# Patient Record
Sex: Male | Born: 1940 | Race: White | Hispanic: No | Smoking: Former smoker
Health system: Southern US, Community
[De-identification: ages and names within clinical notes are randomized; demographics above are authoritative.]

## PROBLEM LIST (undated history)

## (undated) DIAGNOSIS — K635 Polyp of colon: Secondary | ICD-10-CM

## (undated) DIAGNOSIS — I77810 Thoracic aortic ectasia: Secondary | ICD-10-CM

## (undated) DIAGNOSIS — K219 Gastro-esophageal reflux disease without esophagitis: Secondary | ICD-10-CM

## (undated) DIAGNOSIS — H409 Unspecified glaucoma: Secondary | ICD-10-CM

## (undated) DIAGNOSIS — E785 Hyperlipidemia, unspecified: Secondary | ICD-10-CM

## (undated) DIAGNOSIS — R55 Syncope and collapse: Secondary | ICD-10-CM

## (undated) DIAGNOSIS — T4145XA Adverse effect of unspecified anesthetic, initial encounter: Secondary | ICD-10-CM

## (undated) DIAGNOSIS — K5792 Diverticulitis of intestine, part unspecified, without perforation or abscess without bleeding: Secondary | ICD-10-CM

## (undated) DIAGNOSIS — R7301 Impaired fasting glucose: Secondary | ICD-10-CM

## (undated) DIAGNOSIS — N400 Enlarged prostate without lower urinary tract symptoms: Secondary | ICD-10-CM

## (undated) DIAGNOSIS — T8859XA Other complications of anesthesia, initial encounter: Secondary | ICD-10-CM

## (undated) DIAGNOSIS — T7840XA Allergy, unspecified, initial encounter: Secondary | ICD-10-CM

## (undated) DIAGNOSIS — I10 Essential (primary) hypertension: Secondary | ICD-10-CM

## (undated) DIAGNOSIS — I2510 Atherosclerotic heart disease of native coronary artery without angina pectoris: Secondary | ICD-10-CM

## (undated) DIAGNOSIS — R001 Bradycardia, unspecified: Secondary | ICD-10-CM

## (undated) DIAGNOSIS — G4730 Sleep apnea, unspecified: Secondary | ICD-10-CM

## (undated) HISTORY — DX: Syncope and collapse: R55

## (undated) HISTORY — DX: Gastro-esophageal reflux disease without esophagitis: K21.9

## (undated) HISTORY — DX: Essential (primary) hypertension: I10

## (undated) HISTORY — DX: Sleep apnea, unspecified: G47.30

## (undated) HISTORY — DX: Atherosclerotic heart disease of native coronary artery without angina pectoris: I25.10

## (undated) HISTORY — PX: PILONIDAL CYST / SINUS EXCISION: SUR543

## (undated) HISTORY — DX: Bradycardia, unspecified: R00.1

## (undated) HISTORY — DX: Benign prostatic hyperplasia without lower urinary tract symptoms: N40.0

## (undated) HISTORY — DX: Impaired fasting glucose: R73.01

## (undated) HISTORY — DX: Polyp of colon: K63.5

## (undated) HISTORY — DX: Diverticulitis of intestine, part unspecified, without perforation or abscess without bleeding: K57.92

## (undated) HISTORY — DX: Allergy, unspecified, initial encounter: T78.40XA

## (undated) HISTORY — DX: Hyperlipidemia, unspecified: E78.5

## (undated) HISTORY — PX: INTRAOCULAR LENS INSERTION: SHX110

## (undated) HISTORY — PX: ANGIOPLASTY: SHX39

## (undated) HISTORY — DX: Unspecified glaucoma: H40.9

## (undated) HISTORY — PX: COLONOSCOPY: SHX174

---

## 2001-06-08 ENCOUNTER — Ambulatory Visit (HOSPITAL_COMMUNITY): Admission: RE | Admit: 2001-06-08 | Discharge: 2001-06-08 | Payer: Self-pay | Admitting: Gastroenterology

## 2001-06-08 ENCOUNTER — Encounter (INDEPENDENT_AMBULATORY_CARE_PROVIDER_SITE_OTHER): Payer: Self-pay | Admitting: *Deleted

## 2003-08-15 ENCOUNTER — Encounter: Admission: RE | Admit: 2003-08-15 | Discharge: 2003-08-15 | Payer: Self-pay | Admitting: Gastroenterology

## 2004-06-11 ENCOUNTER — Ambulatory Visit: Payer: Self-pay | Admitting: Internal Medicine

## 2004-06-18 ENCOUNTER — Ambulatory Visit: Payer: Self-pay | Admitting: Internal Medicine

## 2004-06-25 ENCOUNTER — Ambulatory Visit: Payer: Self-pay | Admitting: Internal Medicine

## 2004-07-02 ENCOUNTER — Ambulatory Visit: Payer: Self-pay | Admitting: *Deleted

## 2004-07-02 ENCOUNTER — Ambulatory Visit: Payer: Self-pay | Admitting: Internal Medicine

## 2004-07-02 ENCOUNTER — Ambulatory Visit: Payer: Self-pay

## 2004-07-02 ENCOUNTER — Inpatient Hospital Stay (HOSPITAL_COMMUNITY): Admission: RE | Admit: 2004-07-02 | Discharge: 2004-07-04 | Payer: Self-pay | Admitting: *Deleted

## 2004-07-09 ENCOUNTER — Ambulatory Visit: Payer: Self-pay | Admitting: Internal Medicine

## 2004-08-09 ENCOUNTER — Ambulatory Visit: Payer: Self-pay | Admitting: Internal Medicine

## 2004-11-19 ENCOUNTER — Ambulatory Visit: Payer: Self-pay | Admitting: Internal Medicine

## 2004-11-26 ENCOUNTER — Ambulatory Visit: Payer: Self-pay | Admitting: Internal Medicine

## 2005-01-28 ENCOUNTER — Ambulatory Visit: Payer: Self-pay | Admitting: Internal Medicine

## 2005-02-25 ENCOUNTER — Ambulatory Visit: Payer: Self-pay | Admitting: Internal Medicine

## 2005-03-22 ENCOUNTER — Ambulatory Visit: Payer: Self-pay | Admitting: Cardiology

## 2005-03-22 ENCOUNTER — Ambulatory Visit: Payer: Self-pay

## 2005-08-23 ENCOUNTER — Ambulatory Visit: Payer: Self-pay | Admitting: Internal Medicine

## 2005-08-29 ENCOUNTER — Ambulatory Visit: Payer: Self-pay | Admitting: Internal Medicine

## 2006-02-26 ENCOUNTER — Ambulatory Visit: Payer: Self-pay | Admitting: Internal Medicine

## 2006-03-05 ENCOUNTER — Ambulatory Visit: Payer: Self-pay

## 2006-07-03 ENCOUNTER — Ambulatory Visit: Payer: Self-pay | Admitting: Internal Medicine

## 2006-07-03 LAB — CONVERTED CEMR LAB
Albumin: 4.3 g/dL (ref 3.5–5.2)
Alkaline Phosphatase: 81 units/L (ref 39–117)
BUN: 23 mg/dL (ref 6–23)
CO2: 29 meq/L (ref 19–32)
Calcium: 9.5 mg/dL (ref 8.4–10.5)
Chloride: 104 meq/L (ref 96–112)
Creatinine, Ser: 1 mg/dL (ref 0.4–1.5)
Glucose, Bld: 113 mg/dL — ABNORMAL HIGH (ref 70–99)
PSA: 0.64 ng/mL (ref 0.10–4.00)
Total Bilirubin: 0.8 mg/dL (ref 0.3–1.2)
Total CHOL/HDL Ratio: 3
Triglycerides: 90 mg/dL (ref 0–149)
VLDL: 18 mg/dL (ref 0–40)

## 2006-08-29 ENCOUNTER — Ambulatory Visit: Payer: Self-pay | Admitting: Internal Medicine

## 2006-09-05 ENCOUNTER — Ambulatory Visit: Payer: Self-pay | Admitting: Internal Medicine

## 2006-09-05 LAB — CONVERTED CEMR LAB
BUN: 26 mg/dL — ABNORMAL HIGH (ref 6–23)
CO2: 30 meq/L (ref 19–32)
Calcium: 9.3 mg/dL (ref 8.4–10.5)
Chloride: 108 meq/L (ref 96–112)
Creatinine, Ser: 1.1 mg/dL (ref 0.4–1.5)
Hgb A1c MFr Bld: 5.8 % (ref 4.6–6.0)
Potassium: 4.4 meq/L (ref 3.5–5.1)

## 2007-01-09 ENCOUNTER — Ambulatory Visit: Payer: Self-pay | Admitting: Internal Medicine

## 2007-01-09 DIAGNOSIS — I2510 Atherosclerotic heart disease of native coronary artery without angina pectoris: Secondary | ICD-10-CM | POA: Insufficient documentation

## 2007-01-09 DIAGNOSIS — Z860100 Personal history of colon polyps, unspecified: Secondary | ICD-10-CM | POA: Insufficient documentation

## 2007-01-09 DIAGNOSIS — N4889 Other specified disorders of penis: Secondary | ICD-10-CM | POA: Insufficient documentation

## 2007-01-09 DIAGNOSIS — J309 Allergic rhinitis, unspecified: Secondary | ICD-10-CM | POA: Insufficient documentation

## 2007-01-09 DIAGNOSIS — N400 Enlarged prostate without lower urinary tract symptoms: Secondary | ICD-10-CM | POA: Insufficient documentation

## 2007-01-09 DIAGNOSIS — K5730 Diverticulosis of large intestine without perforation or abscess without bleeding: Secondary | ICD-10-CM | POA: Insufficient documentation

## 2007-01-09 DIAGNOSIS — E785 Hyperlipidemia, unspecified: Secondary | ICD-10-CM | POA: Insufficient documentation

## 2007-01-09 DIAGNOSIS — I10 Essential (primary) hypertension: Secondary | ICD-10-CM | POA: Insufficient documentation

## 2007-01-09 DIAGNOSIS — K219 Gastro-esophageal reflux disease without esophagitis: Secondary | ICD-10-CM | POA: Insufficient documentation

## 2007-01-09 LAB — CONVERTED CEMR LAB
ALT: 21 U/L
Albumin: 4.1 g/dL
BUN: 20 mg/dL
CO2: 29 meq/L
Calcium: 9.7 mg/dL
Chloride: 106 meq/L
Cholesterol: 128 mg/dL
Creatinine, Ser: 1 mg/dL
GFR calc Af Amer: 96 mL/min
GFR calc non Af Amer: 79 mL/min
Glucose, Bld: 103 mg/dL — ABNORMAL HIGH
HDL: 39.5 mg/dL
LDL Cholesterol: 74 mg/dL
Phosphorus: 3.3 mg/dL
Potassium: 4.6 meq/L
Sodium: 141 meq/L
Total CHOL/HDL Ratio: 3.2
Triglycerides: 71 mg/dL
VLDL: 14 mg/dL

## 2007-02-12 ENCOUNTER — Ambulatory Visit: Payer: Self-pay | Admitting: Internal Medicine

## 2007-02-19 ENCOUNTER — Ambulatory Visit: Payer: Self-pay

## 2007-02-26 ENCOUNTER — Encounter: Payer: Self-pay | Admitting: Internal Medicine

## 2007-07-31 ENCOUNTER — Ambulatory Visit: Payer: Self-pay | Admitting: Internal Medicine

## 2007-07-31 DIAGNOSIS — R7301 Impaired fasting glucose: Secondary | ICD-10-CM | POA: Insufficient documentation

## 2007-07-31 LAB — CONVERTED CEMR LAB
Blood in Urine, dipstick: NEGATIVE
Ketones, urine, test strip: NEGATIVE
Urobilinogen, UA: 0.2
WBC Urine, dipstick: NEGATIVE

## 2007-10-19 ENCOUNTER — Ambulatory Visit: Payer: Self-pay | Admitting: Internal Medicine

## 2007-10-21 LAB — CONVERTED CEMR LAB
BUN: 28 mg/dL — ABNORMAL HIGH (ref 6–23)
Basophils Absolute: 0 10*3/uL (ref 0.0–0.1)
Bilirubin, Direct: 0.1 mg/dL (ref 0.0–0.3)
CO2: 28 meq/L (ref 19–32)
Chloride: 100 meq/L (ref 96–112)
Eosinophils Absolute: 0.2 10*3/uL (ref 0.0–0.7)
GFR calc Af Amer: 86 mL/min
Glucose, Bld: 107 mg/dL — ABNORMAL HIGH (ref 70–99)
HCT: 40.7 % (ref 39.0–52.0)
HDL: 36.2 mg/dL — ABNORMAL LOW (ref >39.0)
MCV: 92.3 fL (ref 78.0–100.0)
Monocytes Absolute: 0.5 10*3/uL (ref 0.1–1.0)
PSA: 0.57 ng/mL (ref 0.10–4.00)
Platelets: 280 10*3/uL (ref 150–400)
Potassium: 4 meq/L (ref 3.5–5.1)
RDW: 12.9 % (ref 11.5–14.6)
TSH: 0.81 microintl units/mL (ref 0.35–5.50)
Total Bilirubin: 0.8 mg/dL (ref 0.3–1.2)
Total CHOL/HDL Ratio: 5.5
Triglycerides: 70 mg/dL (ref 0–149)
VLDL: 14 mg/dL (ref 0–40)

## 2007-11-20 ENCOUNTER — Ambulatory Visit: Payer: Self-pay | Admitting: Internal Medicine

## 2007-11-22 LAB — CONVERTED CEMR LAB
ALT: 22 units/L (ref 0–53)
AST: 21 units/L (ref 0–37)
Cholesterol: 185 mg/dL (ref 0–200)
HDL: 33.1 mg/dL — ABNORMAL LOW (ref >39.0)
Total Bilirubin: 0.7 mg/dL (ref 0.3–1.2)
Total CK: 86 units/L (ref 7–195)
Triglycerides: 112 mg/dL (ref 0–149)

## 2008-03-07 ENCOUNTER — Ambulatory Visit: Payer: Self-pay | Admitting: Internal Medicine

## 2008-04-25 ENCOUNTER — Ambulatory Visit: Payer: Self-pay | Admitting: Internal Medicine

## 2008-04-25 DIAGNOSIS — L219 Seborrheic dermatitis, unspecified: Secondary | ICD-10-CM | POA: Insufficient documentation

## 2008-04-27 LAB — CONVERTED CEMR LAB
ALT: 26 units/L (ref 0–53)
BUN: 28 mg/dL — ABNORMAL HIGH (ref 6–23)
Bilirubin, Direct: 0.1 mg/dL (ref 0.0–0.3)
Chloride: 102 meq/L (ref 96–112)
Glucose, Bld: 88 mg/dL (ref 70–99)
Phosphorus: 2.9 mg/dL (ref 2.3–4.6)
Potassium: 4 meq/L (ref 3.5–5.1)
Total Bilirubin: 0.9 mg/dL (ref 0.3–1.2)
VLDL: 18 mg/dL (ref 0–40)

## 2008-10-28 ENCOUNTER — Ambulatory Visit: Payer: Self-pay | Admitting: Internal Medicine

## 2008-10-31 LAB — CONVERTED CEMR LAB
AST: 24 units/L (ref 0–37)
Alkaline Phosphatase: 73 units/L (ref 39–117)
Basophils Relative: 0.7 % (ref 0.0–3.0)
CO2: 30 meq/L (ref 19–32)
Calcium: 9.8 mg/dL (ref 8.4–10.5)
Chloride: 103 meq/L (ref 96–112)
Eosinophils Absolute: 0.2 10*3/uL (ref 0.0–0.7)
HCT: 42.9 % (ref 39.0–52.0)
HDL: 42 mg/dL (ref >39.00)
Hemoglobin: 15 g/dL (ref 13.0–17.0)
LDL Cholesterol: 119 mg/dL — ABNORMAL HIGH (ref 0–99)
MCHC: 35.1 g/dL (ref 30.0–36.0)
MCV: 93.5 fL (ref 78.0–100.0)
Monocytes Absolute: 0.6 10*3/uL (ref 0.1–1.0)
Neutro Abs: 2.9 10*3/uL (ref 1.4–7.7)
Potassium: 4.4 meq/L (ref 3.5–5.1)
RBC: 4.59 M/uL (ref 4.22–5.81)
Sodium: 141 meq/L (ref 135–145)
Total Bilirubin: 1 mg/dL (ref 0.3–1.2)
Total CHOL/HDL Ratio: 4

## 2008-11-24 ENCOUNTER — Encounter: Payer: Self-pay | Admitting: Internal Medicine

## 2008-11-25 ENCOUNTER — Ambulatory Visit: Payer: Self-pay | Admitting: Internal Medicine

## 2009-01-09 ENCOUNTER — Encounter: Payer: Self-pay | Admitting: Internal Medicine

## 2009-02-24 ENCOUNTER — Ambulatory Visit: Payer: Self-pay | Admitting: Internal Medicine

## 2009-06-02 ENCOUNTER — Ambulatory Visit: Payer: Self-pay | Admitting: Internal Medicine

## 2009-06-05 LAB — CONVERTED CEMR LAB
Albumin: 4.5 g/dL (ref 3.5–5.2)
Alkaline Phosphatase: 86 units/L (ref 39–117)
Basophils Relative: 0.3 % (ref 0.0–3.0)
CO2: 28 meq/L (ref 19–32)
Chloride: 104 meq/L (ref 96–112)
Creatinine, Ser: 1.1 mg/dL (ref 0.4–1.5)
Eosinophils Relative: 2.3 % (ref 0.0–5.0)
GFR calc non Af Amer: 70.64 mL/min (ref >60)
HCT: 45.6 % (ref 39.0–52.0)
Hemoglobin: 14.9 g/dL (ref 13.0–17.0)
LDL Cholesterol: 116 mg/dL — ABNORMAL HIGH (ref 0–99)
Lymphs Abs: 1.6 10*3/uL (ref 0.7–4.0)
MCV: 96.5 fL (ref 78.0–100.0)
Monocytes Absolute: 0.6 10*3/uL (ref 0.1–1.0)
Monocytes Relative: 8.1 % (ref 3.0–12.0)
Neutro Abs: 4.5 10*3/uL (ref 1.4–7.7)
Platelets: 254 10*3/uL (ref 150.0–400.0)
Potassium: 4 meq/L (ref 3.5–5.1)
RBC: 4.72 M/uL (ref 4.22–5.81)
Total CHOL/HDL Ratio: 4
Triglycerides: 90 mg/dL (ref 0.0–149.0)
VLDL: 18 mg/dL (ref 0.0–40.0)
WBC: 6.9 10*3/uL (ref 4.5–10.5)

## 2009-12-07 ENCOUNTER — Encounter: Payer: Self-pay | Admitting: Internal Medicine

## 2009-12-08 ENCOUNTER — Ambulatory Visit: Payer: Self-pay | Admitting: Internal Medicine

## 2009-12-11 LAB — CONVERTED CEMR LAB
Albumin: 4.3 g/dL (ref 3.5–5.2)
Alkaline Phosphatase: 83 units/L (ref 39–117)
BUN: 22 mg/dL (ref 6–23)
Basophils Relative: 0.6 % (ref 0.0–3.0)
Bilirubin, Direct: 0.1 mg/dL (ref 0.0–0.3)
CO2: 29 meq/L (ref 19–32)
Calcium: 9 mg/dL (ref 8.4–10.5)
Chloride: 99 meq/L (ref 96–112)
Creatinine, Ser: 1 mg/dL (ref 0.4–1.5)
Eosinophils Absolute: 0.2 10*3/uL (ref 0.0–0.7)
GFR calc non Af Amer: 75.25 mL/min (ref >60)
HDL: 45.8 mg/dL (ref >39.00)
Hemoglobin: 14.7 g/dL (ref 13.0–17.0)
Hgb A1c MFr Bld: 5.9 % (ref 4.6–6.5)
LDL Cholesterol: 130 mg/dL — ABNORMAL HIGH (ref 0–99)
Lymphocytes Relative: 24.6 % (ref 12.0–46.0)
MCHC: 34.2 g/dL (ref 30.0–36.0)
Neutrophils Relative %: 59.7 % (ref 43.0–77.0)
Total CHOL/HDL Ratio: 4
Total Protein: 7.2 g/dL (ref 6.0–8.3)
VLDL: 13.2 mg/dL (ref 0.0–40.0)

## 2009-12-12 ENCOUNTER — Telehealth (INDEPENDENT_AMBULATORY_CARE_PROVIDER_SITE_OTHER): Payer: Self-pay | Admitting: Radiology

## 2009-12-13 ENCOUNTER — Ambulatory Visit: Payer: Self-pay

## 2009-12-13 ENCOUNTER — Ambulatory Visit: Payer: Self-pay | Admitting: Cardiovascular Disease

## 2009-12-13 ENCOUNTER — Encounter: Payer: Self-pay | Admitting: Cardiovascular Disease

## 2009-12-13 ENCOUNTER — Encounter (HOSPITAL_COMMUNITY): Admission: RE | Admit: 2009-12-13 | Discharge: 2010-01-30 | Payer: Self-pay | Admitting: Internal Medicine

## 2009-12-25 ENCOUNTER — Ambulatory Visit: Payer: Self-pay | Admitting: Internal Medicine

## 2010-01-01 ENCOUNTER — Telehealth: Payer: Self-pay | Admitting: Internal Medicine

## 2010-06-12 NOTE — Assessment & Plan Note (Signed)
Summary: f69m  Medications Added AMLODIPINE BESYLATE 5 MG TABS (AMLODIPINE BESYLATE) Take one tablet by mouth daily ZETIA 10 MG TABS (EZETIMIBE) Take one tablet by mouth daily.      Allergies Added:   Visit Type:  Follow-up Primary Provider:  Cindee Salt MD  CC:  SOB.  History of Present Illness: Budd is a delightful 70 year old male with a history of coronary artery disease status post stenting of the right coronary artery and PDA with Taxus drug-eluting stents in 2006.  He also has a history of hypertension, hyperlipidemia, and glucose intolerance. He is unable tolerate beta-blockers due to symptomatic bradycardia.    He had ETT/Myoview last week for SOB and chest tightness. Walked 12:37. EF 57% No ischemia or scar.  Returns today for routine f/u. Saw Dr. Alphonsus Sias recently c/o CP. Had Myoview as above - normal.   Walking 3-4 miles regularly without problem. 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 Feels chest stuffiness may be related to dust inhalation. No problems with medicine.   CoQ10 helping him tolerate statin. Recent lipids LDL 130 (up from 119) HDL 46. Pravastatin doubled last year. Unable to tolerate higher potency statins. Not watching diet closely.  Current Medications (verified): 1)  Lisinopril-Hydrochlorothiazide 20-12.5 Mg  Tabs (Lisinopril-Hydrochlorothiazide) .... Take 2 By Mouth Daily 2)  Pravachol 40 Mg Tabs (Pravastatin Sodium) .... Take 1 Tablet By Mouth Once Daily 3)  Amlodipine Besylate 2.5 Mg Tabs (Amlodipine Besylate) .... Take 1 Tablet By Mouth Once Daily 4)  Aspirin 81 Mg Tbec (Aspirin) .Marland Kitchen.. 1 Daily 5)  Viagra 100 Mg Tabs (Sildenafil Citrate) .... 1/2-1 Tab About 30 Minutes Before Sex 6)  Triamcinolone Acetonide 0.1 % Crea (Triamcinolone Acetonide) .... Use Two Times A Day As Needed For Scaly Rash 7)  Vitamin C 1000 Mg Tabs (Ascorbic Acid) .... Take 1 By Mouth Once Daily 8)  Fish Oil 1000 Mg Caps (Omega-3 Fatty Acids) .... Take 1 By Mouth  Once Daily 9)  Co Q10 100 Mg Tabs (Coenzyme Q10) .... Take 1 By Mouth Once Daily  Allergies (verified): 1)  ! Lipitor (Atorvastatin Calcium) 2)  Vytorin (Ezetimibe-Simvastatin)  Past History:  Past Medical History: Coronary artery disease s/p stent    --s/p Taxus DES to RCA and PDA in 2006    --Myoview 8/11: Walked 12:37. EF 57%. no ischemia or scar Hypertension Hyperlipidemia Bradycardia Allergic rhinitis GERD Colonic polyps--tubular adenoma Diverticulosis Benign prostatic hypertrophy Sleep apnea Impaired fasting glucose  Review of Systems       As per HPI and past medical history; otherwise all systems negative.   Vital Signs:  Patient profile:   69 year old male Height:      67 inches Weight:      185 pounds BMI:     29.08 Pulse rate:   65 / minute BP sitting:   138 / 74  (left arm) Cuff size:   regular  Vitals Entered By: Hardin Negus, RMA (December 25, 2009 10:52 AM)  Physical Exam  General:  Well appearing. no resp difficulty HEENT: normal Neck: supple. no JVD. Carotids 2+ bilat; no bruits. No lymphadenopathy or thryomegaly appreciated. Cor: PMI nondisplaced. Regular rate & rhythm. No rubs, gallops, murmur. Lungs: clear Abdomen: soft, nontender, nondistended. No hepatosplenomegaly. No bruits or masses. Good bowel sounds. Extremities: no cyanosis, clubbing, rash, edema Neuro: alert & orientedx3, cranial nerves grossly intact. moves all 4 extremities w/o difficulty. affect pleasant    Impression & Recommendations:  Problem # 1:  CORONARY ARTERY DISEASE (  ICD-414.00) Stable. Recent nuclear test very reassuring. Will get CXR to further evalaute SOB.  Problem # 2:  HYPERLIPIDEMIA (ICD-272.4) LDL goal < 70. Unable to tolerate hiher potency statins. Will add zetia 10.   Problem # 3:  HYPERTENSION (ICD-401.9) BP high end of rormal. Had HTN response to exercise. Will increase amlodipine to 5mg  once daily.   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 Take 2 by mouth daily    Amlodipine Besylate 5 Mg Tabs (Amlodipine besylate) .Marland Kitchen... Take one tablet by mouth daily    Aspirin 81 Mg Tbec (Aspirin) .Marland Kitchen... 1 daily  His updated medication list for this problem includes:    Lisinopril-hydrochlorothiazide 20-12.5 Mg Tabs (Lisinopril-hydrochlorothiazide) .Marland Kitchen... Take 2 by mouth daily    Amlodipine Besylate 5 Mg Tabs (Amlodipine besylate) .Marland Kitchen... Take one tablet by mouth daily    Aspirin 81 Mg Tbec (Aspirin) .Marland Kitchen... 1 daily  Orders: T-2 View CXR (71020TC)  Patient Instructions: 1)  Increase Amlodipine to 5mg  daily 2)  Start Zetia 10mg  daily 3)  A chest x-ray takes a picture of the organs and structures inside the chest, including the heart, lungs, and blood vessels. This test can show several things, including, whether the heart is enlarged; whether fluid is building up in the lungs; and whether pacemaker / defibrillator leads are still in place. 4)  Your physician wants you to follow-up in:  1 yaer.  You will receive a reminder letter in the mail two months in advance. If you don't receive a letter, please call our office to schedule the follow-up appointment. Prescriptions: ZETIA 10 MG TABS (EZETIMIBE) Take one tablet by mouth daily.  #30 x 6   Entered by:   Meredith Staggers, RN   Authorized by:   Dolores [name], MD, [facility]   Signed by:   Meredith Staggers, RN on 12/25/2009   Method used:   Electronically to        [facility]* (retail)       [address]       [place], [place]  [address]       Ph: [phone]       Fax: [phone]   RxID:   [id number] AMLODIPINE BESYLATE 5 MG TABS (AMLODIPINE BESYLATE) Take one tablet by mouth daily  #30 x 6   Entered by:   Meredith Staggers, RN   Authorized by:   Dolores [name], MD, [facility]   Signed by:   Meredith Staggers, RN on 12/25/2009   Method used:   Electronically to         [facility]* (retail)       [address]       [place], [place]  [address]       Ph: [phone]       Fax: [phone]   RxID:   [id number]

## 2010-06-12 NOTE — Assessment & Plan Note (Signed)
Summary: 6 MONTH FOLLOW UP/[facility]H   Vital Signs:  Patient profile:   70 year old male Weight:      184.25 pounds BMI:     29.85 Temp:     98.0 degrees F oral Pulse rate:   60 / minute Pulse rhythm:   regular BP sitting:   124 / 78  (left arm) Cuff size:   large  Vitals Entered By: Janee Morn CMA (December 08, 2009 7:59 AM)  Nutrition Counseling: Patient's BMI is greater than 25 and therefore counseled on weight management options. CC: 6 month follow up   History of Present Illness: Doing okay Down to 3-4 yards per week Only drives occ  Did 50 mile hike with scouts in Spring got dehydrated first day and had to come home hasn't built back up since then Gets easier DOE than before he had his stents No pain with physcial work----- but occ heaviness on chest with stress No palpitations Gets brief feeling of ready to pass out---no syncope though continues to work out in the heat  Voiding okay Nocturia x 1 generally  No headaches No edema  Eats at [employer] mostly not particularly careful discussed proper choices still walks regularly  Allergies: 1)  ! Lipitor (Atorvastatin Calcium) 2)  Vytorin (Ezetimibe-Simvastatin)  Review of Systems       Has cataracts now--eyes get tired by the end of the day weight is stable sleeps okay  Physical Exam  General:  alert and normal appearance.   Mouth:  mild irritation and slight swelling of lips Neck:  supple, no masses, no thyromegaly, no carotid bruits, and no cervical lymphadenopathy.   Lungs:  normal respiratory effort, no intercostal retractions, no accessory muscle use, and normal breath sounds.   Heart:  normal rate, regular rhythm, no murmur, and no gallop.   Abdomen:  soft and non-tender.   Extremities:  no edema Skin:  no rashes and no suspicious lesions.   Psych:  normally interactive, good eye contact, not anxious appearing, and not depressed appearing.     Impression &  Recommendations:  Problem # 1:  CHEST PAIN (ICD-786.50) Assessment Comment Only  stress related chest heaviness  needs stress test now EKG okay  Orders: TLB-Renal Function Panel (80069-RENAL) TLB-CBC Platelet - w/Differential (85025-CBCD) TLB-Hepatic/Liver Function Pnl (80076-HEPATIC) TLB-TSH (Thyroid Stimulating Hormone) (84443-TSH) Venipuncture (25427) Cardiolite (Cardiolite)  Problem # 2:  HYPERTENSION (ICD-401.9) Assessment: Unchanged good control no changes needed  His updated medication list for this problem includes:    Lisinopril-hydrochlorothiazide 20-12.5 Mg Tabs (Lisinopril-hydrochlorothiazide) .Marland Kitchen... Take 2 by mouth daily    Amlodipine Besylate 2.5 Mg Tabs (Amlodipine besylate) .Marland Kitchen... Take 1 tablet by mouth once daily  BP today: 124/78 Prior BP: 138/80 (06/02/2009)  Labs Reviewed: K+: 4.0 (06/02/2009) Creat: : 1.1 (06/02/2009)   Chol: 178 (06/02/2009)   HDL: 44.40 (06/02/2009)   LDL: 116 (06/02/2009)   TG: 90.0 (06/02/2009)  Problem # 3:  HYPERLIPIDEMIA (ICD-272.4) Assessment: Unchanged  due for labs would increase dose if stress test not reassuring (in addition to further evaluation)  His updated medication list for this problem includes:    Pravachol 40 Mg Tabs (Pravastatin sodium) .Marland Kitchen... Take 1 tablet by mouth once daily  Labs Reviewed: SGOT: 22 (06/02/2009)   SGPT: 23 (06/02/2009)   HDL:44.40 (06/02/2009), 42.00 (10/28/2008)  LDL:116 (06/02/2009), 119 (10/28/2008)  Chol:178 (06/02/2009), 173 (10/28/2008)  Trig:90.0 (06/02/2009), 59.0 (10/28/2008)  Orders: TLB-Lipid Panel (80061-LIPID) TLB-A1C / Hgb A1C (Glycohemoglobin) (83036-A1C)  Problem # 4:  BENIGN PROSTATIC HYPERTROPHY (ICD-600.00) Assessment: Unchanged voiding okay  Complete Medication List: 1)  Lisinopril-hydrochlorothiazide 20-12.5 Mg Tabs (Lisinopril-hydrochlorothiazide) .... Take 2 by mouth daily 2)  Pravachol 40 Mg Tabs (Pravastatin sodium) .... Take 1 tablet by mouth once daily 3)   Amlodipine Besylate 2.5 Mg Tabs (Amlodipine besylate) .... Take 1 tablet by mouth once daily 4)  Aspirin 81 Mg Tbec (Aspirin) .Marland Kitchen.. 1 daily 5)  Viagra 100 Mg Tabs (Sildenafil citrate) .... 1/2-1 tab about 30 minutes before sex 6)  Triamcinolone Acetonide 0.1 % Crea (Triamcinolone acetonide) .... Use two times a day as needed for scaly rash 7)  Vitamin C 1000 Mg Tabs (Ascorbic acid) .... Take 1 by mouth once daily 8)  Fish Oil 1000 Mg Caps (Omega-3 fatty acids) .... Take 1 by mouth once daily 9)  Co Q10 100 Mg Tabs (Coenzyme q10) .... Take 1 by mouth once daily  Other Orders: TLB-PSA (Prostate Specific Antigen) (84153-PSA)  Patient Instructions: 1)  Please schedule a follow-up appointment in 6 months .  2)  Please set up stress test  Current Allergies (reviewed today): ! LIPITOR (ATORVASTATIN CALCIUM) VYTORIN (EZETIMIBE-SIMVASTATIN)    EKG  Procedure date:  12/08/2009  Findings:      sinus bradycardia at 50 Normal otherwise

## 2010-06-12 NOTE — Assessment & Plan Note (Signed)
Summary: Cardiology Nuclear Testing  Nuclear Med Background Indications for Stress Test: Evaluation for Ischemia, Stent Patency   History: Echo, Heart Catheterization, Myocardial Perfusion Study, Stents  History Comments: '06 Echo:normal; '06 Stent-RCA, PDA; 2/06  10/08 MPS:EF=59%, no ischemia or scar  Symptoms: Chest Pressure, Dizziness, DOE, Fatigue, Near Syncope  Symptoms Comments: Last episode of WJ:XBJY week.   Nuclear Pre-Procedure Cardiac Risk Factors: Family History - CAD, History of Smoking, Hypertension, Lipids, NIDDM Caffeine/Decaff Intake: none NPO After: 6:30 PM Lungs: Clear IV 0.9% NS with Angio Cath: 22g     IV Site: (R) hand IV Started by: Irean Hong RN Chest Size (in) 42-44     Height (in): 67 Weight (lb): 180 BMI: 28.29  Nuclear Med Study 1 or 2 day study:  1 day     Stress Test Type:  Stress Reading MD:  Charlton Haws, MD     Referring MD:  Tillman Abide, MD Resting Radionuclide:  Technetium 79m Tetrofosmin     Resting Radionuclide Dose:  11.0 mCi  Stress Radionuclide:  Technetium 46m Tetrofosmin     Stress Radionuclide Dose:  33.0 mCi   Stress Protocol Exercise Time (min):  12:37 min     Max HR:  144 bpm     Predicted Max HR:  151 bpm  Max Systolic BP: 200 mm Hg     Percent Max HR:  95.36 %     METS: 14.5 Rate Pressure Product:  78295    Stress Test Technologist:  Rea [employer] CMA-N     Nuclear Technologist:  Domenic Polite CNMT  Rest Procedure  Myocardial perfusion imaging was performed at rest 45 minutes following the intravenous administration of Myoview Technetium 60m Tetrofosmin.  Stress Procedure  The patient exercised for 12:37.  The patient stopped due to fatigue and denied any chest pain.  There were no significant ST-T wave changes.  There was a mild hypertensive response, 200/82.  Myoview was injected at peak exercise and myocardial perfusion imaging was performed after a brief delay.  QPS Raw Data Images:  Normal; no motion artifact;  normal heart/lung ratio. Stress Images:  NI: Uniform and normal uptake of tracer in all myocardial segments. Rest Images:  Normal homogeneous uptake in all areas of the myocardium. Subtraction (SDS):  SDS 2 Transient Ischemic Dilatation:  .92  (Normal <1.22)  Lung/Heart Ratio:  .29  (Normal <0.45)  Quantitative Gated Spect Images QGS EDV:  100 ml QGS ESV:  41 ml QGS EF:  59 % QGS cine images:  normal  Findings Normal nuclear study      Overall Impression  Exercise Capacity: Good exercise capacity. BP Response: Hypertensive blood pressure response. Clinical Symptoms: Fatigue ECG Impression: No significant ST segment change suggestive of ischemia. Overall Impression: Normal stress nuclear study.  Appended Document: Cardiology Nuclear Testing Please call The stress test was normal Very reassuring---he probably just overdid it with the hike with the scouts  Appended Document: Cardiology Nuclear Testing Left message on machine notifying him of results

## 2010-06-12 NOTE — Progress Notes (Signed)
Summary: Nuc pre-procedure  Phone Note Outgoing Call   Call placed by: Edwyna Perfect, RN Summary of Call: Left message with information on Myoview Information Sheet (see scanned document for details).      Nuclear Med Background Indications for Stress Test: Evaluation for Ischemia   History: Echo, Heart Catheterization, Myocardial Perfusion Study, Stents  History Comments: Sleep apnea. 06 Echo normal 06 Cath mod high grade RCA residual and N/O LAD or CFX 02/06 Stent of RCA and PDA 10/08 MPS EF= 59% no ischemia or scar  Symptoms: Chest Pressure, DOE, Near Syncope  Symptoms Comments: Chest pressure with stress   Nuclear Pre-Procedure Cardiac Risk Factors: Family History - CAD, Hypertension, Lipids, NIDDM Height (in): 66

## 2010-06-12 NOTE — Assessment & Plan Note (Signed)
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 Lipitor (Atorvastatin Calcium) 2)  Vytorin (Ezetimibe-Simvastatin)  Past History:  Past medical, surgical, family and social histories (including risk factors) reviewed for relevance to current acute and chronic problems.  Past Medical History: Reviewed history from 02/24/2009 and no changes required. Allergic rhinitis Colonic polyps--tubular adenoma Coronary artery disease s/p stent Bradycarida Diverticulosis GERD Hyperlipidemia Hypertension Benign prostatic hypertrophy Sleep apnea Impaired fasting glucose  Past Surgical History: Reviewed history from 06/05/2007 and no changes required. Pneumonia (1998) Pilonidal cyst (1960's) Syncipe (04/1998) Sleep study (01/1997) Stents- RCA, PDA--normal LV function (06/2004) Myoview stress- normal  EF 63% (02/2006)  Family History: Reviewed history from 06/05/2007 and no changes required. Dad with CAD Mom 3 brothers--1 brother with MI, 1 wiht jaw cancer (smoker) No HTN or DM  Social History: Reviewed history from 10/28/2008 and no changes required. 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   Neck:  supple, no masses, no thyromegaly, no carotid bruits, and no cervical lymphadenopathy.   Lungs:  normal respiratory effort and normal breath sounds.   Heart:  normal rate, regular rhythm, no murmur, and no gallop.   Abdomen:  soft and non-tender.   Pulses:  1+ in feet Extremities:  no edema Skin:  no rashes and no suspicious lesions.   Psych:  normally interactive, good eye contact, not anxious appearing, and not depressed appearing.     Impression & Recommendations:  Problem # 1:  CORONARY ARTERY DISEASE (ICD-414.00) Assessment Improved exercise tolerance actually better won't give nitro Rx--hasn't needed and giving viagra to try  His updated medication list for this problem includes:    Lisinopril-hydrochlorothiazide 20-12.5 Mg Tabs (Lisinopril-hydrochlorothiazide) .Marland Kitchen... Take 2 by mouth daily    Amlodipine Besylate 2.5 Mg Tabs (Amlodipine besylate) .Marland Kitchen... Take 1 tablet by mouth once daily    Aspirin 81 Mg Tbec (Aspirin) .Marland Kitchen... 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 Take 2 by mouth daily    Amlodipine Besylate 2.5 Mg Tabs (Amlodipine besylate) .Marland Kitchen... Take 1 tablet by mouth  once daily  BP today: 138/80 Prior BP: 130/78 (02/24/2009)  Labs Reviewed: K+: 4.4 (10/28/2008) Creat: : 1.1 (10/28/2008)   Chol: 173 (10/28/2008)   HDL: 42.00 (10/28/2008)   LDL: 119 (10/28/2008)    TG: 59.0 (10/28/2008)  Orders: TLB-Renal Function Panel (80069-RENAL) TLB-CBC Platelet - w/Differential (85025-CBCD) TLB-TSH (Thyroid Stimulating Hormone) (84443-TSH)  Problem # 4:  HYPERLIPIDEMIA (ICD-272.4) Assessment: Unchanged  can't tolerate higher dose  His updated medication list for this problem includes:    Pravachol 40 Mg Tabs (Pravastatin sodium) .Marland Kitchen... Take 1 tablet by mouth once daily  Labs Reviewed: SGOT: 24 (10/28/2008)   SGPT: 22 (10/28/2008)   HDL:42.00 (10/28/2008), 44.5 (04/25/2008)  LDL:119 (10/28/2008), 130 (04/25/2008)  Chol:173 (10/28/2008), 193 (04/25/2008)  Trig:59.0 (10/28/2008), 91 (04/25/2008)  Orders: TLB-Lipid Panel (80061-LIPID) TLB-Hepatic/Liver Function Pnl (80076-HEPATIC) Venipuncture (63875)  Problem # 5:  BENIGN PROSTATIC HYPERTROPHY (ICD-600.00)  Complete Medication List: 1)  Lisinopril-hydrochlorothiazide 20-12.5 Mg Tabs (Lisinopril-hydrochlorothiazide) .... Take 2 by mouth daily 2)  Pravachol 40 Mg Tabs (Pravastatin sodium) .... Take 1 tablet by mouth once daily 3)  Amlodipine Besylate 2.5 Mg Tabs (Amlodipine besylate) .... Take 1 tablet by mouth once daily 4)  Aspirin 81 Mg Tbec (Aspirin) .Marland Kitchen.. 1 daily 5)  Triamcinolone Acetonide 0.1 % Crea (Triamcinolone acetonide) .... Use two times a day as needed for scaly rash 6)  Vitamin C 1000 Mg Tabs (Ascorbic acid) .... Take 1 by mouth once daily 7)  Fish Oil 1000 Mg Caps (Omega-3 fatty acids) .... Take 1 by mouth once daily 8)  Co Q10 100 Mg Tabs (Coenzyme q10) .... Take 1 by mouth once daily 9)  Viagra 100 Mg Tabs (Sildenafil citrate) .... 1/2-1 tab about 30 minutes before sex  Patient Instructions: 1)  Please schedule a follow-up appointment in 6 months .  Prescriptions: VIAGRA 100 MG TABS (SILDENAFIL CITRATE) 1/2-1 tab about 30 minutes before sex  #6 x 3   Entered and Authorized by:   Cindee Salt MD   Signed by:   Cindee Salt MD on 06/02/2009   Method used:   Electronically to         [facility]* (retail)       [address]       [place], [place]  [address]       Ph: [phone]       Fax: [phone]   RxID:   [id number]   Current Allergies (reviewed today): ! LIPITOR (ATORVASTATIN CALCIUM) VYTORIN (EZETIMIBE-SIMVASTATIN)

## 2010-06-12 NOTE — Progress Notes (Signed)
Summary: test result   Phone Note Call from Patient Call back at Home Phone [phone] Call back at [phone] cell phone    Caller: Patient Reason for Call: Talk to Doctor, Lab or Test Results Initial call taken by: Lorne Skeens,  January 01, 2010 12:34 PM  Follow-up for Phone Call        pt given xray results Meredith Staggers, RN  January 01, 2010 1:25 PM

## 2010-06-22 ENCOUNTER — Encounter: Payer: Self-pay | Admitting: Internal Medicine

## 2010-06-22 ENCOUNTER — Ambulatory Visit (INDEPENDENT_AMBULATORY_CARE_PROVIDER_SITE_OTHER): Payer: MEDICARE | Admitting: Internal Medicine

## 2010-06-22 DIAGNOSIS — I10 Essential (primary) hypertension: Secondary | ICD-10-CM

## 2010-06-22 DIAGNOSIS — E785 Hyperlipidemia, unspecified: Secondary | ICD-10-CM

## 2010-06-22 DIAGNOSIS — I2510 Atherosclerotic heart disease of native coronary artery without angina pectoris: Secondary | ICD-10-CM

## 2010-06-22 DIAGNOSIS — N400 Enlarged prostate without lower urinary tract symptoms: Secondary | ICD-10-CM

## 2010-06-22 DIAGNOSIS — K219 Gastro-esophageal reflux disease without esophagitis: Secondary | ICD-10-CM

## 2010-06-25 LAB — CONVERTED CEMR LAB
Alkaline Phosphatase: 99 units/L (ref 39–117)
Bilirubin, Direct: 0.1 mg/dL (ref 0.0–0.3)
Indirect Bilirubin: 0.4 mg/dL (ref 0.0–0.9)
LDL Cholesterol: 96 mg/dL (ref 0–99)
Total Bilirubin: 0.5 mg/dL (ref 0.3–1.2)
Triglycerides: 180 mg/dL — ABNORMAL HIGH (ref <150)

## 2010-06-28 NOTE — Assessment & Plan Note (Signed)
Summary: 10/26 mailed r/s ltr to [place]   Vital Signs:  Patient profile:   70 year old male Weight:      187 pounds Temp:     97.9 degrees F oral Pulse rate:   66 / minute Pulse rhythm:   regular BP sitting:   124 / 56  (left arm) Cuff size:   large  Vitals Entered By: Mervin Hack CMA Duncan Dull) (June 22, 2010 2:17 PM) CC: follow-up   History of Present Illness: Doing okay In [place] for a couple of weeks on building trip has had cold--coughing up some mucus (swallowing) No fever No SOB  heart has been fine Good exercise tolerance No chest pain Not as regular with exercise but still works hard No change in stamina---okay with 4-5 mile walks  stomach has been quiet No burning Not needing meds  seems to tolerate the zetia okay does note a dry cough---on lisinopril for a long time  Voids okay NO daytime problems Nocturia x 1-2   Allergies: 1)  ! Lipitor (Atorvastatin Calcium) 2)  Vytorin (Ezetimibe-Simvastatin)  Past History:  Past medical, surgical, family and social histories (including risk factors) reviewed for relevance to current acute and chronic problems.  Past Medical History: Reviewed history from 12/25/2009 and no changes required. Coronary artery disease s/p stent    --s/p Taxus DES to RCA and PDA in 2006    --Myoview 8/11: Walked 12:37. EF 57%. no ischemia or scar Hypertension Hyperlipidemia Bradycardia Allergic rhinitis GERD Colonic polyps--tubular adenoma Diverticulosis Benign prostatic hypertrophy Sleep apnea Impaired fasting glucose  Past Surgical History: Reviewed history from 12/22/2009 and no changes required.  percutaneous transluminal coronary  angioplasty and stenting of the right coronary artery and posterior PDA  with Taxus drug-eluting stents February 2006. Pneumonia (1998) Pilonidal cyst (1960's) Syncipe (04/1998) Sleep study (01/1997) Myoview stress- normal  EF 63%  (02/2006)  Family History: Reviewed history from 12/22/2009 and no changes required. Dad with CAD Mom 3 brothers--1 brother with MI, 1 wiht jaw cancer (smoker) No HTN or DM  Social History: Reviewed history from 12/22/2009 and no changes required. 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   Neck:  supple, no masses, and no cervical lymphadenopathy.   Lungs:  normal respiratory effort, no intercostal retractions, no accessory muscle use, and normal breath sounds.   Heart:  normal rate, regular rhythm, no murmur, and no gallop.   Abdomen:  soft and non-tender.   Extremities:  no edema Psych:  normally interactive, good eye contact, not anxious appearing, and not depressed appearing.     Impression & Recommendations:  Problem # 1:  HYPERTENSION (ICD-401.9) Assessment Unchanged good control no changes needed  His updated medication list for this problem includes:    Lisinopril-hydrochlorothiazide 20-12.5 Mg Tabs (Lisinopril-hydrochlorothiazide) .Marland Kitchen... Take 2 by mouth daily    Amlodipine Besylate 5 Mg Tabs (Amlodipine besylate) .Marland Kitchen... Take one tablet by mouth daily  BP today: 124/56 Prior BP: 138/74 (12/25/2009)  Labs Reviewed: K+: 4.4 (12/08/2009) Creat: : 1.0 (12/08/2009)   Chol: 189 (12/08/2009)   HDL: 45.80 (12/08/2009)   LDL: 130 (12/08/2009)   TG: 66.0 (12/08/2009)  Problem # 2:  BENIGN PROSTATIC HYPERTROPHY (ICD-600.00) Assessment: Unchanged doing okay without meds now  Problem # 3:  GERD (ICD-530.81) Assessment: Unchanged stable without meds  Problem # 4:  CORONARY ARTERY DISEASE (ICD-414.00) Assessment: Unchanged no symptoms good exercise tolerance  His  updated medication list for this problem includes:    Lisinopril-hydrochlorothiazide 20-12.5 Mg Tabs (Lisinopril-hydrochlorothiazide) .Marland Kitchen... Take 2 by mouth daily    Amlodipine Besylate 5 Mg Tabs (Amlodipine besylate) .Marland Kitchen... Take one tablet by mouth daily    Aspirin 81 Mg Tbec (Aspirin) .Marland Kitchen... 1 daily  Labs Reviewed: Chol: 189 (12/08/2009)   HDL: 45.80 (12/08/2009)   LDL: 130 (12/08/2009)   TG: 66.0 (12/08/2009)  Problem # 5:  HYPERLIPIDEMIA (ICD-272.4) Assessment: Unchanged  will check labs on the zetia also  His updated medication list for this problem includes:    Zetia 10 Mg Tabs (Ezetimibe) .Marland Kitchen... Take one tablet by mouth daily.    Pravachol 40 Mg Tabs (Pravastatin sodium) .Marland Kitchen... Take 1 tablet by mouth once daily  Orders: Venipuncture (21308) Specimen Handling (65784) T-Lipid Profile (69629-52841) T-Hepatic Function [phone])  Labs Reviewed: SGOT: 21 (12/08/2009)   SGPT: 18 (12/08/2009)   HDL:45.80 (12/08/2009), 44.40 (06/02/2009)  LDL:130 (12/08/2009), 116 (06/02/2009)  Chol:189 (12/08/2009), 178 (06/02/2009)  Trig:66.0 (12/08/2009), 90.0 (06/02/2009)  Complete Medication List: 1)  Zetia 10 Mg Tabs (Ezetimibe) .... Take one tablet by mouth daily. 2)  Lisinopril-hydrochlorothiazide 20-12.5 Mg Tabs (Lisinopril-hydrochlorothiazide) .... Take 2 by mouth daily 3)  Pravachol 40 Mg Tabs (Pravastatin sodium) .... Take 1 tablet by mouth once daily 4)  Amlodipine Besylate 5 Mg Tabs (Amlodipine besylate) .... Take one tablet by mouth daily 5)  Aspirin 81 Mg Tbec (Aspirin) .Marland Kitchen.. 1 daily 6)  Fish Oil 1000 Mg Caps (Omega-3 fatty acids) .... Take 1 by mouth once daily 7)  Co Q10 100 Mg Tabs (Coenzyme q10) .... Take 1 by mouth once daily 8)  Vitamin C 1000 Mg Tabs (Ascorbic acid) .... Take 1 by mouth once daily  Patient Instructions: 1)  Please schedule a follow-up appointment in 6 months for physical   Orders Added: 1)  Est. Patient Level IV [53664] 2)  Venipuncture [40347] 3)   Specimen Handling [99000] 4)  T-Lipid Profile [80061-22930] 5)  T-Hepatic Function [42595-63875]    Current Allergies (reviewed today): ! LIPITOR (ATORVASTATIN CALCIUM) VYTORIN (EZETIMIBE-SIMVASTATIN)

## 2010-09-25 NOTE — Assessment & Plan Note (Signed)
[facility]                            CARDIOLOGY OFFICE NOTE   Patrick [name], Patrick [name]                       MRN:          [id number]  DATE:02/12/2007                            DOB:          10-10-1940    PRIMARY CARE PHYSICIAN:  Karie Schwalbe, M.D.   INTERVAL HISTORY:  The patient is a delightful 70 year old male who has  a history of coronary artery disease, status post previous PTCA and  stenting of the right coronary artery and PDA with Taxus drug-eluting  stents in 2006.  He also has a history of hypertension, hyperlipidemia,  and glucose intolerance.  He was previously on a beta blocker, but this  had to be stopped due to symptomatic bradycardia.   He returns today.  He is doing fairly well.  He continues to walk 2  miles three days a week.  However, over the past few months, he has  noticed that he gets a little bit more short of breath with walking.  He  has not had any chest pain, no heart failure symptoms.   CURRENT MEDICATIONS:  1. Triamterene/hydrochlorothiazide 37.5/25 half tablet a day.  2. Claritin.  3. Prilosec.  4. Plavix 75 mg a day.  5. Aspirin 81 mg a day.  6. Diovan 320 mg a day.  7. Fish Oil 1200 mg a day.  8. Vytorin 10/40 mg daily.   PHYSICAL EXAMINATION:  GENERAL:  He is well-appearing and in no acute  distress.  VITAL SIGNS:  Blood pressure 128/74, he says his blood pressure has been  running 130 to 135 at home.  Heart rate 60, weight 181.  HEENT:  Normal.  NECK:  Supple.  There is no JVD.  Carotids are 2+ bilaterally without  any bruits.  There is no lymphadenopathy or thyromegaly.  HEART:  PMI is not displaced.  He has a regular rate and rhythm.  No  murmurs, rubs, or gallops.  LUNGS:  Clear.  ABDOMEN:  Soft, nontender, nondistended.  There is mild central obesity.  No hepatosplenomegaly, no bruits, no masses, good bowel sounds.  EXTREMITIES:  Warm with no cyanosis, clubbing, or edema.  No rash.  NEUROLOGY:   He is alert and oriented x3.  Cranial nerves II-XII grossly  intact.  He moves all four extremities without difficulty.  Affect is  appropriate.   EKG shows normal sinus rhythm with a rate of 60.  There is a nonspecific  intraventricular conduction delay at 118 milliseconds.  No significant  ST-T wave abnormalities.   ASSESSMENT:  1. Coronary artery disease.  Overall, he is doing fairly well.      However, he has had some exertional dyspnea.  We will proceed with      a treadmill Cardiolite to further evaluate.  2. Hypertension.  Blood pressure is mildly elevated.  Given the fact      that he would like to be on all generics if possible, we will try      to clean up his regimen a bit.  We will switch him over to  Lisinopril/hydrochlorothiazide 20/12.5 b.i.d.  We will stop his      Maxzide and Diovan.  We will also add Norvasc 2.5 mg a day and      follow his blood pressure closely.  3. Hyperlipidemia.  Most recent LDL was 74.  This is very close to      goal.  We will continue current therapy.   DISPOSITION:  Pending the results of his stress test.  If this is  normal, we will see him back in six months.  If it is abnormal, I  suspect he will need a heart catheterization to further evaluate.     Bevelyn Buckles. Bensimhon, MD  Electronically Signed    DRB/MedQ  DD: 02/12/2007  DT: 02/13/2007  Job #: [id number]   cc:   Karie Schwalbe, MD

## 2010-09-25 NOTE — Assessment & Plan Note (Signed)
[facility]                            CARDIOLOGY OFFICE NOTE   CHAYTON, MURATA                       MRN:          [id number]  DATE:03/07/2008                            DOB:          20-May-1940    PRIMARY CARE PHYSICIAN:  Karie Schwalbe, MD.   INTERVAL HISTORY:  Mr. Hubbert is a delightful 70 year old male with a  history of coronary artery disease status post stenting of the right  coronary artery and PDA with Taxus drug-eluting stents in 2006.  He also  has a history of hypertension, hyperlipidemia, and glucose intolerance.  He is unable tolerate beta-blockers due to symptomatic bradycardia.   He had a Myoview for atypical chest pain in October 2008, which showed  an EF of 59%.  No ischemia or scar.   He continues to do very well.  He is walking 2-3 miles several times a  week without any chest pain or dyspnea.  He continues to work unloading  trucks.  He is without any difficulty whatsoever.  He is working pretty  hard.   He was having trouble tolerating Vytorin secondary to muscle aches, it  was stopped.  However, his LDL came back 148, so he was started on  pravastatin at 20 mg a day by Dr. Alphonsus Sias.  He also started Coenzyme Q10  on his own, which he says has helped tremendously.   REVIEW OF SYSTEMS:  Remaining review of systems is negative except for  as above.   CURRENT MEDICATIONS:  1. Claritin.  2. Prilosec p.r.n.  3. Plavix 75 a day.  4. Lisinopril and HCTZ 20/12.5 b.i.d.  5. Norvasc 2.5 a day.  6. Pravastatin 20 a day.  7. Vitamin C.  8. Coenzyme Q10   PHYSICAL EXAMINATION:  GENERAL:  He is well-appearing, in no acute  distress, ambulatory in the clinic without any respiratory difficulty.  VITAL SIGNS:  Blood pressure is 150/78, heart rate 65, weight is 185.  HEENT:  Normal.  NECK:  Supple.  No JVD.  Carotids are 2+ bilaterally without any bruits.  There is no lymphadenopathy or thyromegaly.  CARDIAC:  PMI is  nondisplaced.  Regular rate and rhythm.  No murmurs,  rubs, or gallops.  LUNGS:  Clear.  ABDOMEN:  Soft, nontender, nondistended.  No hepatosplenomegaly, no  bruits, no masses.  Good bowel sounds.  EXTREMITIES:  Warm with no  cyanosis, clubbing or edema.  No rash.  NEURO:  Alert and oriented x3.  Cranial nerves II-XII intact.  Moves all  4 extremities without difficulty.  Affect is pleasant.   EKG shows sinus bradycardia at rate of 57, no ST-T wave abnormalities.   ASSESSMENT AND PLAN:  1. Coronary artery disease, this is stable.  No evidence of ischemia.      He is doing quite well.  He has asked about the possibility of      stopping Plavix due to financial reasons.  I told him that he is      now 3-1/2 years out from the stents.  He would be at low risk for  stent thrombosis.  I said ideally the best we could stay on his      Plavix, but the benefit is rather small.  He is made the choice to      stop the Plavix.  He can go back on aspirin 81 mg a day.  When it      becomes generically may consider restarting Plavix.  2. Hyperlipidemia.  His LDL was markedly elevated off Vytorin, he is      now been on low-dose pravastatin.  We will increase this to 40 mg a      day.  He will follow up with Dr. Alphonsus Sias, goal LDL is less than 70.  3. Hypertension.  Blood pressure has been well-controlled at home.  He      appears to have white coat syndrome.  Continue current therapy.   DISPOSITION:  We will see him back in 9 months for routine followup.     Bevelyn Buckles. Bensimhon, MD  Electronically Signed    DRB/MedQ  DD: 03/07/2008  DT: 03/07/2008  Job #: [id number]

## 2010-09-28 NOTE — Cardiovascular Report (Signed)
Patrick [name], [name] NO.:  [id number]   MEDICAL RECORD NO.:  [id number]          PATIENT TYPE:  INP   LOCATION:  4730                         FACILITY:  [facility]   PHYSICIAN:  Arturo Morton. Riley Kill, M.D. [facility] OF BIRTH:  1941/03/25   DATE OF PROCEDURE:  07/02/2004  DATE OF DISCHARGE:                              CARDIAC CATHETERIZATION   This is Dr. Arturo Morton. Stuckey dictating cardiac catheterization report on  [name]. Medical record number [id number]. Date of study 07/02/2004, copy  to Dimas Aguas, to [employer], to the CV laboratory, to the  patient's medical record.   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 He  has had recent chest discomfort. He has a brother who has had a prior  stenting procedure. The patient has multiple risk factors for coronary  artery disease. He underwent stress testing today with positive chest pain  and ST depression. He was brought to the catheterization laboratory for  further evaluation and treatment.   PROCEDURE:  1.  Left heart catheterization  2.  Selective coronary territory.  3.  Selective left ventriculography.   DESCRIPTION OF PROCEDURE:  The patient was brought to the catheter lab and  prepped and draped in usual fashion. Through an anterior puncture, the right  femoral artery was easily entered. A 6-French sheath was placed. Central  aortic and left ventricular pressures were measured with a pigtail catheter.  Ventriculography was performed in the RAO projection. Following this,  coronary arteriography was performed using standard Judkins catheters.  Images were obtained both before and after intracoronary nitroglycerin  administration in both vessels. He tolerated the procedure well and there  were no complications. I reviewed the films with Dr. Charlies Constable. The  patient was taken to the holding area in satisfactory clinical condition for  further evaluation.   HEMODYNAMIC DATA:  1.  Central aortic pressure 122/73, mean 95.  2.  Left ventricular pressure 107/90.  3.  No gradient pullback across aortic valve.   ANGIOGRAPHIC DATA:  1.  Ventriculography was performed in the RAO projection. Overall systolic      function appeared to be preserved. There was ventricular ectopy. Despite      this, overall systolic function appeared well preserved.  2.  The right coronary artery is a dominant vessel providing a posterior      descending and posterolateral branch both of which are moderate in size.      The proximal portion of the right coronary artery just passed the ostium      demonstrates a 70-75% fairly smooth-appearing lesion. Distal to this,      approximately 15 mm, is a 50% area of focal stenosis. There is a fair      amount of Luminal irregularity throughout going into the distal vessel      without high grade focal stenosis. There is segmental tandem areas of      plaquing of about 70% in the mid posterior descending and about 40-50%      narrowing leading into the posterolateral branch.  3.  The left main tapers at its distal most aspect and still appears to be      well in excess of 2 mm. Nonetheless, there is about 40-50% distal      tapering that involves both the takeoff of the circumflex as well as the      proximal LAD. The LAD in the midportion at the takeoff of the diagonal      probably has about 40% at most 50% eccentric plaquing with the diagonal      having about 50-60% area of narrowing in its midportion. The distal LAD      tapers out after the origin of the second diagonal.  4.  The ostium of the circumflex as previously noted probably has about 50      percent narrowing. This is eccentric followed by perhaps 30-40% plaquing      in the midvessel. The vessel then provides a bifurcating marginal      branch. The inferior branch of which has moderate disease of 50%. The      second marginal then also has about 40-50% proximal  narrowing. The AV      circumflex is without critical disease. This wraps the posterolateral      segment.   CONCLUSION:  Moderate high-grade disease of the right coronary artery with  moderate severe 80 degrees involving the left coronary arteries described in  the above text. The exact best option tear is not clear. I have reviewed the  films with Dr. Juanda Chance. I will review the films also with Dr. Arvilla Meres and we will make a final decision as to treatment plan. I have  explained this to the patient who understands. is Arturo Morton. Stuckey and that  is the end of the dictation.      TDS/MEDQ  D:  07/02/2004  T:  07/03/2004  Job:  [id number]   cc:   Arvilla Meres, M.D. [facility]   Dimas Aguas, M.D.   CV Laboratory   Patient's medical record

## 2010-09-28 NOTE — Procedures (Signed)
[place]  Patient:    Patrick [name], Patrick [name] Visit Number: [id number] MRN: [id number]          Service Type: END Location: ENDO Attending Physician:  Nelda Marseille Dictated by:   Petra Kuba, M.D. Proc. Date: 06/08/01 Admit Date:  06/08/2001   CC:         Desma Maxim, M.D.   Procedure Report  PROCEDURE:  Colonoscopy with polypectomy.  INDICATION:  Patient with a history of colon polyps, due for repeat screening. Consent was signed after risks, benefits, methods, and options thoroughly discussed in the office and in the past.  MEDICATIONS:  Demerol 50 mg, Versed 10 mg.  DESCRIPTION OF PROCEDURE:  Rectal inspection was pertinent for small external hemorrhoids.  Digital exam was negative.  Video colonoscope was inserted and despite being easily advanced around the colon to the cecum, he had lots of abdominal pain and was uncomfortable for most of the advancement.  Abdominal pressure was not needed significantly, but we tried to decrease his pain and more pain medicines were given.  Upon insertion, no obvious abnormalities were seen.  The cecum was identified by the appendiceal orifice and the ileocecal valve.  Prep was fairly adequate, seemed to be better on the left than the right.  Moderate amount of washing and suctioning were done.  On slow withdrawal through the colon, the cecum and ascending were normal.  In the midtransverse possibly when we fell back around a loop, a tiny polyp was seen but in readvancing to this possible tiny polyp, it did cause increased pain and we elected to continue to withdraw.  On the left side of the colon mostly in the sigmoid, three tiny polyps were seen and were all hot biopsied x1 and put in the same container.  Once back in the rectum the scope was retroflexed, pertinent for some internal hemorrhoids.  The scope was straightened and readvanced a short way up the sigmoid, air was suctioned, the  scope removed. The patient tolerated the procedure only fair.  There was no obvious immediate complication.  ENDOSCOPIC DIAGNOSES: 1. Internal-external hemorrhoids. 2. Three tiny left-sided probably all sigmoid polyps, not biopsied. 3. A questionable distal transverse tiny polyp seen but unable to readvance    and biopsy it secondary to the pain. 4. Otherwise within normal limits to the cecum.  PLAN:  Await pathology to determine future colonic screening.  Might check a year earlier based on prep and the possible one polyp that was possibly not removed.  Would probably recommend diprivan on recheck or the pediatric adjustable scope if not both, and be happy to see back p.r.n.  Otherwise return care to Dr. Arvilla Market for the customary health care maintenance to include yearly rectals and guaiacs. Dictated by:   Petra Kuba, M.D. Attending Physician:  Nelda Marseille DD:  06/08/01 TD:  06/08/01 Job: [id number]

## 2010-09-28 NOTE — Discharge Summary (Signed)
NAMEKYHEEM, BATHGATE NO.:  [id number]   MEDICAL RECORD NO.:  [id number]          PATIENT TYPE:  INP   LOCATION:  6525                         FACILITY:  [facility]   PHYSICIAN:  Carole Binning, M.D. LHCDATE OF BIRTH:  [birth year]   DATE OF ADMISSION:  07/02/2004  DATE OF DISCHARGE:  07/04/2004                                 DISCHARGE SUMMARY   SUMMARY OF HISTORY:  Patrick [name] is a 70 year old male who was referred to Dr.  Gala Romney on June 18, 2004, by Dr. Orlie Dakin for shortness of breath and  abnormal EKG. A stress Myoview on the morning of the 20th, he developed  chest discomfort inferolateral ST changes persisting through recovery. His  discomfort resolved spontaneously. Imaging suggested inferior reversibility.  The patient remained asymptomatic after the test and it was felt that he  should undergo cardiac catheterization. His history is notable for  hypertension, hyperlipidemia, remote tobacco use, and GERD.   LABORATORY DATA:  Admission H&H was 14.7 and 41.8, normal indices, platelet  count 315,000, WBC 7.3. Subsequent hematologies are unremarkable. At the  time of discharge H&H was 13.3, 38.2, and normal indices. Platelet count  266,000, WBC 6.4. PTT 27, PT 12.6.  Admission sodium was 139, potassium 3.9,  BUN 11, creatinine 1.1, glucose 114. At the time of discharge, sodium was  138, potassium 4.6, BUN 8, creatinine 0.9.  Serial four CK-MBs and relative  indices were negative for myocardial infarction. Troponins were 0.01, 0.01,  0.02, and 0.05.  Fasting lipids on the 21st showed a total cholesterol of  118, triglycerides 87, HDL low at 36, LDL 65. TSH was 0.904.   EKG on admission showed normal sinus rhythm, left axis deviation, early R-  wave, left anterior hemiblock.   HOSPITAL COURSE:  Patrick [name] was admitted to the hospital. He underwent  cardiac catheterization on July 02, 2004, by Dr. Riley Kill. Please refer  to dictated note. Briefly, his ejection  fraction was normal. Dr. Riley Kill  felt that he had possible moderate to severe RCA disease and it was felt  that this should be reviewed with Dr. Juanda Chance and Dr. Gala Romney. Dr. Riley Kill  felt that he may need angioplasty or medical treatment versus bypass  surgery.   After reviewing the films in, Dr. Gala Romney and Dr. Juanda Chance felt that PCI of  RCA should be performed. Case management had also participated and discharge  needs. On July 03, 2004, Dr. Gerri Spore performed Taxus express stenting  to the proximal RCA and the PDA producing both lesions to 0%. Dr. Gerri Spore  noted that the patient should receive Plavix for one year and continue  medical treatment for residual left-sided coronary artery disease. It is  noted at the time of review that EKG is still pending and prior to discharge  this will be reviewed.   DISCHARGE DIAGNOSES:  1.  Unstable angina with positive stress Myoview.  2.  Status post stenting times two to the right coronary artery as      previously described.  3.  Hyperlipidemia. History as previously mentioned.   DISPOSITION:  After his EKG  is reviewed, his discharge is anticipated. The  patient will be evaluated by cardiac rehab prior to discharge.   NEW MEDICATIONS:  1.  Plavix 75 mg daily.  2.  Nitroglycerin 0.4 mg as needed.  3.  Aspirin 325 mg daily.  4.  Triamterene/HCTZ 37.5/25 mg daily.  5.  Lovastatin 40 mg q.h.s.  6.  Altace 5 mg daily.  7.  He is given permission to continue his multivitamin, vitamin B, vitamin      C, and Claritin as previously.   He is instructed in no lifting, driving, sexual activity, or heavy exertion  for two days. Maintain a low salt, low fat, and low cholesterol diet.  If he  has any problems with this catheterization site such as bruising, drainage,  swelling, or discomfort, he was asked to call. He will follow up with Dr.  Gala Romney in the office on February 27th at 11a.m. At the time of follow up  with Dr. Gala Romney,  consider the patient should be given to cardiac rehab  particularly with his low HDL.      EW/MEDQ  D:  07/04/2004  T:  07/04/2004  Job:  [id number]

## 2010-09-28 NOTE — Assessment & Plan Note (Signed)
[facility]                              CARDIOLOGY OFFICE NOTE   Patrick [name], Patrick [name]                       MRN:          [id number]  DATE:02/26/2006                            DOB:          19-Apr-1941    PRIMARY CARE PHYSICIAN:  Dr. Alphonsus Sias   PATIENT IDENTIFICATION:  Patrick [name] is a delightful 70 year old male with  coronary artery disease who returns for routine followup.   PROBLEM:  1. Coronary artery disease status post percutaneous transluminal coronary      angioplasty and stenting of the right coronary artery and posterior PDA      with Taxus drug-eluting stents February 2006.      a.     Exercise treadmill test for [employer]       physical November 2006.  Walked 12-1/2 minutes of standard Bruce       protocol.  No EKG changes.  2. Symptomatic bradycardia on nadolol beta blocker discontinued.  3. Shortness of breath.      a.     Echocardiogram February 2006, normal left ventricular function,       no significant valvular disease.      b.     PFTs January 2006, mild restriction.  4. Hypertension.  5. Hyperlipidemia, possible metabolic syndrome.  6. History of tobacco, quit in 1980s.  7. Gastroesophageal reflux disease.   CURRENT MEDICATIONS:  1. Triamterene/hydrochlorothiazide 37.5/25 half tablet a day.  2. Vitamin C.  3. Vitamin B.  4. Plavix 75.  5. Vytorin 10/40.  6. Aspirin 81.  7. Diovan 160.  8. Fish oil.   INTERVAL HISTORY:  Patrick [name] returns today for routine followup.  He is doing  quite well.  He is exercising about 30 minutes a day on the bike and  treadmill with no chest pain or shortness of breath.  He does note that his  blood pressure has been up in the 130s recently and he attributes this to  relationship stress at home.  Previously his blood pressure was well  controlled in the 120s.  He has not had any heart failure or claudication  symptoms.   PHYSICAL EXAMINATION:  Well-appearing, no acute  distress.  Ambulates around  the clinic without any difficulty.  Respirations are unlabored.  Blood pressure today is 148/78.  Heart rate is 55.  Weight is 185.  HEENT:  Sclerae anicteric.  EOMI.  No xanthelasma.  Mucous membranes are  moist.  NECK:  Is supple.  No JVD.  Carotids 2+ bilaterally, no bruits.  No  lymphadenopathy or thyromegaly.  CARDIAC:  Regular rate and rhythm.  No murmurs, rubs or gallops.  LUNGS:  Are clear with mildly prolonged expiratory phase.  ABDOMEN:  Soft, nontender, mildly obese, good bowel sounds.  No  hepatosplenomegaly or bruits.  EXTREMITIES:  Are warm with no cyanosis, clubbing or edema.  NEUROLOGIC:  Alert and oriented x3.  Cranial nerves II through XII are  intact.  He moves all 4 extremities without difficulty.   EKG:  Shows sinus bradycardia at the rate of 55 with no ST-T  wave changes.   ASSESSMENT AND PLAN:  1. Coronary artery disease.  Doing quite well without any evidence of      angina.  Continue current therapy.  Would suggest Plavix indefinitely.  2. Asymptomatic bradycardia.  Now we are holding beta blocker.  3. Hyperlipidemia, well controlled at last check.  Repeat lipid panel      today.  4. Hypertension.  I have asked him to keep a blood pressure log and I      suspect we will need to titrate his antihypertensive regimen slightly.  5. 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  Given his      residual coronary artery disease, I do think it is reasonable also to      perform a Myoview with it.   DISPOSITION:  Return to clinic in 6 months for followup.  I did tell him to  call me if his blood pressures were significantly elevated.       Bevelyn Buckles. Bensimhon, MD     DRB/MedQ  DD:  02/26/2006  DT:  02/27/2006  Job #:  [id number]

## 2010-09-28 NOTE — Assessment & Plan Note (Signed)
[facility]                            CARDIOLOGY OFFICE NOTE   Patrick [name], Patrick [name]                       MRN:          [id number]  DATE:08/29/2006                            DOB:          10-Nov-1940    PRIMARY CARE PHYSICIAN:  Dr. Alphonsus Sias   INTERVAL HISTORY:  Patrick [name] is a delightful 70 year old male with  coronary artery disease status post previous angioplasty and stenting of  the right coronary artery and PDA with Taxus drug eluting stents in  2006. He also has a history of hypertension, hyperlipidemia, and glucose  intolerance. He was previously on a beta blocker, but this had to be  stopped due to symptomatic bradycardia.   He returns today for a routine follow up. He is doing quite well. 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 There is no shortness of breath. No lower  extremity edema. Unfortunately, he has not been as compliant with his  exercise program as he was previously due to his work schedule.   CURRENT MEDICATIONS:  1. Triamterene/hydrochlorothiazide 37.5/25 mg 1/2 tablet daily.  2. Claritin.  3. Prilosec.  4. Plavix 75 mg daily.  5. Vytorin 10/40 mg.  6. Aspirin 81 mg.  7. Diovan 160 mg.  8. Fish oil 1200 mg daily.   PHYSICAL EXAMINATION:  GENERAL:  Well appearing in no acute distress, he  ambulates around the clinic without any respiratory difficulty.  VITAL SIGNS:  Blood pressure 132/80, heart rate 74, weight 183.  HEENT:  Sclerae anicteric, EOMI, there is no xanthelasma, conjunctiva  are not injected. Oral pharynx is clear, mucous membranes are moist.  NECK:  Supple. There is no JVD. Carotids are 2+ bilaterally without any  bruits. There is no lymphadenopathy or thyromegaly.  CARDIAC:  Regular rate and rhythm, no murmurs, rubs, or gallops.  LUNGS:  Clear.  ABDOMEN:  Soft and nontender, nondistended, no hepatosplenomegaly, no  bruits, no masses, good bowel  sounds.  EXTREMITIES:  Warm with no cyanosis, clubbing, or edema. No rash.  NEUROLOGIC:  Awake, alert, and oriented x3. Cranial nerves 2-12 are  intact. He can use all 4 extremities without difficulty.   EKG shows normal sinus rhythm at a rate of 74. There are no STT wave  abnormalities.   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   His most recent cholesterol shows total cholesterol at 129. HDL of 43.  LDL of 68. Fasting glucose was 113. Liver function tests were normal.   ASSESSMENT AND PLAN:  1. Coronary artery disease. He is quite stable without any evidence of      ongoing angina, continue current regimen.  2. Hypertension. This is suboptimally controlled. Increase his Diovan      to 320 mg per day. Check a B-met in one week.  3. Hyperlipidemia. We had a long talk about the potential of switching  his Vytorin over to a higher dose such as Crestor or Lipitor. He      previously had problems with myalgias with Lipitor. Given these      problems, we will continue on the Vytorin for now. His LDL is at      goal.  4. Glucose intolerance. I suggested that the best therapy would be      more exercise and weight loss. I have given him a goal of losing 8      pounds within the next six months to get down to a weight of 175      when he sees me next. We will also check a hemoglobin A1C which can      be forwarded to Dr. Alphonsus Sias.   DISPOSITION:  Return to the clinic in six months for routine follow up.     Bevelyn Buckles. Bensimhon, MD  Electronically Signed    DRB/MedQ  DD: 08/29/2006  DT: 08/30/2006  Job #: [id number]   cc:   Karie Schwalbe, MD

## 2010-09-28 NOTE — Cardiovascular Report (Signed)
NAMERENLEY, BANWART NO.:  [id number]   MEDICAL RECORD NO.:  [id number]          PATIENT TYPE:  INP   LOCATION:  4730                         FACILITY:  [facility]   PHYSICIAN:  Carole Binning, M.D. LHCDATE OF BIRTH:  1941/02/03   DATE OF PROCEDURE:  07/03/2004  DATE OF DISCHARGE:                              CARDIAC CATHETERIZATION   PROCEDURES PERFORMED:  1.  PTCA with placement of drug eluting stent in the posterior descending      artery.  2.  PTCA with placement of a drug eluting stent in the proximal right      coronary artery.   INDICATIONS FOR PROCEDURE:  Mr. Blitch is a 70 year old male who has had  exertional angina.  He underwent a stress nuclear study in the office  yesterday which was notable for development of significant inferior lateral  ST segment depression with exercise which persisted for five minutes into  recovery.  Nuclear images showed inferior ischemia.  The patient underwent  cardiac catheterization yesterday by Dr. Riley Kill  which revealed complex  disease in the right coronary artery with a long 80% stenosis in the  proximal vessel and a long 80% stenosis in the posterior descending artery.  The patient was brought back today for percutaneous coronary intervention.   DESCRIPTION OF PROCEDURE:  A 6 French sheath was placed in the right femoral  artery.  Angiomax was administered per protocol.  We used a 6 [place] JR4  guiding catheter.  An Asahi soft coronary guidewire was advanced under  fluoroscopic guidance into the distal posterior descending artery.  We then  advanced a 2.5 x 20 mm Quantum balloon over this wire and inflated it to 8  atmospheres in the distal aspect of the disease in the posterior descending  artery and 10 atmospheres in the proximal aspect of the diseased vessel in  the posterior descending artery.  We then positioned a 2.5 x 24 mm TAXUS  drug eluting stent  in the posterior descending artery across the diseased  segment of vessel.  The stent was deployed at 7 atmospheres.  We then back  with a 2.5 x 20 mm Quantum balloon and inflated this to 15 atmospheres in  the proximal aspect of the stent and 13 atmospheres in the distal aspect of  the stent.  Angiographic images at that point revealed an excellent result  with 0% residual stenosis at the stent site and TIMI-3 flow.   We then turned our attention to the proximal right coronary artery.  We  positioned a 3.0 x 28 mm TAXUS drug eluting stent across the diseased  segment of vessel in the proximal right coronary artery.  The stent was  deployed at 16 atmospheres.  We then went back with 12 mm Quantum balloon  and inflated this to 22 atmospheres in the proximal portion of the stent and  10 atmospheres in the mid portion of the stent.  Intracoronary nitroglycerin  was administered.  Images were obtained showing patency of the proximal  right coronary artery with 0% residual stenosis at the stent site and TIMI-3  flow.   COMPLICATIONS:  None.   1.  Placement of a drug eluting in the posterior descending artery.  A long      80% stenosis was reduced to 0% residual with TIMI-3 flow.  2.  Successful percutaneous transluminal coronary angioplasty with placement      of a drug eluting stent in the proximal right coronary artery.  A long      80% stenosis was reduced to 0% residual TIMI-3 flow.   PLAN:  Angiomax will be discontinued.  Patient will be be treated with  Plavix for a recommended one year.  He does have residual in the left  coronary arteries for which aggressive risk factor modification and medical  therapy is recommended.      MWP/MEDQ  D:  07/03/2004  T:  07/03/2004  Job:  [id number]   cc:   Tillman Abide, M.D.

## 2010-09-28 NOTE — Assessment & Plan Note (Signed)
[facility]                                 ON-CALL NOTE   THELBERT, GARTIN                         MRN:          [id number]  DATE:02/21/2007                            DOB:          [birth year]    PRIMARY CARDIOLOGIST:  Dr. Gala Romney.   SUPERVISING PHYSICIAN:  Dr. Myrtis Ser.   HISTORY:  Mr. Patrick [name] is a 70 year old male patient of Dr. Gala Romney who is  currently traveling to [facility].  During his travel to [facility], he  developed approximately 20 to 30 minutes of indigestion.  He states that  he has never had this before.  He does relay a history of 2 prior  coronary artery stenting, but states he has not ever had any problems  with chest discomfort.  The problems with his circulation to his heart  has only been picked up on stress test.  He goes on to elaborate that he  had a stress Myoview in the office on Thursday, however, he has not  heard of any results at this time.  He calls today stating that he has  left home and forgotten his nitroglycerin prescription, and his  insurance card, and asks me to call in a prescription for his sublingual  nitroglycerin.   After speaking with Mr. Brideau further about his chest discomfort, I  informed him if he has another episode of chest discomfort that is  particularly not relieved with 3 sublingual nitroglycerin over a 15 to  20 minute time period, he needs to proceed to the nearest emergency  room.  I also told him that I would attempt to call Dr. Gala Romney to see  if he has heard of his stress test results, as they are not available to  me in E-chart.   I spoke with Dr. Gala Romney, and he recalled Mr. Carew, however, he has  not heard of any stress Myoview results.   I called in a prescription for sublingual nitroglycerin to [facility]  in [place], phone number [phone].  I then called Mr. Majkowski back  informing him that Dr. Gala Romney had not heard of his stress test  results, and reiterated our  nitroglycerin rules in association with  chest discomfort, and informed him that I did call in a prescription.  I  asked him to call us back if he had any further difficulty, but  specifically to proceed to the nearest emergency room for further chest  discomfort.  He agreed with plan.     Joellyn Rued, PA-C  Electronically Signed      Luis Abed, MD, [facility]  Electronically Signed   EW/MedQ  DD: 02/21/2007  DT: 02/21/2007  Job #: [id number]

## 2010-10-21 ENCOUNTER — Inpatient Hospital Stay (INDEPENDENT_AMBULATORY_CARE_PROVIDER_SITE_OTHER)
Admission: RE | Admit: 2010-10-21 | Discharge: 2010-10-21 | Disposition: A | Discharge status: Home / Self Care | Payer: Medicare Other | Source: Ambulatory Visit | Attending: Emergency Medicine | Admitting: Emergency Medicine

## 2010-10-21 DIAGNOSIS — R197 Diarrhea, unspecified: Secondary | ICD-10-CM

## 2010-10-21 LAB — POCT I-STAT, CHEM 8
BUN: 19 mg/dL (ref 6–23)
Calcium, Ion: 1.06 mmol/L — ABNORMAL LOW (ref 1.12–1.32)
Chloride: 105 mEq/L (ref 96–112)
Creatinine, Ser: 1.1 mg/dL (ref 0.4–1.5)
Glucose, Bld: 94 mg/dL (ref 70–99)

## 2010-10-22 ENCOUNTER — Telehealth: Payer: Self-pay | Admitting: *Deleted

## 2010-10-22 NOTE — Telephone Encounter (Signed)
Please check on him In general, I don't recommend immodium with acute diarrhea as it can "bottle up" the infection inside

## 2010-10-22 NOTE — Telephone Encounter (Signed)
.  left message to have patient's daughter return my call.  

## 2010-10-22 NOTE — Telephone Encounter (Signed)
Triage Record Num: [id number] Operator: Alphonsa Overall Patient Name: Patrick [name] Call Date & Time: 10/21/2010 5:35:18PM Patient Phone: [phone] PCP: Lelon Perla Patient Gender: Male PCP Fax : [phone] Patient DOB: April 20, 1941 Practice Name: [facility] Reason for Call: [name] calling about diarrhea. Onset 10/20/10 at 0900. Active for pt. Foul smelling diarrhea x 13 green in color. Afebrile. Immodium and Pepto taken 10/21/10. Afebrile. Drinking well. Per [name] having severe cramping /pain in abdomen and rectum, not able to sleep/or continue activities. Pt and [name] aware needs ED evaluation. Care advice given. Protocol(s) Used: Diarrhea or Other Change in Bowel Habits Recommended Outcome per Protocol: See ED Immediately Reason for Outcome: Severe pain/cramping in abdomen or rectum that interferes with ability to carry out normal activities or sleep Care Advice: ~ Allow the patient to be in a position of comfort. ~ Another adult should drive. Call EMS 911 if signs and symptoms of shock develop (such as unable to stand due to faintness, dizziness, or lightheadedness; new onset of confusion; slow to respond or difficult to awaken; skin is pale, gray, cool, or moist to touch; severe weakness; loss of consciousness). ~ Change position slowly. Sit for a couple of minutes before standing to walk. Quick position changes may cause or worsen symptoms. ~ ~ IMMEDIATE ACTION Write down provider's name. List or place the following in a bag for transport with the patient: current prescription and/or nonprescription medications; alternative treatments, therapies and medications; and street drugs. ~ May have clear liquids (such as water, clear fruit juices without pulp, soda, tea or coffee without dairy or non-dairy creamer, clear broth or bouillon, oral hydration solution, or plain gelatin, fruit ices/popsicles, hard candy) but do not eat solid foods before being seen by  your provider. ~ 10/21/2010 5:47:09PM Page 1 of 1 [id number]

## 2010-10-23 NOTE — Telephone Encounter (Signed)
Spoke with patient and he's doing better, per pt he went to [facility] and they " fixed me up".

## 2010-10-23 NOTE — Telephone Encounter (Signed)
Good to hear

## 2010-11-16 ENCOUNTER — Encounter: Payer: Self-pay | Admitting: Internal Medicine

## 2010-11-16 ENCOUNTER — Ambulatory Visit (INDEPENDENT_AMBULATORY_CARE_PROVIDER_SITE_OTHER): Payer: Medicare Other | Admitting: Internal Medicine

## 2010-11-16 VITALS — BP 122/62 | HR 56 | Temp 97.9°F | Ht 67.0 in | Wt 185.0 lb

## 2010-11-16 DIAGNOSIS — R197 Diarrhea, unspecified: Secondary | ICD-10-CM | POA: Insufficient documentation

## 2010-11-16 MED ORDER — CHOLESTYRAMINE LIGHT 4 G PO PACK: 4.0000 g | PACK | Freq: Every day | ORAL | Status: DC

## 2010-11-16 NOTE — Progress Notes (Signed)
Subjective:    Patient ID: Patrick [name], male    DOB: [birth year], 70 y.o.   MRN: [id number]  HPI Has been dealing with bowel problems for a month Lots of gas--"enough to blow the house away" Has been using immodium and pepto regularly  Just noted a change in his bowels Had stomach pain and spent the night on a cammode Seen at [facility] ER for the diarrhea---got antibiotic for 5 days Diarrhea resolved but it came back the next week  pepto and immodium will stop the diarrhea for a day--then it returns No blood No pain now  Appetite is okay No N/V  No new meds or other products Has cut off alcohol and no help  Very little dairy Does chew sugarless gum--mostly when he is on the road  No current outpatient prescriptions on file prior to visit.    Allergies  Allergen Reactions  . Atorvastatin     REACTION: leg cramps  . Ezetimibe-Simvastatin     REACTION: cramps    Past Medical History  Diagnosis Date  . CAD (coronary artery disease)      --s/p Taxus DES to RCA and PDA in 2006,  --Myoview 8/11: Walked 12:37. EF 57%. no ischemia or scar  . Hypertension   . Hyperlipidemia   . Allergy   . Bradycardia   . GERD (gastroesophageal reflux disease)   . Colon polyps   . Diverticulitis   . BPH (benign prostatic hypertrophy)   . Sleep apnea   . Impaired fasting glucose   . Pneumonia   . Syncope     Past Surgical History  Procedure Date  . Angioplasty / stenting femoral     percutaneous transluminal coronary  angioplasty and stenting of the right coronary artery and posterior   . Patent ductus arterious repair     with Taxus drug-eluting stents February 2006.  Marland Kitchen Pilonidal cyst / sinus excision     Family History  Problem Relation Age of Onset  . Coronary artery disease Father   . Hypertension Neg Hx   . Diabetes Neg Hx     History   Social History  . Marital Status: Single    Spouse Name: N/A    Number of Children: 3  . Years of Education: N/A   Occupational  History  . driver part time    Social History Main Topics  . Smoking status: Former Smoker    Quit date: 05/13/1978  . Smokeless tobacco: Never Used  . Alcohol Use: Yes  . Drug Use: Not on file  . Sexually Active: Not on file   Other Topics Concern  . Not on file   Social History Narrative  . No narrative on file   Review of Systems No cough or SOB No urinary problems Lost 7# with the initial illness but has mostly gained that back    Objective:   Physical Exam  Constitutional: He appears well-developed and well-nourished. No distress.  Neck: Normal range of motion. Neck supple.  Cardiovascular: Normal rate, regular rhythm and normal heart sounds.  Exam reveals no gallop.   No murmur heard. Pulmonary/Chest: Effort normal and breath sounds normal. No respiratory distress. He has no wheezes. He has no rales.  Abdominal: Soft. Bowel sounds are normal. He exhibits no distension and no mass. There is no tenderness. There is no rebound and no guarding.  Musculoskeletal: He exhibits no edema and no tenderness.  Lymphadenopathy:    He has no cervical adenopathy.  Assessment & Plan:

## 2010-11-16 NOTE — Assessment & Plan Note (Signed)
Going on for a month Initially sounds infectious and was treated for 5 days with antibiotics Could be C. Dif but doesn't seem like he is sick Very little lactose Some sorbitol in gum and drinks gatorade to hydrate when doing his lawns  P: will try probiotic     No sugarless gum for now     Try cholestyramine instead of immodium for now     Will just check C dif

## 2010-11-16 NOTE — Patient Instructions (Signed)
Please stop the immodium and pepto bismol Hold off on sugarless gum until your diarrhea is better Please try a probiotic like activia yogurt or tablet from store Please take the cholestyramine daily to firm up your stools and you can wean off this once you are back to a normal pattern

## 2010-12-05 ENCOUNTER — Encounter: Payer: Self-pay | Admitting: Internal Medicine

## 2010-12-24 ENCOUNTER — Ambulatory Visit (INDEPENDENT_AMBULATORY_CARE_PROVIDER_SITE_OTHER): Payer: Medicare Other | Admitting: Family Medicine

## 2010-12-24 ENCOUNTER — Encounter: Payer: Self-pay | Admitting: Family Medicine

## 2010-12-24 VITALS — BP 130/70 | HR 64 | Temp 98.2°F | Wt 183.2 lb

## 2010-12-24 DIAGNOSIS — R197 Diarrhea, unspecified: Secondary | ICD-10-CM

## 2010-12-24 LAB — HEPATIC FUNCTION PANEL
ALT: 22 U/L (ref 0–53)
Alkaline Phosphatase: 87 U/L (ref 39–117)
Bilirubin, Direct: 0.1 mg/dL (ref 0.0–0.3)
Total Protein: 7.5 g/dL (ref 6.0–8.3)

## 2010-12-24 NOTE — Progress Notes (Signed)
2 months of sx.  He can take imodium and pepto with transient relief.  6 episodes in 2 months.  Usually has gas all day and then the diarrhea starts.  Started with another episode this weekend.  Minimal dairy.  No help with cutting out etoh briefly.  Does chew sugarless gum- no change with this.  He help with stopping statin as far as GI side effects, but myalgias are improved.  No BRBPR, no vomiting.  Had colonoscopy prev with diverticulosis.  No change with cholestyramine.  No fevers.  No other new meds or other triggers known.  Last travel was in 1/12 to [place]  Well water at home.  Lives alone.  There were sick contacts recently but not with other episodes.  Prev on abx, w/o change in sx overall.  Prev c diff neg. Up to 1 bottle of wine at night, usually 3 nights a week.  Prev was off etoh ~1 week.    Prev note from PMD reviewed.   PMH and SH reviewed  ROS: See HPI, otherwise noncontributory.  Meds, vitals, and allergies reviewed.   GEN: nad, alert and oriented HEENT: mucous membranes moist NECK: supple w/o LA CV: rrr PULM: ctab, no inc wob ABD: soft, +bs EXT: no edema SKIN: no acute rash

## 2010-12-24 NOTE — Assessment & Plan Note (Addendum)
IFOB, stool cx, LFTs, cut out etoh and refer back to GI.  Pt agrees.  Broad ddx, d/w pt.  Nontoxic.  >25 min spent with face to face with patient, >50% counseling.

## 2010-12-24 NOTE — Patient Instructions (Addendum)
We'll contact you with your lab report. See Shirlee Limerick about your referral before your leave today. Cut out wine in the meantime.   Take care.

## 2010-12-27 ENCOUNTER — Other Ambulatory Visit: Payer: Medicare Other

## 2010-12-28 ENCOUNTER — Encounter: Payer: Self-pay | Admitting: Internal Medicine

## 2010-12-28 ENCOUNTER — Ambulatory Visit (INDEPENDENT_AMBULATORY_CARE_PROVIDER_SITE_OTHER): Payer: Medicare Other | Admitting: Internal Medicine

## 2010-12-28 ENCOUNTER — Telehealth: Payer: Self-pay | Admitting: *Deleted

## 2010-12-28 VITALS — BP 110/60 | HR 67 | Temp 98.2°F | Ht 67.0 in | Wt 180.0 lb

## 2010-12-28 DIAGNOSIS — I2510 Atherosclerotic heart disease of native coronary artery without angina pectoris: Secondary | ICD-10-CM

## 2010-12-28 DIAGNOSIS — R197 Diarrhea, unspecified: Secondary | ICD-10-CM

## 2010-12-28 DIAGNOSIS — I10 Essential (primary) hypertension: Secondary | ICD-10-CM

## 2010-12-28 DIAGNOSIS — Z0000 Encounter for general adult medical examination without abnormal findings: Secondary | ICD-10-CM | POA: Insufficient documentation

## 2010-12-28 DIAGNOSIS — E785 Hyperlipidemia, unspecified: Secondary | ICD-10-CM

## 2010-12-28 NOTE — Assessment & Plan Note (Signed)
Quiet No changes

## 2010-12-28 NOTE — Assessment & Plan Note (Signed)
Off all meds now Would rechallenge with statin when things settle down

## 2010-12-28 NOTE — Telephone Encounter (Signed)
The patient phoned back saying that he had an OV with you this a.m and neglected to tell you that he has a hiatal hernia and has a tick bite from about 2 months ago that has a lump under it.  He doesn't know if this has any bearing on his visit but wanted to make a note of it in the chart.

## 2010-12-28 NOTE — Patient Instructions (Signed)
When everything settles down with your bowels, please restart the pravastatin. Call if you have problems with cramps then

## 2010-12-28 NOTE — Assessment & Plan Note (Signed)
Other than diarrhea, doing well Stays active Discussed PSA---will not check anymore Rx for zostavax

## 2010-12-28 NOTE — Assessment & Plan Note (Signed)
Persists Has colonoscopy scheduled On cholestyramine

## 2010-12-28 NOTE — Assessment & Plan Note (Signed)
BP Readings from Last 3 Encounters:  12/28/10 110/60  12/24/10 130/70  11/16/10 122/62   Good control Lab Results  Component Value Date   CREATININE 1.10 10/21/2010   No changes

## 2010-12-28 NOTE — Progress Notes (Signed)
Subjective:    Patient ID: Patrick [name], male    DOB: [birth year], 70 y.o.   MRN: [id number]  HPI Still with diarrhea Dr Ewing Schlein had him stop the zetia also Off the pravastatin also (since had spasm in feet) Colonoscopy scheduled Culture negative thus far  Cholestyramine didn't seem to help Has had a hard time taking it but has restarted  No other new concerns  Reviewed Wellness Form No depression Occ issues with thinking---names will not come out right away No falls  Current Outpatient Prescriptions on File Prior to Visit  Medication Sig Dispense Refill  . amLODipine (NORVASC) 5 MG tablet Take 5 mg by mouth daily.        . Ascorbic Acid (VITAMIN C) 1000 MG tablet Take 1,000 mg by mouth daily.        Marland Kitchen aspirin 81 MG tablet Take 81 mg by mouth daily.        . cholestyramine light (PREVALITE) 4 G packet Take 4 g by mouth daily as needed.        . Coenzyme Q10 (CO Q-10) 100 MG CAPS Take by mouth daily.        . fish oil-omega-3 fatty acids 1000 MG capsule Take 2 g by mouth daily.        Marland Kitchen lisinopril-hydrochlorothiazide (PRINZIDE,ZESTORETIC) 20-12.5 MG per tablet Take 1 tablet by mouth daily.          Allergies  Allergen Reactions  . Atorvastatin     REACTION: leg cramps  . Ezetimibe-Simvastatin     REACTION: cramps  . Pravastatin     Myalgias at 40mg     Past Medical History  Diagnosis Date  . CAD (coronary artery disease)      --s/p Taxus DES to RCA and PDA in 2006,  --Myoview 8/11: Walked 12:37. EF 57%. no ischemia or scar  . Hypertension   . Hyperlipidemia   . Allergy   . Bradycardia   . GERD (gastroesophageal reflux disease)   . Colon polyps   . Diverticulitis   . BPH (benign prostatic hypertrophy)   . Sleep apnea   . Impaired fasting glucose   . Pneumonia   . Syncope     Past Surgical History  Procedure Date  . Angioplasty / stenting femoral     percutaneous transluminal coronary  angioplasty and stenting of the right coronary artery and posterior   .  Patent ductus arterious repair     with Taxus drug-eluting stents February 2006.  Marland Kitchen Pilonidal cyst / sinus excision     Family History  Problem Relation Age of Onset  . Coronary artery disease Father   . Hypertension Neg Hx   . Diabetes Neg Hx     History   Social History  . Marital Status: Single    Spouse Name: N/A    Number of Children: 3  . Years of Education: N/A   Occupational History  . driver part time    Social History Main Topics  . Smoking status: Former Smoker    Quit date: 05/13/1978  . Smokeless tobacco: Never Used  . Alcohol Use: Yes     wine nightly if not working  . Drug Use: No  . Sexually Active: Not on file   Other Topics Concern  . Not on file   Social History Narrative  . No narrative on file   Review of Systems  Constitutional: Negative for fatigue and unexpected weight change.       Inconsistent with  seat belt--counselled  HENT: Negative for hearing loss, rhinorrhea, dental problem, postnasal drip and tinnitus.   Eyes: Negative for visual disturbance.       Glaucoma but vision is still okay  Respiratory: Positive for cough. Negative for chest tightness and shortness of breath.        Oc dry cough---relates to mowing. Uses the loratdine  Cardiovascular: Negative for chest pain, palpitations and leg swelling.  Gastrointestinal: Positive for abdominal pain and diarrhea. Negative for nausea, vomiting and blood in stool.       No recent problems with heartburn  Genitourinary: Positive for urgency. Negative for dysuria and difficulty urinating.       Nocturia 1-2 No sexual problems  Musculoskeletal: Positive for back pain. Negative for joint swelling and arthralgias.       Chronic mild low back pain---worse with riding on mower No meds for this  Skin: Negative for rash and wound.  Neurological: Negative for dizziness, syncope, weakness, light-headedness, numbness and headaches.  Hematological: Negative for adenopathy. Does not bruise/bleed  easily.  Psychiatric/Behavioral: Negative for sleep disturbance and dysphoric mood. The patient is not nervous/anxious.        Objective:   Physical Exam  Constitutional: He is oriented to person, place, and time. He appears well-developed and well-nourished. No distress.  HENT:  Head: Normocephalic and atraumatic.  Right Ear: External ear normal.  Left Ear: External ear normal.  Mouth/Throat: Oropharynx is clear and moist. No oropharyngeal exudate.       TMs normal  Eyes: Conjunctivae and EOM are normal. Pupils are equal, round, and reactive to light.  Neck: Normal range of motion. Neck supple. No thyromegaly present.  Cardiovascular: Normal rate, regular rhythm and intact distal pulses.  Exam reveals no gallop.   No murmur heard. Pulmonary/Chest: Effort normal and breath sounds normal. No respiratory distress. He has no wheezes. He has no rales.  Abdominal: Soft. He exhibits no mass. There is no tenderness.  Musculoskeletal: Normal range of motion. He exhibits no edema and no tenderness.  Lymphadenopathy:    He has no cervical adenopathy.  Neurological: He is alert and oriented to person, place, and time. He exhibits normal muscle tone.       Normal strength and gait  Skin: Skin is warm. No rash noted.  Psychiatric: He has a normal mood and affect. His behavior is normal. Judgment and thought content normal.          Assessment & Plan:

## 2010-12-30 NOTE — Telephone Encounter (Signed)
It is not unusual to have a persistent lump at the site of a tick bite As longs as he feels well, no action needed  Hiatal hernia info noted

## 2010-12-31 DIAGNOSIS — R197 Diarrhea, unspecified: Secondary | ICD-10-CM

## 2011-01-16 ENCOUNTER — Other Ambulatory Visit: Payer: Self-pay | Admitting: Gastroenterology

## 2011-03-25 ENCOUNTER — Other Ambulatory Visit: Payer: Self-pay | Admitting: Internal Medicine

## 2011-03-25 MED ORDER — AMLODIPINE BESYLATE 5 MG PO TABS: 5.0000 mg | ORAL_TABLET | Freq: Every day | ORAL | Status: DC

## 2011-05-06 ENCOUNTER — Telehealth: Payer: Self-pay | Admitting: Radiology

## 2011-05-06 NOTE — Telephone Encounter (Signed)
If things have settled down with his bowels, he shoud try the pravastatin again  rx for 1 year if needed

## 2011-05-06 NOTE — Telephone Encounter (Signed)
.  left message to have patient return my call.  

## 2011-05-06 NOTE — Telephone Encounter (Signed)
Patient has been off cholesterol medication since his colonoscopy. He wants to know what cholesterol medication he should start.

## 2011-05-08 NOTE — Telephone Encounter (Signed)
.  left message on cell to have patient return my call.

## 2011-05-08 NOTE — Telephone Encounter (Signed)
Spoke with patient and advised results   

## 2011-07-01 ENCOUNTER — Encounter: Payer: Self-pay | Admitting: Internal Medicine

## 2011-07-01 ENCOUNTER — Ambulatory Visit (INDEPENDENT_AMBULATORY_CARE_PROVIDER_SITE_OTHER): Payer: Medicare Other | Admitting: Internal Medicine

## 2011-07-01 VITALS — BP 130/70 | HR 57 | Temp 98.0°F | Ht 67.0 in | Wt 193.0 lb

## 2011-07-01 DIAGNOSIS — I10 Essential (primary) hypertension: Secondary | ICD-10-CM

## 2011-07-01 DIAGNOSIS — J069 Acute upper respiratory infection, unspecified: Secondary | ICD-10-CM | POA: Insufficient documentation

## 2011-07-01 DIAGNOSIS — N400 Enlarged prostate without lower urinary tract symptoms: Secondary | ICD-10-CM

## 2011-07-01 DIAGNOSIS — I2510 Atherosclerotic heart disease of native coronary artery without angina pectoris: Secondary | ICD-10-CM

## 2011-07-01 DIAGNOSIS — E785 Hyperlipidemia, unspecified: Secondary | ICD-10-CM

## 2011-07-01 LAB — LIPID PANEL
HDL: 41.6 mg/dL (ref >39.00)
Triglycerides: 225 mg/dL — ABNORMAL HIGH (ref 0.0–149.0)
VLDL: 45 mg/dL — ABNORMAL HIGH (ref 0.0–40.0)

## 2011-07-01 LAB — HEPATIC FUNCTION PANEL
Bilirubin, Direct: 0 mg/dL (ref 0.0–0.3)
Total Bilirubin: 0.2 mg/dL — ABNORMAL LOW (ref 0.3–1.2)
Total Protein: 7.4 g/dL (ref 6.0–8.3)

## 2011-07-01 LAB — LDL CHOLESTEROL, DIRECT: Direct LDL: 125.7 mg/dL

## 2011-07-01 NOTE — Assessment & Plan Note (Signed)
Seems to be viral Will try empiric antibiotic if worsening as week goes on

## 2011-07-01 NOTE — Assessment & Plan Note (Signed)
Seems to be stable No changes needed

## 2011-07-01 NOTE — Assessment & Plan Note (Signed)
Voiding okay Still has frequency but doesn't want meds

## 2011-07-01 NOTE — Assessment & Plan Note (Signed)
BP Readings from Last 3 Encounters:  07/01/11 130/70  12/28/10 110/60  12/24/10 130/70   Good control  No changes needed Lab Results  Component Value Date   CREATININE 1.10 10/21/2010

## 2011-07-01 NOTE — Progress Notes (Signed)
Subjective:    Patient ID: Patrick [name], male    DOB: 02-03-1941, 71 y.o.   MRN: [id number]  HPI Started with cold last week Better now but still with cough Mostly dry No fever No SOB---just feels tired  Working a lot---hauling grain daily. Lots of dust Believes this is what caused the cold  Feels his memory is off some Forgot his address briefly--name recall issues Back on the pravastatin  Diarrhea has resolved occ flare up but mostly better  No chest pain No palpitations No edema  Current Outpatient Prescriptions on File Prior to Visit  Medication Sig Dispense Refill  . amLODipine (NORVASC) 5 MG tablet Take 1 tablet (5 mg total) by mouth daily.  90 tablet  0  . Ascorbic Acid (VITAMIN C) 1000 MG tablet Take 1,000 mg by mouth daily.        Marland Kitchen aspirin 81 MG tablet Take 81 mg by mouth daily.        . Coenzyme Q10 (CO Q-10) 100 MG CAPS Take by mouth daily.        . fish oil-omega-3 fatty acids 1000 MG capsule Take 2 g by mouth daily.        Marland Kitchen lisinopril-hydrochlorothiazide (PRINZIDE,ZESTORETIC) 20-12.5 MG per tablet Take 1 tablet by mouth daily.          Allergies  Allergen Reactions  . Atorvastatin     REACTION: leg cramps  . Ezetimibe-Simvastatin     REACTION: cramps    Past Medical History  Diagnosis Date  . CAD (coronary artery disease)      --s/p Taxus DES to RCA and PDA in 2006,  --Myoview 8/11: Walked 12:37. EF 57%. no ischemia or scar  . Hypertension   . Hyperlipidemia   . Allergy   . Bradycardia   . GERD (gastroesophageal reflux disease)   . Colon polyps   . Diverticulitis   . BPH (benign prostatic hypertrophy)   . Sleep apnea   . Impaired fasting glucose   . Pneumonia   . Syncope     Past Surgical History  Procedure Date  . Angioplasty / stenting femoral     percutaneous transluminal coronary  angioplasty and stenting of the right coronary artery and posterior   . Patent ductus arterious repair     with Taxus drug-eluting stents February 2006.    Marland Kitchen Pilonidal cyst / sinus excision     Family History  Problem Relation Age of Onset  . Coronary artery disease Father   . Hypertension Neg Hx   . Diabetes Neg Hx     History   Social History  . Marital Status: Single    Spouse Name: N/A    Number of Children: 3  . Years of Education: N/A   Occupational History  . driver part time    Social History Main Topics  . Smoking status: Former Smoker    Quit date: 05/13/1978  . Smokeless tobacco: Never Used  . Alcohol Use: Yes     wine nightly if not working  . Drug Use: No  . Sexually Active: Not on file   Other Topics Concern  . Not on file   Social History Narrative  . No narrative on file   Review of Systems Went on mission trip to [place] again Sleeps okay weight is up some   Objective:   Physical Exam  Constitutional: He appears well-developed and well-nourished. No distress.  Neck: Normal range of motion. Neck supple. No thyromegaly present.  Cardiovascular: Normal rate, regular rhythm and normal heart sounds.  Exam reveals no gallop.   No murmur heard. Pulmonary/Chest: Effort normal and breath sounds normal. No respiratory distress. He has no wheezes. He has no rales.  Abdominal: Soft. There is no tenderness.  Musculoskeletal: He exhibits no edema and no tenderness.  Lymphadenopathy:    He has no cervical adenopathy.  Psychiatric: He has a normal mood and affect. His behavior is normal. Judgment and thought content normal.          Assessment & Plan:

## 2011-07-01 NOTE — Assessment & Plan Note (Signed)
Back on the pravastatin Didn't seem to be the cause of the diarrhea Will check labs

## 2011-07-19 ENCOUNTER — Other Ambulatory Visit: Payer: Self-pay | Admitting: *Deleted

## 2011-07-19 MED ORDER — AMLODIPINE BESYLATE 5 MG PO TABS: 5.0000 mg | ORAL_TABLET | Freq: Every day | ORAL | Status: DC

## 2011-07-19 MED ORDER — PRAVASTATIN SODIUM 40 MG PO TABS: 40.0000 mg | ORAL_TABLET | Freq: Every day | ORAL | Status: DC

## 2011-11-12 ENCOUNTER — Other Ambulatory Visit: Payer: Self-pay | Admitting: *Deleted

## 2011-11-12 MED ORDER — LISINOPRIL-HYDROCHLOROTHIAZIDE 20-12.5 MG PO TABS: 1.0000 | ORAL_TABLET | Freq: Every day | ORAL | Status: DC

## 2011-11-25 ENCOUNTER — Observation Stay (HOSPITAL_COMMUNITY)
Admission: EM | Admit: 2011-11-25 | Discharge: 2011-11-26 | Disposition: A | Discharge status: Home / Self Care | Payer: Medicare Other | Attending: Cardiovascular Disease | Admitting: Cardiovascular Disease

## 2011-11-25 ENCOUNTER — Encounter (HOSPITAL_COMMUNITY): Payer: Self-pay

## 2011-11-25 ENCOUNTER — Emergency Department (HOSPITAL_COMMUNITY): Payer: Medicare Other

## 2011-11-25 DIAGNOSIS — Z9861 Coronary angioplasty status: Secondary | ICD-10-CM | POA: Insufficient documentation

## 2011-11-25 DIAGNOSIS — R072 Precordial pain: Secondary | ICD-10-CM

## 2011-11-25 DIAGNOSIS — T82897A Other specified complication of cardiac prosthetic devices, implants and grafts, initial encounter: Secondary | ICD-10-CM | POA: Insufficient documentation

## 2011-11-25 DIAGNOSIS — E785 Hyperlipidemia, unspecified: Secondary | ICD-10-CM | POA: Insufficient documentation

## 2011-11-25 DIAGNOSIS — R0602 Shortness of breath: Secondary | ICD-10-CM | POA: Insufficient documentation

## 2011-11-25 DIAGNOSIS — R079 Chest pain, unspecified: Principal | ICD-10-CM | POA: Insufficient documentation

## 2011-11-25 DIAGNOSIS — I498 Other specified cardiac arrhythmias: Secondary | ICD-10-CM | POA: Insufficient documentation

## 2011-11-25 DIAGNOSIS — Y831 Surgical operation with implant of artificial internal device as the cause of abnormal reaction of the patient, or of later complication, without mention of misadventure at the time of the procedure: Secondary | ICD-10-CM | POA: Insufficient documentation

## 2011-11-25 DIAGNOSIS — K219 Gastro-esophageal reflux disease without esophagitis: Secondary | ICD-10-CM | POA: Insufficient documentation

## 2011-11-25 DIAGNOSIS — I200 Unstable angina: Secondary | ICD-10-CM

## 2011-11-25 DIAGNOSIS — I10 Essential (primary) hypertension: Secondary | ICD-10-CM | POA: Insufficient documentation

## 2011-11-25 DIAGNOSIS — I2510 Atherosclerotic heart disease of native coronary artery without angina pectoris: Secondary | ICD-10-CM | POA: Insufficient documentation

## 2011-11-25 HISTORY — DX: Thoracic aortic ectasia: I77.810

## 2011-11-25 LAB — CBC
HCT: 43.1 % (ref 39.0–52.0)
Hemoglobin: 15 g/dL (ref 13.0–17.0)
MCH: 31.7 pg (ref 26.0–34.0)
MCHC: 35.3 g/dL (ref 30.0–36.0)
MCV: 90 fL (ref 78.0–100.0)
MCV: 90 fL (ref 78.0–100.0)
Platelets: 241 10*3/uL (ref 150–400)
RBC: 4.79 MIL/uL (ref 4.22–5.81)
RDW: 13 % (ref 11.5–15.5)
WBC: 5.5 10*3/uL (ref 4.0–10.5)

## 2011-11-25 LAB — HEPATIC FUNCTION PANEL
ALT: 22 U/L (ref 0–53)
Alkaline Phosphatase: 97 U/L (ref 39–117)
Indirect Bilirubin: 0.3 mg/dL (ref 0.3–0.9)
Total Bilirubin: 0.4 mg/dL (ref 0.3–1.2)
Total Protein: 7.5 g/dL (ref 6.0–8.3)

## 2011-11-25 LAB — D-DIMER, QUANTITATIVE: D-Dimer, Quant: 0.35 ug/mL-FEU (ref 0.00–0.48)

## 2011-11-25 LAB — BASIC METABOLIC PANEL
BUN: 16 mg/dL (ref 6–23)
CO2: 25 mEq/L (ref 19–32)
Chloride: 101 mEq/L (ref 96–112)
Creatinine, Ser: 1 mg/dL (ref 0.50–1.35)
Potassium: 4.1 mEq/L (ref 3.5–5.1)

## 2011-11-25 LAB — TSH: TSH: 0.984 u[IU]/mL (ref 0.350–4.500)

## 2011-11-25 LAB — PROTIME-INR: Prothrombin Time: 13.6 seconds (ref 11.6–15.2)

## 2011-11-25 LAB — CARDIAC PANEL(CRET KIN+CKTOT+MB+TROPI)
CK, MB: 1.9 ng/mL (ref 0.3–4.0)
Troponin I: <0.3 ng/mL (ref <0.30)

## 2011-11-25 LAB — CREATININE, SERUM: Creatinine, Ser: 0.93 mg/dL (ref 0.50–1.35)

## 2011-11-25 LAB — POCT I-STAT TROPONIN I: Troponin i, poc: 0 ng/mL (ref 0.00–0.08)

## 2011-11-25 MED ORDER — LISINOPRIL-HYDROCHLOROTHIAZIDE 20-12.5 MG PO TABS: 1.0000 | ORAL_TABLET | Freq: Every day | ORAL | Status: DC

## 2011-11-25 MED ORDER — SODIUM CHLORIDE 0.9 % IV SOLN
1.0000 mL/kg/h | INTRAVENOUS | Status: DC
  Administered 2011-11-26: 1 mL/kg/h via INTRAVENOUS

## 2011-11-25 MED ORDER — NON FORMULARY: 40.0000 mg | Freq: Every day | Status: DC

## 2011-11-25 MED ORDER — ONDANSETRON HCL 4 MG/2ML IJ SOLN: 4.0000 mg | Freq: Four times a day (QID) | INTRAMUSCULAR | Status: DC | PRN

## 2011-11-25 MED ORDER — ACETAMINOPHEN 325 MG PO TABS: 650.0000 mg | ORAL_TABLET | ORAL | Status: DC | PRN

## 2011-11-25 MED ORDER — SODIUM CHLORIDE 0.9 % IV SOLN: 250.0000 mL | INTRAVENOUS | Status: DC | PRN

## 2011-11-25 MED ORDER — OMEGA-3 FATTY ACIDS 1000 MG PO CAPS: 2.0000 g | ORAL_CAPSULE | Freq: Every day | ORAL | Status: DC

## 2011-11-25 MED ORDER — PRAVASTATIN SODIUM 40 MG PO TABS
40.0000 mg | ORAL_TABLET | Freq: Every day | ORAL | Status: DC
  Filled 2011-11-25: qty 1

## 2011-11-25 MED ORDER — NITROGLYCERIN 0.4 MG SL SUBL: 0.4000 mg | SUBLINGUAL_TABLET | SUBLINGUAL | Status: DC | PRN

## 2011-11-25 MED ORDER — ASPIRIN 81 MG PO CHEW
324.0000 mg | CHEWABLE_TABLET | ORAL | Status: AC
  Filled 2011-11-25: qty 4

## 2011-11-25 MED ORDER — SODIUM CHLORIDE 0.9 % IJ SOLN
3.0000 mL | Freq: Two times a day (BID) | INTRAMUSCULAR | Status: DC
  Administered 2011-11-25: 21:00:00 via INTRAVENOUS

## 2011-11-25 MED ORDER — ASPIRIN 81 MG PO CHEW
324.0000 mg | CHEWABLE_TABLET | ORAL | Status: AC
Start: 2011-11-26 — End: 2011-11-26
  Administered 2011-11-26: 324 mg via ORAL
  Filled 2011-11-25: qty 4

## 2011-11-25 MED ORDER — HEPARIN SODIUM (PORCINE) 5000 UNIT/ML IJ SOLN
5000.0000 [IU] | Freq: Three times a day (TID) | INTRAMUSCULAR | Status: DC
  Administered 2011-11-25 – 2011-11-26 (×3): 5000 [IU] via SUBCUTANEOUS
  Filled 2011-11-25 (×6): qty 1

## 2011-11-25 MED ORDER — OMEGA-3-ACID ETHYL ESTERS 1 G PO CAPS
2.0000 g | ORAL_CAPSULE | Freq: Every day | ORAL | Status: DC
  Administered 2011-11-26: 2 g via ORAL
  Filled 2011-11-25: qty 2

## 2011-11-25 MED ORDER — SODIUM CHLORIDE 0.9 % IJ SOLN: 3.0000 mL | INTRAMUSCULAR | Status: DC | PRN

## 2011-11-25 MED ORDER — AMLODIPINE BESYLATE 5 MG PO TABS
5.0000 mg | ORAL_TABLET | Freq: Every day | ORAL | Status: DC
  Administered 2011-11-26: 5 mg via ORAL
  Filled 2011-11-25: qty 1

## 2011-11-25 MED ORDER — LISINOPRIL 20 MG PO TABS
20.0000 mg | ORAL_TABLET | Freq: Every day | ORAL | Status: DC
  Administered 2011-11-26: 20 mg via ORAL
  Filled 2011-11-25: qty 1

## 2011-11-25 MED ORDER — ASPIRIN 81 MG PO TABS: 81.0000 mg | ORAL_TABLET | Freq: Every day | ORAL | Status: DC

## 2011-11-25 MED ORDER — VITAMIN C 500 MG PO TABS
1000.0000 mg | ORAL_TABLET | Freq: Every day | ORAL | Status: DC
  Administered 2011-11-26: 1000 mg via ORAL
  Filled 2011-11-25: qty 2

## 2011-11-25 MED ORDER — ASPIRIN 81 MG PO CHEW
81.0000 mg | CHEWABLE_TABLET | Freq: Every day | ORAL | Status: DC
  Administered 2011-11-26: 81 mg via ORAL

## 2011-11-25 MED ORDER — HYDROCHLOROTHIAZIDE 12.5 MG PO CAPS
12.5000 mg | ORAL_CAPSULE | Freq: Every day | ORAL | Status: DC
  Administered 2011-11-26: 12.5 mg via ORAL
  Filled 2011-11-25: qty 1

## 2011-11-25 NOTE — H&P (Signed)
History and Physical  Patient ID: Chandlor Noecker Bosket MRN: [id number], DOB: 08/27/1940 Date of Encounter: 11/25/2011, 9:11 AM Primary Physician: Tillman Abide, MD Primary Cardiologist: Dr. 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 Over the past 1 week he has noticed that he is SOB beyond his normal when performing physical activity and walking distances of 500-600 feet. He denies SOB in ADL's around his home, orthopnea, and PND. Saturday (11/23/11) he experienced sudden onset of chest tightness, chest pressure - like an "elephant sitting on my chest" - and dull headache after he ate 2 hot dogs ~12:30-1:00pm. He denies radiating pain, nausea, vomiting, SOB and diaphoresis with this chest pain. He did not take any medications in an attempt to relieve his pain, but rested for the remainder of the day which did not provide significant relief. He woke Sunday morning with continued chest pain and pressure and again rested which did not provide significant relief. He woke this morning ~5:30am with continued chest pain and pressure and called his neighbor who brought him to the ED. He has taken his home medications as scheduled but no additional medications to provide relief. He currently denies chest pain in the ED but endorses a dull headache over his frontal and occipital regions that he rates as 3/10 which is unusual for him. Of note, he also took a 7.5hr flight to and from [place] in May of this year. He denies any LEE or syncope.  He is well known to Dr. Gala Romney but has not seen him in ~2 years and acknowledges this gap in time. He denies interim  cardiac care at a site other than  or [facility] and follows regularly with his PCP. He notes that he was asymptomatic when he learned of his MI in 2006 - he was sent by PCP for c/o SOB at that time, had an abnormal nuc resulting in his cath.  Past Medical History  Diagnosis Date  . CAD (coronary artery disease)     a. s/p Taxus DES to RCA and PDA in 2006 .b. Myoview 8/11: Walked 12:37. EF 57%. no ischemia or scar  . Hypertension   . Hyperlipidemia   . Allergy   . Bradycardia     Precluding BB use.  Marland Kitchen GERD (gastroesophageal reflux disease)   . Colon polyps   . Diverticulitis   . BPH (benign prostatic hypertrophy)   . Sleep apnea   . Impaired fasting glucose   . Pneumonia   . Syncope      Most Recent Cardiac Studies: Cardiac Cath 2006 ANGIOGRAPHIC DATA:  1. Ventriculography was performed in the RAO projection. Overall systolic  function appeared to be preserved. There was ventricular ectopy. Despite  this, overall systolic function appeared well preserved.  2. The right coronary artery is a dominant vessel providing a posterior  descending and posterolateral branch both of which are moderate in size.  The proximal portion of the right coronary artery just passed the ostium  demonstrates a 70-75% fairly smooth-appearing lesion. Distal to this,  approximately 15 mm, is a 50% area of focal stenosis. There is a fair  amount of Luminal irregularity throughout going into the distal vessel  without high grade focal stenosis. There is segmental tandem areas of  plaquing of about 70% in the mid posterior descending and about 40-50%  narrowing leading into the posterolateral branch.  3. The left main tapers at its distal most aspect and still appears to be  well in excess of 2 mm. Nonetheless, there is about 40-50% distal  tapering that involves both the takeoff of the circumflex as well as the  proximal LAD. The LAD in the midportion at the takeoff of the diagonal  probably has  about 40% at most 50% eccentric plaquing with the diagonal  having about 50-60% area of narrowing in its midportion. The distal LAD  tapers out after the origin of the second diagonal.  4. The ostium of the circumflex as previously noted probably has about 50  percent narrowing. This is eccentric followed by perhaps 30-40% plaquing  in the midvessel. The vessel then provides a bifurcating marginal  branch. The inferior branch of which has moderate disease of 50%. The  second marginal then also has about 40-50% proximal narrowing. The AV  circumflex is without critical disease. This wraps the posterolateral  segment.  CONCLUSION: Moderate high-grade disease of the right coronary artery with  moderate severe 80 degrees involving the left coronary arteries described in  the above text. The exact best option tear is not clear. I have reviewed the  films with Dr. Juanda Chance. I will review the films also with Dr. Arvilla Meres and we will make a final decision as to treatment plan. I have  explained this to the patient who understands. is Arturo Morton. Stuckey and that  is the end of the dictation. DESCRIPTION OF PROCEDURE: A 6 French sheath was placed in the right femoral  artery. Angiomax was administered per protocol. We used a 6 [place] JR4  guiding catheter. An Asahi soft coronary guidewire was advanced under  fluoroscopic guidance into the distal posterior descending artery. We then  advanced a 2.5 x 20 mm Quantum balloon over this wire and inflated it to 8  atmospheres in the distal aspect of the disease in the posterior descending  artery and 10 atmospheres in the proximal aspect of the diseased vessel in  the posterior descending artery. We then positioned a 2.5 x 24 mm TAXUS  drug eluting stent in the posterior descending artery across the diseased  segment of vessel. The stent was deployed at 7 atmospheres. We then back  with a 2.5 x 20 mm Quantum balloon and inflated this to 15  atmospheres in  the proximal aspect of the stent and 13 atmospheres in the distal aspect of  the stent. Angiographic images at that point revealed an excellent result  with 0% residual stenosis at the stent site and TIMI-3 flow.  We then turned our attention to the proximal right coronary artery. We  positioned a 3.0 x 28 mm TAXUS drug eluting stent across the diseased  segment of vessel in the proximal right coronary artery. The stent was  deployed at 16 atmospheres. We then went back with 12 mm Quantum balloon  and inflated this to 22 atmospheres in the proximal portion of the stent and  10 atmospheres in the mid portion of the stent. Intracoronary nitroglycerin  was administered. Images were obtained showing patency of the proximal  right coronary artery with 0% residual stenosis at the stent site and TIMI-3  flow.  COMPLICATIONS: None.  1. Placement  of a drug eluting in the posterior descending artery. A long  80% stenosis was reduced to 0% residual with TIMI-3 flow.  2. Successful percutaneous transluminal coronary angioplasty with placement  of a drug eluting stent in the proximal right coronary artery. A long  80% stenosis was reduced to 0% residual TIMI-3 flow.  Nuclear 12/2009 Overall Impression  Exercise Capacity: Good exercise capacity.  BP Response: Hypertensive blood pressure response.  Clinical Symptoms: Fatigue  ECG Impression: No significant ST segment change suggestive of ischemia.  Overall Impression: Normal stress nuclear study.    Surgical History:  Past Surgical History  Procedure Date  . Angioplasty     percutaneous transluminal coronary  angioplasty and stenting of the right coronary artery and posterior   . Pilonidal cyst / sinus excision      Home Meds: Prior to Admission medications   Medication Sig Start Date End Date Taking? Authorizing Provider  amLODipine (NORVASC) 5 MG tablet Take 1 tablet (5 mg total) by mouth daily. 07/19/11  Yes Karie Schwalbe,  MD  Ascorbic Acid (VITAMIN C) 1000 MG tablet Take 1,000 mg by mouth daily.     Yes Historical Provider, MD  aspirin 81 MG tablet Take 81 mg by mouth daily.     Yes Historical Provider, MD  Coenzyme Q10 (CO Q-10) 100 MG CAPS Take by mouth daily.     Yes Historical Provider, MD  fish oil-omega-3 fatty acids 1000 MG capsule Take 2 g by mouth daily.     Yes Historical Provider, MD  lisinopril-hydrochlorothiazide (PRINZIDE,ZESTORETIC) 20-12.5 MG per tablet Take 1 tablet by mouth daily. 11/12/11  Yes Karie Schwalbe, MD  pravastatin (PRAVACHOL) 40 MG tablet Take 1 tablet (40 mg total) by mouth daily. 07/19/11  Yes Karie Schwalbe, MD    Allergies:  Allergies  Allergen Reactions  . Atorvastatin     REACTION: leg cramps  . Ezetimibe-Simvastatin     REACTION: cramps    History   Social History  . Marital Status: Single    Spouse Name: N/A    Number of Children: 3  . Years of Education: N/A   Occupational History  . driver part time    Social History Main Topics  . Smoking status: Former Smoker    Quit date: 05/13/1978  . Smokeless tobacco: Never Used  . Alcohol Use: Yes     wine nightly if not working  . Drug Use: No  . Sexually Active: Not on file   Other Topics Concern  . Not on file   Social History Narrative  . No narrative on file     Family History  Problem Relation Age of Onset  . Coronary artery disease Father   . Hypertension Neg Hx   . Diabetes Neg Hx     Review of Systems: General: negative for chills, fever, night sweats or weight changes.  Cardiovascular: +chest pain, +shortness of breath, +dyspnea on exertion. Negative for edema, orthopnea, palpitations, and paroxysmal nocturnal dyspnea. Dermatological: negative for rash Respiratory: +dry, non-productive cough. Negative for wheezing Urologic: +increased frequency x 1 night. Negative for hematuria.   Abdominal: negative for nausea, vomiting, diarrhea, bright red blood per rectum, melena, or  hematemesis Neurologic: negative for visual changes, syncope, or dizziness All other systems reviewed and are otherwise negative except as noted above.  Labs:   Lab Results  Component Value Date   WBC 5.5 11/25/2011   HGB 15.0 11/25/2011   HCT 43.1 11/25/2011   MCV 90.0 11/25/2011  PLT 247 11/25/2011     Lab 11/25/11 0747  NA 138  K 4.1  CL 101  CO2 25  BUN 16  CREATININE 1.00  CALCIUM 9.2  PROT --  BILITOT --  ALKPHOS --  ALT --  AST --  GLUCOSE 109*   Lab Results  Component Value Date   CHOL 184 07/01/2011   HDL 41.60 07/01/2011   LDLCALC 96 06/22/2010   TRIG 225.0* 07/01/2011   POC troponin 0.00  Radiology/Studies:  1. Chest Port 1 View 11/25/2011  *RADIOLOGY REPORT*  Clinical Data: Chest pain.  PORTABLE CHEST - 1 VIEW  Comparison: 12/25/2009.  Findings: Trachea is midline.  Heart size normal.  Linear scarring at the left lung base.  Lungs are otherwise clear.  No pleural fluid.  IMPRESSION: No acute findings.  Original Report Authenticated By: Reyes Ivan, M.D.    EKG: Sinus bradycardia with non-specific intra-ventricular conduction delay, somewhat more pronounced TW in V2 than prior, otherwise no changes  Physical Exam: Blood pressure 154/73, pulse 56, temperature 98.3 F (36.8 C), temperature source Oral, height 5\' 7"  (1.702 m), weight 190 lb (86.183 kg), SpO2 95.00%. General: Well developed, well nourished, overweight male, in no acute distress. Head: Normocephalic, atraumatic, sclera non-icteric, no xanthomas, nares are without discharge.  Neck: Negative for carotid bruits. JVD not elevated. Lungs: Clear bilaterally to auscultation without wheezes, rales, or rhonchi. Breathing is unlabored. Heart: Reg rhythm (somewhat bradycardic) with S1 S2. No murmurs, rubs, or gallops appreciated. Abdomen: Soft, non-tender, non-distended with normoactive bowel sounds. No hepatomegaly. No rebound/guarding. No obvious abdominal masses. Msk:  Strength and tone appear normal  for age. Extremities: No clubbing or cyanosis. No edema.  Distal pedal pulses are 2+ and equal bilaterally. Neuro: Alert and oriented X 3. Moves all extremities spontaneously. Psych:  Responds to questions appropriately with a normal affect.    ASSESSMENT AND PLAN:   71 y/o male with PMH CAD with DES to RCA and PDA, hypertension, hyperlipidemia, and bradycardia who presents with chest pain.   1. Chest pain/DOE: typical features include exertional SOB although CP is somewhat more atypical. Cardiac enzymes are negative x 1 and EKG is nonacute. Will cycle cardiac enzymes & plan for cath to define anatomy tomorrow. Will check 2D echo. Check d-dimer given hx of recent travel to [place], although pt is not tachycardic or tachypnic. Add heparin only if CE's positive or pain recurs.  2. CAD s/p DES to RCA/PDA in 2006, moderate residual dz in left system: continue ASA, statin. Not a candidate for BB due to bradycardia. See above.  3. Hypertension: continue amlodipine and lisinopril-HCTZ.   4. Hyperlipidemia: continue pravastatin. Note myalgias on prior Lipitor/Vytorin.  5. GERD: consider trial of PPI if chest pain not found to be cardiac.  Signed, Ronie Spies PA-C 11/25/2011, 9:11 AM  Attending Note:   The patient was seen and examined.  Agree with assessment and plan as noted above.  Pt is a very pleasant gentleman with hx of HTN and CAD.  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  He reports atypical cp starting Saturday.    Exam is normal ( except for bradycardia).  ECG is unremarkable  Cardiac enzymes are negative.  His progressive dyspnea symptoms are disturbing and I think he needs a cath.  I dont think a stress myoview would answer all of our questions.  Echo today.  Cath  tomorrow.   Needs education on low salt diet.   Vesta Mixer, Montez Hageman., MD, [facility] 11/25/2011, 11:39 AM

## 2011-11-25 NOTE — ED Notes (Signed)
Chest pain x2 days  Mid sternal nonradiaitng

## 2011-11-25 NOTE — Progress Notes (Signed)
  Echocardiogram 2D Echocardiogram has been performed.  Patrick [name] 11/25/2011, 2:52 PM

## 2011-11-25 NOTE — ED Provider Notes (Signed)
History     CSN: [id number]  Arrival date & time 11/25/11  0701   First MD Initiated Contact with Patient 11/25/11 [phone]      Chief Complaint  Patient presents with  . Chest Pain  . Shortness of Breath    (Consider location/radiation/quality/duration/timing/severity/associated sxs/prior treatment) Patient is a 71 y.o. male presenting with chest pain. The history is provided by the patient.  Chest Pain Primary symptoms include shortness of breath and cough. Pertinent negatives for primary symptoms include no fever, no palpitations, no nausea and no vomiting.  Pertinent negatives for associated symptoms include no diaphoresis.   PT has hx of cardiac stent and htn.  He denies smoking or dm.   He has had progressive DOE and weakness for a few weeks.  For the last 3 days, he has had central nonradiating cp.  He says it feels "like an elephant sitting on my chest."  It is not assoc. With n/v/sweating.  He woke up with it this am around 0530.  The CP is gone now.  He denies hx of pud.  He has had a np cough.  He denies leg pain or swelling.    Past Medical History  Diagnosis Date  . CAD (coronary artery disease)      --s/p Taxus DES to RCA and PDA in 2006,  --Myoview 8/11: Walked 12:37. EF 57%. no ischemia or scar  . Hypertension   . Hyperlipidemia   . Allergy   . Bradycardia   . GERD (gastroesophageal reflux disease)   . Colon polyps   . Diverticulitis   . BPH (benign prostatic hypertrophy)   . Sleep apnea   . Impaired fasting glucose   . Pneumonia   . Syncope     Past Surgical History  Procedure Date  . Angioplasty / stenting femoral     percutaneous transluminal coronary  angioplasty and stenting of the right coronary artery and posterior   . Patent ductus arterious repair     with Taxus drug-eluting stents February 2006.  Marland Kitchen Pilonidal cyst / sinus excision     Family History  Problem Relation Age of Onset  . Coronary artery disease Father   . Hypertension Neg Hx   .  Diabetes Neg Hx     History  Substance Use Topics  . Smoking status: Former Smoker    Quit date: 05/13/1978  . Smokeless tobacco: Never Used  . Alcohol Use: Yes     wine nightly if not working      Review of Systems  Constitutional: Negative for fever, chills and diaphoresis.  HENT: Negative for neck pain.   Eyes: Negative for redness.  Respiratory: Positive for cough and shortness of breath. Negative for chest tightness.   Cardiovascular: Positive for chest pain. Negative for palpitations and leg swelling.  Gastrointestinal: Negative for nausea and vomiting.  Skin: Negative for rash.  Neurological: Negative for headaches.  Hematological: Does not bruise/bleed easily.  Psychiatric/Behavioral: Negative for confusion.  All other systems reviewed and are negative.    Allergies  Atorvastatin and Ezetimibe-simvastatin  Home Medications   Current Outpatient Rx  Name Route Sig Dispense Refill  . AMLODIPINE BESYLATE 5 MG PO TABS Oral Take 1 tablet (5 mg total) by mouth daily. 90 tablet 3  . VITAMIN C 1000 MG PO TABS Oral Take 1,000 mg by mouth daily.      . ASPIRIN 81 MG PO TABS Oral Take 81 mg by mouth daily.      Marland Kitchen CO  Q-10 100 MG PO CAPS Oral Take by mouth daily.      . OMEGA-3 FATTY ACIDS 1000 MG PO CAPS Oral Take 2 g by mouth daily.      Marland Kitchen LISINOPRIL-HYDROCHLOROTHIAZIDE 20-12.5 MG PO TABS Oral Take 1 tablet by mouth daily. 90 tablet 3  . PRAVASTATIN SODIUM 40 MG PO TABS Oral Take 1 tablet (40 mg total) by mouth daily. 90 tablet 3    BP 154/73  Pulse 56  Temp 98.3 F (36.8 C) (Oral)  Ht 5\' 7"  (1.702 m)  Wt 190 lb (86.183 kg)  BMI 29.76 kg/m2  SpO2 95%  Physical Exam  Nursing note and vitals reviewed. Constitutional: He is oriented to person, place, and time. He appears well-developed and well-nourished. No distress.  HENT:  Head: Normocephalic and atraumatic.  Eyes: Conjunctivae and EOM are normal.  Neck: Normal range of motion. Neck supple.       No bruits, no  jvd  Cardiovascular: Normal rate and intact distal pulses.   No murmur heard.      Pt places clenched fist on center of chest as he describes sxs. "Levine's sign"  Pulmonary/Chest: Effort normal. No respiratory distress.  Abdominal: Soft. Bowel sounds are normal. He exhibits no distension. There is no tenderness.  Musculoskeletal: Normal range of motion. He exhibits no edema and no tenderness.  Neurological: He is alert and oriented to person, place, and time.  Skin: Skin is warm and dry. He is not diaphoretic.  Psychiatric: He has a normal mood and affect. Thought content normal.    ED Course  Procedures (including critical care time)chest heaviness in pt with known cad.  Resolved.  sxs c/w unstable angina.  Will do cxr, ecg, labs and consult cards.   Labs Reviewed  CBC  BASIC METABOLIC PANEL   No results found.   No diagnosis found.  ECG Sinus brady hr 54 bpm Nl axis Nonspecific IVCD Nl st and tw No significant change from 07/05/11  8:49 AM Still asx.  Spoke with cards. They will come eval pt.  Personally reviewed cxr and labs   MDM  Chest pain Suspect unstable angina.  No evidence of stemi or nonstemi.  No pneumonia, chf. Or suggestion of pe. No ptx.           Cheri Guppy, MD 11/25/11 [phone]

## 2011-11-25 NOTE — Plan of Care (Signed)
Problem: Limited Adherence to Nutrition-Related Recommendations (NB-1.6) Goal: Nutrition education Formal process to instruct or train a patient/client in a skill or to impart knowledge to help patients/clients voluntarily manage or modify food choices and eating behavior to maintain or improve health.  Outcome: Completed/Met Date Met:  11/25/11 RD consult for Heart Healthy diet education. Reviewed guidelines and recommendations. Per patient recall, he eats fast food "a couple days a week" due to convenience. States he does not add salt to his food. Handouts provided from [employer]. Reports a good appetite. BMI = 28.4 kg/me (Overweight). No further nutrition intervention indicated at this time. Please consult RD as needed.  Alger Memos, RD, LDN Pager #: [phone] After-Hours Pager #: [phone]

## 2011-11-26 ENCOUNTER — Encounter (HOSPITAL_COMMUNITY): Payer: Self-pay | Admitting: Physician Assistant

## 2011-11-26 ENCOUNTER — Encounter (HOSPITAL_COMMUNITY)
Admission: EM | Disposition: A | Discharge status: Home / Self Care | Payer: Self-pay | Source: Home / Self Care | Attending: Emergency Medicine

## 2011-11-26 DIAGNOSIS — I200 Unstable angina: Secondary | ICD-10-CM

## 2011-11-26 DIAGNOSIS — I2510 Atherosclerotic heart disease of native coronary artery without angina pectoris: Secondary | ICD-10-CM

## 2011-11-26 HISTORY — PX: LEFT HEART CATHETERIZATION WITH CORONARY ANGIOGRAM: SHX5451

## 2011-11-26 LAB — BASIC METABOLIC PANEL
GFR calc Af Amer: >90 mL/min (ref >90)
GFR calc non Af Amer: 82 mL/min — ABNORMAL LOW (ref >90)
Glucose, Bld: 120 mg/dL — ABNORMAL HIGH (ref 70–99)
Potassium: 4.3 mEq/L (ref 3.5–5.1)
Sodium: 138 mEq/L (ref 135–145)

## 2011-11-26 LAB — CARDIAC PANEL(CRET KIN+CKTOT+MB+TROPI)
CK, MB: 1.7 ng/mL (ref 0.3–4.0)
Relative Index: INVALID (ref 0.0–2.5)
Total CK: 56 U/L (ref 7–232)
Troponin I: <0.3 ng/mL (ref <0.30)

## 2011-11-26 LAB — CBC
HCT: 41.7 % (ref 39.0–52.0)
Hemoglobin: 14.8 g/dL (ref 13.0–17.0)
MCH: 32 pg (ref 26.0–34.0)
MCHC: 34.4 g/dL (ref 30.0–36.0)
MCV: 90.1 fL (ref 78.0–100.0)
Platelets: 258 10*3/uL (ref 150–400)
Platelets: 213 10*3/uL (ref 150–400)
RDW: 13 % (ref 11.5–15.5)
RDW: 13 % (ref 11.5–15.5)

## 2011-11-26 LAB — LIPID PANEL
Cholesterol: 174 mg/dL (ref 0–200)
HDL: 38 mg/dL — ABNORMAL LOW (ref >39)
Triglycerides: 218 mg/dL — ABNORMAL HIGH (ref <150)

## 2011-11-26 LAB — CREATININE, SERUM: GFR calc Af Amer: >90 mL/min (ref >90)

## 2011-11-26 SURGERY — LEFT HEART CATHETERIZATION WITH CORONARY ANGIOGRAM
Anesthesia: LOCAL

## 2011-11-26 MED ORDER — HEPARIN (PORCINE) IN NACL 2-0.9 UNIT/ML-% IJ SOLN
INTRAMUSCULAR | Status: AC
  Filled 2011-11-26: qty 2000

## 2011-11-26 MED ORDER — NITROGLYCERIN 0.2 MG/ML ON CALL CATH LAB
INTRAVENOUS | Status: AC
  Filled 2011-11-26: qty 1

## 2011-11-26 MED ORDER — SODIUM CHLORIDE 0.9 % IV SOLN: 250.0000 mL | INTRAVENOUS | Status: DC

## 2011-11-26 MED ORDER — ENOXAPARIN SODIUM 40 MG/0.4ML ~~LOC~~ SOLN: 40.0000 mg | SUBCUTANEOUS | Status: DC

## 2011-11-26 MED ORDER — OXYCODONE-ACETAMINOPHEN 5-325 MG PO TABS: 1.0000 | ORAL_TABLET | ORAL | Status: DC | PRN

## 2011-11-26 MED ORDER — ACETAMINOPHEN 325 MG PO TABS: 650.0000 mg | ORAL_TABLET | ORAL | Status: DC | PRN

## 2011-11-26 MED ORDER — ONDANSETRON HCL 4 MG/2ML IJ SOLN
INTRAMUSCULAR | Status: AC
  Filled 2011-11-26: qty 2

## 2011-11-26 MED ORDER — SODIUM CHLORIDE 0.9 % IV SOLN
1.0000 mL/kg/h | INTRAVENOUS | Status: DC
Start: 2011-11-26 — End: 2011-11-26

## 2011-11-26 MED ORDER — ATROPINE SULFATE 1 MG/ML IJ SOLN
INTRAMUSCULAR | Status: AC
  Filled 2011-11-26: qty 1

## 2011-11-26 MED ORDER — SODIUM CHLORIDE 0.9 % IJ SOLN: 3.0000 mL | Freq: Two times a day (BID) | INTRAMUSCULAR | Status: DC

## 2011-11-26 MED ORDER — SODIUM CHLORIDE 0.9 % IV SOLN
INTRAVENOUS | Status: AC
  Administered 2011-11-26 (×2): via INTRAVENOUS

## 2011-11-26 MED ORDER — FENTANYL CITRATE 0.05 MG/ML IJ SOLN
INTRAMUSCULAR | Status: AC
  Filled 2011-11-26: qty 2

## 2011-11-26 MED ORDER — ONDANSETRON HCL 4 MG/2ML IJ SOLN: 4.0000 mg | Freq: Four times a day (QID) | INTRAMUSCULAR | Status: DC | PRN

## 2011-11-26 MED ORDER — SODIUM CHLORIDE 0.9 % IJ SOLN: 3.0000 mL | INTRAMUSCULAR | Status: DC | PRN

## 2011-11-26 MED ORDER — LIDOCAINE HCL (PF) 1 % IJ SOLN
INTRAMUSCULAR | Status: AC
  Filled 2011-11-26: qty 30

## 2011-11-26 MED ORDER — MIDAZOLAM HCL 2 MG/2ML IJ SOLN
INTRAMUSCULAR | Status: AC
  Filled 2011-11-26: qty 2

## 2011-11-26 MED ORDER — DOPAMINE-DEXTROSE 3.2-5 MG/ML-% IV SOLN
INTRAVENOUS | Status: AC
  Filled 2011-11-26: qty 250

## 2011-11-26 MED ORDER — NITROGLYCERIN 0.4 MG SL SUBL: 0.4000 mg | SUBLINGUAL_TABLET | SUBLINGUAL | Status: DC | PRN

## 2011-11-26 NOTE — Interval H&P Note (Signed)
History and Physical Interval Note:  11/26/2011 9:44 AM  Patrick [name]  has presented today for surgery, with the diagnosis of chest pain  The various methods of treatment have been discussed with the patient and family. After consideration of risks, benefits and other options for treatment, the patient has consented to  Procedure(s) (LRB): LEFT HEART CATHETERIZATION WITH CORONARY ANGIOGRAM (N/A) as a surgical intervention .  The patient's history has been reviewed, patient examined, no change in status, stable for surgery.  I have reviewed the patients' chart and labs.  Questions were answered to the patient's satisfaction.     Elyn Aquas.

## 2011-11-26 NOTE — Discharge Summary (Signed)
Discharge Summary   Patient ID: Patrick [name] MRN: [id number], DOB/AGE: 10/02/1940 71 y.o. Admit date: 11/25/2011 D/C date:     11/26/2011  Primary Cardiologist: Dr. Gala Romney in 2011, but since his transition to the CHF team, Dr. Elease Hashimoto plans to follow him.  Primary Discharge Diagnoses:  1. Chest pain with moderate CAD by cath - 40-50% ISR of PDA stent, did not appear to obstruct flow -- will have OP myoview to assess 2. CAD (coronary artery disease)  - cath as above this admission - history: s/p Taxus DES to RCA and PDA in 2006, moview 8/11: Walked 12:37. EF 57%. no ischemia or scar  3. Mildly dilated aortic root by echo this admission - will need to be followed 4. Hypertension  5. Hyperlipidemia  6. Sinus bradycardia (not on BB due to this)    Secondary Discharge Diagnoses:  1. GERD (gastroesophageal reflux disease)  2. Colon polyps  3. Diverticulitis  4. BPH (benign prostatic hypertrophy)  5. Sleep apnea  6. Impaired fasting glucose  7. H/o pneumonia  8. H/o syncope   Hospital Course: 71 y/o white male with hx of CAD s/ph Taxus DES to RCA and PDA in 2006, HL, chronic bradycardia presented to [facility] with complaints of chest pain and SOB. Over the past week he has noticed increase in dyspnea with particularly exertional activities. He denied SOB with ADLs or at rest but noticed that when he did his physically demanding work he was more dyspneic than usual. On Saturday 7/13, he experienced sudden onset of chest tightness, chest pressure like an "elephant sitting on my chest" - and dull headache after he ate 2 hot dogs in the afternoon. He denied radiating pain, nausea, vomiting, SOB and diaphoresis with this chest pain. He did not take anything to attempt to relieve his pain; it persisted the rest of the day. He had more pain on Sunday 7/14. On 7/15, he called a neighbor to discuss the pain who recommended he proceed to ER. EKG showed no acute changes and troponin was  negative in the ER. Vital signs were stable - he was not hypoxic, tachycardic or tachypnic. D-dimer was checked which was negative. Cardiac enzymes remained negative. 2D echo was obtained demonstrating normal EF 55-60%, grade 1 diastolic dysfunction, no RWMA, normal RV function, mildly dilated aortic root of 39mm. His CP was felt atypical, but his DOE was concerning and thus cardiac cath was performed today demonstrating moderate CAD : Left Main: Mild irregularities, no significant stenosis  Left anterior Descending: Mild irregularities, 20-30% stenosis  Left Circumflex: large vessel. Mild irregularities - 20%. Multiple OM branches have minor luminal irregularities  Right Coronary Artery: large and dominant. Proximal stent has minor luminal irregularities. Mid vessel has mild 30-40% stenosis. The stent in the PDA has a focal 40-50% in-stent restenosis. The flow through this stenosis is brisk.  LV Gram: normal LV function. EF 65 % (following Dopamine infusion) The patient was given 100 mcg IC NTG to the RCA - the vessel dilated slightly. Following this the patient had hypotension (65/40) associated with nausea but no LOC. He was given IVF and low dose Dopamine for 10-15 minutes with restoration of a normal BP. He was monitored closely and remained stable. Dr. Elease Hashimoto recommended an outpatient Myoview as there was moderate ISR (40-50%) of the PDA stent, but it did not appear to obstruct flow. OP Myoview will be scheduled after recovery from femoral cath. The patient recovered without complications. He is feeling well  today without CP or SOB. He was instructed to let us know if these symptoms persist. He ambulated without any symptoms or complications. Dr. Elease Hashimoto has seen and examined him and feels he is stable for discharge. He feels the patient can return to work later this week, and also notes that his aortic root dilitation may need to be monitored periodically.   Discharge Vitals: Blood pressure 130/69,  pulse 63, temperature 98.2 F (36.8 C), temperature source Oral, resp. rate 18, height 5\' 7"  (1.702 m), weight 181 lb 3.5 oz (82.2 kg), SpO2 95.00%.  Labs: Lab Results  Component Value Date   WBC 9.9 11/26/2011   HGB 14.8 11/26/2011   HCT 41.7 11/26/2011   MCV 90.1 11/26/2011   PLT 213 11/26/2011     Lab 11/26/11 1355 11/26/11 0520 11/25/11 1330  NA -- 138 --  K -- 4.3 --  CL -- 101 --  CO2 -- 25 --  BUN -- 20 --  CREATININE 0.92 -- --  CALCIUM -- 9.6 --  PROT -- -- 7.5  BILITOT -- -- 0.4  ALKPHOS -- -- 97  ALT -- -- 22  AST -- -- 20  GLUCOSE -- 120* --    Basename 11/26/11 0041 11/25/11 1914 11/25/11 1330  CKTOTAL 56 64 64  CKMB 1.7 1.9 1.9  TROPONINI <0.30 <0.30 <0.30   Lab Results  Component Value Date   CHOL 174 11/26/2011   HDL 38* 11/26/2011   LDLCALC 92 11/26/2011   TRIG 218* 11/26/2011   Lab Results  Component Value Date   DDIMER 0.35 11/25/2011    Diagnostic Studies/Procedures   1. Dg Chest Port 1 View 11/25/2011  *RADIOLOGY REPORT*  Clinical Data: Chest pain.  PORTABLE CHEST - 1 VIEW  Comparison: 12/25/2009.  Findings: Trachea is midline.  Heart size normal.  Linear scarring at the left lung base.  Lungs are otherwise clear.  No pleural fluid.  IMPRESSION: No acute findings.  Original Report Authenticated By: Reyes Ivan, M.D.   2. 2D Echo 11/25/11 Study Conclusions - Left ventricle: The cavity size was normal. Wall thickness was normal. Systolic function was normal. The estimated ejection fraction was in the range of 55% to 60%. Wallmotion was normal; there were no regional wall motion abnormalities. Doppler parameters are consistent with abnormal left ventricular relaxation (grade 1 diastolic dysfunction). - Aortic valve: There was no stenosis. Trivial regurgitation. - Aorta: Mildly dilated aortic root. Aortic root dimension: 39mm (ED). - Mitral valve: No significant regurgitation. - Left atrium: The atrium was mildly dilated. - Right ventricle: The  cavity size was normal. Systolic function was normal. - Pulmonary arteries: No complete TR doppler jet so unable to estimate PA systolic pressure. - Inferior vena cava: The vessel was normal in size; the respirophasic diameter changes were in the normal range (= 50%); findings are consistent with normal central venous pressure. Impressions: - Normal LV size and systolic function, EF 55-60%. Normal RV size and systolic function. No significant valvular abnormalities. Mildly dilated aortic root.  3. Cardiac catheterization this admission, please see full report and above for summary.   Discharge Medications   Medication List  As of 11/26/2011  4:52 PM   TAKE these medications         amLODipine 5 MG tablet   Commonly known as: NORVASC   Take 1 tablet (5 mg total) by mouth daily.      aspirin 81 MG tablet   Take 81 mg by mouth daily.  Co Q-10 100 MG Caps   Take by mouth daily.      fish oil-omega-3 fatty acids 1000 MG capsule   Take 2 g by mouth daily.      lisinopril-hydrochlorothiazide 20-12.5 MG per tablet   Commonly known as: PRINZIDE,ZESTORETIC   Take 1 tablet by mouth daily.      nitroGLYCERIN 0.4 MG SL tablet   Commonly known as: NITROSTAT   Place 1 tablet (0.4 mg total) under the tongue every 5 (five) minutes x 3 doses as needed for chest pain.      pravastatin 40 MG tablet   Commonly known as: PRAVACHOL   Take 1 tablet (40 mg total) by mouth daily.      vitamin C 1000 MG tablet   Take 1,000 mg by mouth daily.            Disposition   The patient will be discharged in stable condition to home. Discharge Orders    Future Appointments: Provider: Department: Dept Phone: Center:   12/09/2011 11:30 AM [facility]  None   12/11/2011 8:00 AM Rosalio Macadamia, NP [facility] [phone] None   01/03/2012 8:00 AM Karie Schwalbe, MD [facility] [phone] [id number]     Future Orders Please Complete By Expires    Diet - low sodium heart healthy      Discharge instructions      Comments:   If you continue to have shortness of breath or chest pain, please call your doctor.   Increase activity slowly      Comments:   No driving for 2 days. No lifting over 5 lbs for 1 week. No sexual activity for 1 week. You may return to work on Friday 11/29/11, but only if you are feeling well (with the above lifting restrictions in place). Please let your doctor know if you are not feeling well. Keep white gauze dressing on overnight. You may remove this dressing gently in the morning and shower, then replace with a clean Bandaid for the next day. No soaking baths/hot tubs/pools/lakes for 1 week. Keep procedure site clean & dry. If you notice increased pain, swelling, bleeding or pus, call/return!     Follow-up Information    Follow up with [facility]. (12/09/11 at 11:30 am for your stress test - see attached instructions. This will help [place] to evaluate if the moderate blockage in your heart artery is impeding blood flow, or if it just needs to be followed.)    Contact information:   [address] [place] [address] [phone]      Follow up with Norma Fredrickson, NP. (You will see Norma Fredrickson, NP on 12/11/11 at 8am)    Contact information:   [facility] [address] [place] [address] [phone]           Duration of Discharge Encounter: Greater than 30 minutes including physician and PA time.  Signed, Ronie Spies PA-C 11/26/2011, 4:52 PM   See my note from earlier today.  Vesta Mixer, Montez Hageman., MD, [facility] 11/27/2011, 10:24 AM Office - [phone] Pager [phone]

## 2011-11-26 NOTE — CV Procedure (Signed)
    Cardiac Cath Note  Malikiah Debarr Baeten [id number] [birth year]  Procedure: left Heart Cardiac Catheterization Note Indications: chest pain, dyspnea  Procedure Details Consent: Obtained Time Out: Verified patient identification, verified procedure, site/side was marked, verified correct patient position, special equipment/implants available, Radiology Safety Procedures followed,  medications/allergies/relevent history reviewed, required imaging and test results available.  Performed   Medications: Fentanyl: 50 mg IV Versed: 2 mg IV  The right femoral artery was easily canulated using a modified Seldinger technique.  Hemodynamics:   LV pressure: 150/34 Aortic pressure: 144/70  Angiography   Left Main:  Mild irregularities, no significant stenosis  Left anterior Descending:  Mild irregularities, 20-30% stenosis  Left Circumflex: large vessel. Mild irregularities - 20%. Multiple OM branches have minor luminal irregularities  Right Coronary Artery: large and dominant.  Proximal stent has minor luminal irregularities.  Mid vessel has mild 30-40% stenosis.  The stent in the PDA has a focal 40-50% in-stent restenosis.  The flow through this stenosis is brisk.  We gave 100 mcg IC NTG.  The vessel dilated slightly.  Following this the patient had hypotension (65/40).   We gave him IVF and low dose Dopamine for 10-15 minutes with restoration of a normal BP.   LV Gram: normal LV function.  EF 65 % (following Dopamine infusion)  Complications: No apparent complications Patient did tolerate procedure well.  Conclusions:   1. Moderate CAD.  There is moderate ISR of the PDA stent but it does not appear to obstruct flow. Cardiac enzymes are negative.    I would suggest an OP myoview and follow up with his primary cardiologist.  He should be able to go home today or tomorrow.  Vesta Mixer, Montez Hageman., MD, [facility] 11/26/2011, 10:48 AM Office - [phone] Pager [phone]

## 2011-11-26 NOTE — Care Management Note (Unsigned)
    Page 1 of 1   11/26/2011     11:56:12 AM   CARE MANAGEMENT NOTE 11/26/2011  Patient:  JAG, LENZ   Account Number:  [id number]  Date Initiated:  11/26/2011  Documentation initiated by:  SIMMONS,Isis Costanza  Subjective/Objective Assessment:   ADMITTED WITH CHEST PAIN; LIVES AT HOME ALONE; IN CATH LAB.     Action/Plan:   DSICHARGE PLANNING.   Anticipated DC Date:  11/27/2011   Anticipated DC Plan:  HOME/SELF CARE      DC Planning Services  CM consult      Choice offered to / List presented to:             Status of service:  In process, will continue to follow Medicare Important Message given?   (If response is "NO", the following Medicare IM given date fields will be blank) Date Medicare IM given:   Date Additional Medicare IM given:    Discharge Disposition:    Per UR Regulation:  Reviewed for med. necessity/level of care/duration of stay  If discussed at Long Length of Stay Meetings, dates discussed:    Comments:  11/26/11  1155  Perel Hauschild SIMMONS RN, BSN [phone] NCM WILL FOLLOW.

## 2011-11-28 ENCOUNTER — Other Ambulatory Visit: Payer: Self-pay | Admitting: *Deleted

## 2011-11-28 MED ORDER — LISINOPRIL-HYDROCHLOROTHIAZIDE 20-12.5 MG PO TABS: 2.0000 | ORAL_TABLET | Freq: Every day | ORAL | Status: DC

## 2011-11-28 NOTE — Telephone Encounter (Signed)
Patient's lisinopril-hctz was sent to the pharmacy incorrectly, was sent qd and should be bid, spoke with [facility] and patient and corrected rx.

## 2011-12-04 ENCOUNTER — Encounter (HOSPITAL_COMMUNITY): Payer: Medicare Other

## 2011-12-09 ENCOUNTER — Ambulatory Visit (HOSPITAL_COMMUNITY): Payer: Medicare Other | Attending: Cardiology | Admitting: Radiology

## 2011-12-09 VITALS — BP 117/73 | Ht 67.0 in | Wt 183.0 lb

## 2011-12-09 DIAGNOSIS — I2510 Atherosclerotic heart disease of native coronary artery without angina pectoris: Secondary | ICD-10-CM

## 2011-12-09 DIAGNOSIS — R079 Chest pain, unspecified: Secondary | ICD-10-CM | POA: Insufficient documentation

## 2011-12-09 DIAGNOSIS — R0789 Other chest pain: Secondary | ICD-10-CM

## 2011-12-09 MED ORDER — TECHNETIUM TC 99M TETROFOSMIN IV KIT
10.0000 | PACK | Freq: Once | INTRAVENOUS | Status: AC | PRN
  Administered 2011-12-09: 10 via INTRAVENOUS

## 2011-12-09 MED ORDER — TECHNETIUM TC 99M TETROFOSMIN IV KIT
30.0000 | PACK | Freq: Once | INTRAVENOUS | Status: AC | PRN
  Administered 2011-12-09: 30 via INTRAVENOUS

## 2011-12-09 NOTE — Progress Notes (Signed)
[facility] SITE 3 NUCLEAR MED [address] [place] [address] [phone]  Cardiology Nuclear Med Study  Patrick [name] is a 71 y.o. male     MRN : [id number]     DOB: 02/20/1941  Procedure Date: 12/09/2011  Nuclear Med Background Indication for Stress Test:  Evaluation for Ischemia, PTCA/Stent Patency and Post Hospital on 11-25-11 for CP/DOE, (-) enzymes> Cath on 11-26-11:Moderate CAD; Assess moderate ISR (40-50%) of PDA stent  History: '06 Stent RCA, PDA, 12-2009 Myocardial Perfusion Study-(-) ischemia (-) scar, EF=57%, 11-25-11 Echo: EF=55-60%,11-26-11 Heart Catheterization-Moderate CAD, 40-50% in stent PDA, did not appear to obstruct flow, EF=65% Cardiac Risk Factors: Family History - CAD, History of Smoking, Hypertension and Lipids  Symptoms: Chest pain and Chest Pressure with/without exertion (last occurrence 11/25/11),  Diaphoresis, DOE and SOB   Nuclear Pre-Procedure Caffeine/Decaff Intake:  None NPO After: 6:30am   Lungs:  clear O2 Sat: 95% on room air. IV 0.9% NS with Angio Cath:  20g  IV Site: R Hand  IV Started by:  Doyne Keel, CNMT  Chest Size (in):  42in Cup Size: n/a  Height: 5\' 7"  (1.702 m)  Weight:  183 lb (83.008 kg)  BMI:  Body mass index is 28.66 kg/(m^2). Tech Comments:  Patient took all meds as directed    Nuclear Med Study 1 or 2 day study: 1 day  Stress Test Type:  Stress  Reading MD: Olga Millers, MD  Order Authorizing Provider:  Katherina Right, MD  Resting Radionuclide: Technetium 71m Tetrofosmin  Resting Radionuclide Dose: 11.0 mCi   Stress Radionuclide:  Technetium 24m Tetrofosmin  Stress Radionuclide Dose: 33.0 mCi           Stress Protocol Rest HR: 50 Stress HR: 133  Rest BP: 117/73 Stress BP: 196/80  Exercise Time (min): 10:45 METS: 12.6   Predicted Max HR: 149 bpm % Max HR: 89.26 bpm Rate Pressure Product: 21308   Dose of Adenosine (mg):  n/a Dose of Lexiscan: n/a mg  Dose of Atropine (mg): n/a Dose of Dobutamine: n/a  mcg/kg/min (at max HR)  Stress Test Technologist: Irean Hong, RN  Nuclear Technologist:  Domenic Polite, CNMT     Rest Procedure:  Myocardial perfusion imaging was performed at rest 45 minutes following the intravenous administration of Technetium 19m Tetrofosmin. Rest ECG: Sinus bradycardia  Stress Procedure:  The patient performed treadmill exercise using a Bruce  Protocol for 10 minutes and 45 seconds, RPE=17 minutes. The patient stopped due to DOE and denied any chest pain.  There were nonspecific ST-T wave changes. There were occasional PAC's.  Technetium 37m Tetrofosmin was injected at peak exercise and myocardial perfusion imaging was performed after a brief delay. Stress ECG: No significant ST segment change suggestive of ischemia.  QPS Raw Data Images:  Acquisition technically good; normal left ventricular size. Stress Images:  Normal homogeneous uptake in all areas of the myocardium. Rest Images:  Normal homogeneous uptake in all areas of the myocardium. Subtraction (SDS):  No evidence of ischemia. Transient Ischemic Dilatation (Normal <1.22):  0.96 Lung/Heart Ratio (Normal <0.45):  0.33  Quantitative Gated Spect Images QGS EDV:  107 ml QGS ESV:  45 ml  Impression Exercise Capacity:  Good exercise capacity. BP Response:  Normal blood pressure response. Clinical Symptoms:  There is dyspnea. ECG Impression:  No significant ST segment change suggestive of ischemia. Comparison with Prior Nuclear Study: No previous nuclear study performed  Overall Impression:  Normal stress nuclear study.  LV Ejection  Fraction: 59%.  LV Wall Motion:  NL LV Function; NL Wall Motion   Olga Millers

## 2011-12-10 ENCOUNTER — Telehealth: Payer: Self-pay | Admitting: Cardiovascular Disease

## 2011-12-10 NOTE — Telephone Encounter (Signed)
Close  

## 2011-12-11 ENCOUNTER — Encounter: Payer: Self-pay | Admitting: Nurse Practitioner

## 2011-12-11 ENCOUNTER — Ambulatory Visit (INDEPENDENT_AMBULATORY_CARE_PROVIDER_SITE_OTHER): Payer: Medicare Other | Admitting: Nurse Practitioner

## 2011-12-11 VITALS — BP 135/80 | HR 60 | Resp 20 | Ht 67.0 in | Wt 188.2 lb

## 2011-12-11 DIAGNOSIS — I10 Essential (primary) hypertension: Secondary | ICD-10-CM

## 2011-12-11 DIAGNOSIS — I77810 Thoracic aortic ectasia: Secondary | ICD-10-CM | POA: Insufficient documentation

## 2011-12-11 DIAGNOSIS — I2510 Atherosclerotic heart disease of native coronary artery without angina pectoris: Secondary | ICD-10-CM

## 2011-12-11 DIAGNOSIS — E785 Hyperlipidemia, unspecified: Secondary | ICD-10-CM

## 2011-12-11 MED ORDER — LOSARTAN POTASSIUM-HCTZ 100-25 MG PO TABS: 1.0000 | ORAL_TABLET | Freq: Every day | ORAL | Status: DC

## 2011-12-11 NOTE — Assessment & Plan Note (Signed)
Noted on recent echo. 39 mm. Will need following.

## 2011-12-11 NOTE — Assessment & Plan Note (Addendum)
BP is ok. I have asked him to monitor at home. No change in his medicines but he does report possible ACE cough. I switched him to Hyzaar 100-25 daily. He will try this for a month. If he sees no change, he can resume his Lisinopril HCT. We will see him back in 4 months.

## 2011-12-11 NOTE — Patient Instructions (Addendum)
I think you are doing well.  Your stress test looks good.   Stay on your current medicines.  Stop the Lisinopril Hct  Start Hyzaar 100-25 daily.   Exercise, stay active and work on your diet and weight  We will see you in 4 months.  We will need to monitor your aorta periodically   Call the [facility] office at [phone] if you have any questions, problems or concerns.

## 2011-12-11 NOTE — Assessment & Plan Note (Signed)
We discussed dietary measures to help lower his values.

## 2011-12-11 NOTE — Progress Notes (Signed)
Patrick [name] Date of Birth: 08-31-1940 Medical Record #[id number]  History of Present Illness: Patrick [name] is seen today for a post hospital visit. He is seen for Dr. Elease Hashimoto. He is a former patient of Dr. Prescott Gum. I actually took care of his father for many years who has since passed away. He has known CAD with prior DES to the RCA and PDA in 2006. Has had recent cath showing 40 to 50% in stent restenosis of the PDA stent but it did not appear to obstruct flow. Outpatient Myoview was ordered and this was completed yesterday. It is read as normal with no ischemia and normal EF. Other issues include mildly dilated aortic root which will need to be followed, HTN, HLD and sinus bradycardia. He is not on beta blocker. He also has GERD, colon polyps, diverticulitis, BPH, sleep apnea, impaired glucose and prior pneumonia and syncope.   He comes in today. He is here alone. He is doing well. No more chest pain. He is back to all of his usual activities and feels ok. Does have a dry cough that has been persistent. He is on ACE. BP at home has been ok. He does not check it as frequently as he probably should. Does have nightly alcohol use. Groin has done fine.   Current Outpatient Prescriptions on File Prior to Visit  Medication Sig Dispense Refill  . amLODipine (NORVASC) 5 MG tablet Take 1 tablet (5 mg total) by mouth daily.  90 tablet  3  . Ascorbic Acid (VITAMIN C) 1000 MG tablet Take 1,000 mg by mouth daily.        Marland Kitchen aspirin 81 MG tablet Take 81 mg by mouth daily.        . Coenzyme Q10 (CO Q-10) 100 MG CAPS Take by mouth daily.        . fish oil-omega-3 fatty acids 1000 MG capsule Take 2 g by mouth daily.        . pravastatin (PRAVACHOL) 40 MG tablet Take 1 tablet (40 mg total) by mouth daily.  90 tablet  3  . losartan-hydrochlorothiazide (HYZAAR) 100-25 MG per tablet Take 1 tablet by mouth daily.  30 tablet  6  . nitroGLYCERIN (NITROSTAT) 0.4 MG SL tablet Place 1 tablet (0.4 mg total) under the  tongue every 5 (five) minutes x 3 doses as needed for chest pain.  25 tablet  4    Allergies  Allergen Reactions  . Atorvastatin     REACTION: leg cramps  . Demerol (Meperidine)     No relief reported  . Ezetimibe-Simvastatin     REACTION: cramps    Past Medical History  Diagnosis Date  . CAD (coronary artery disease)     a. s/p Taxus DES to RCA and PDA in 2006 .b. Myoview 8/11: Walked 12:37. EF 57%. no ischemia or scar. c. Mod CAD by cath 11/2011 (40-50% ISR of PDA -- for OP myoview to assess).  . Hypertension   . Hyperlipidemia   . Allergy   . Bradycardia     Precluding BB use.  Marland Kitchen GERD (gastroesophageal reflux disease)   . Colon polyps   . Diverticulitis   . BPH (benign prostatic hypertrophy)   . Sleep apnea   . Impaired fasting glucose   . Pneumonia   . Syncope   . Aortic root dilatation     Mild by echo 11/2011 (39mm)    Past Surgical History  Procedure Date  . Angioplasty     percutaneous  transluminal coronary  angioplasty and stenting of the right coronary artery and posterior   . Pilonidal cyst / sinus excision     History  Smoking status  . Former Smoker  . Quit date: 05/13/1978  Smokeless tobacco  . Never Used    History  Alcohol Use  . Yes    wine nightly (2-3 glasses) if not working    Family History  Problem Relation Age of Onset  . Coronary artery disease Father   . Hypertension Neg Hx   . Diabetes Neg Hx     Review of Systems: The review of systems is per the HPI.  All other systems were reviewed and are negative.  Physical Exam: BP 135/80  Pulse 60  Resp 20  Ht 5\' 7"  (1.702 m)  Wt 188 lb 3.2 oz (85.367 kg)  BMI 29.48 kg/m2 Patient is very pleasant and in no acute distress. Skin is warm and dry. Color is normal.  HEENT is unremarkable. Normocephalic/atraumatic. PERRL. Sclera are nonicteric. Neck is supple. No masses. No JVD. Lungs are clear. Cardiac exam shows a regular rate and rhythm. Abdomen is soft. Extremities are without edema.  Gait and ROM are intact. No gross neurologic deficits noted.   LABORATORY DATA: Myoview Impression from December 09, 2011  Exercise Capacity: Good exercise capacity.  BP Response: Normal blood pressure response.  Clinical Symptoms: There is dyspnea.  ECG Impression: No significant ST segment change suggestive of ischemia.  Comparison with Prior Nuclear Study: No previous nuclear study performed   Overall Impression: Normal stress nuclear study.  LV Ejection Fraction: 59%. LV Wall Motion: NL LV Function; NL Wall Motion    Lab Results  Component Value Date   WBC 9.9 11/26/2011   HGB 14.8 11/26/2011   HCT 41.7 11/26/2011   PLT 213 11/26/2011   GLUCOSE 120* 11/26/2011   CHOL 174 11/26/2011   TRIG 218* 11/26/2011   HDL 38* 11/26/2011   LDLDIRECT 125.7 07/01/2011   LDLCALC 92 11/26/2011   ALT 22 11/25/2011   AST 20 11/25/2011   NA 138 11/26/2011   K 4.3 11/26/2011   CL 101 11/26/2011   CREATININE 0.92 11/26/2011   BUN 20 11/26/2011   CO2 25 11/26/2011   TSH 0.984 11/25/2011   PSA 0.73 12/08/2009   INR 1.02 11/25/2011   HGBA1C 5.9 12/08/2009     Assessment / Plan:

## 2011-12-11 NOTE — Assessment & Plan Note (Addendum)
He has had recent cath. Does have in stent restenosis in the PD but it is at 40 to 50%. Myoview is normal. Will continue to manage him medically and encourage risk factor modification. Dr. Elease Hashimoto will see him back in 4 months. Patient is agreeable to this plan and will call if any problems develop in the interim.

## 2012-01-03 ENCOUNTER — Encounter: Payer: Self-pay | Admitting: Internal Medicine

## 2012-01-03 ENCOUNTER — Ambulatory Visit (INDEPENDENT_AMBULATORY_CARE_PROVIDER_SITE_OTHER): Payer: Medicare Other | Admitting: Internal Medicine

## 2012-01-03 VITALS — BP 138/70 | HR 65 | Temp 98.0°F | Ht 67.0 in | Wt 186.0 lb

## 2012-01-03 DIAGNOSIS — E785 Hyperlipidemia, unspecified: Secondary | ICD-10-CM

## 2012-01-03 DIAGNOSIS — I10 Essential (primary) hypertension: Secondary | ICD-10-CM

## 2012-01-03 DIAGNOSIS — N400 Enlarged prostate without lower urinary tract symptoms: Secondary | ICD-10-CM

## 2012-01-03 DIAGNOSIS — I2510 Atherosclerotic heart disease of native coronary artery without angina pectoris: Secondary | ICD-10-CM

## 2012-01-03 NOTE — Assessment & Plan Note (Signed)
BP Readings from Last 3 Encounters:  01/03/12 138/70  12/11/11 135/80  12/09/11 117/73   Good control No changes needed

## 2012-01-03 NOTE — Assessment & Plan Note (Signed)
Lab Results  Component Value Date   LDLCALC 92 11/26/2011   Doesn't seem to have any problems with the med

## 2012-01-03 NOTE — Assessment & Plan Note (Signed)
Mild symptoms only No Rx needed 

## 2012-01-03 NOTE — Progress Notes (Signed)
Subjective:    Patient ID: Patrick [name], male    DOB: 1940/10/14, 71 y.o.   MRN: [id number]  HPI Doing well Recent cath and stress test. 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 Gets cramps if he doesn't take it  Voids okay Still has some urgency Nocturia x 1  No chest pain No SOB Exercise tolerance is fine. Did have some DOE before cardiologist---now this seems better  Current Outpatient Prescriptions on File Prior to Visit  Medication Sig Dispense Refill  . amLODipine (NORVASC) 5 MG tablet Take 1 tablet (5 mg total) by mouth daily.  90 tablet  3  . Ascorbic Acid (VITAMIN C) 1000 MG tablet Take 1,000 mg by mouth daily.        Marland Kitchen aspirin 81 MG tablet Take 81 mg by mouth daily.        . Coenzyme Q10 (CO Q-10) 100 MG CAPS Take by mouth daily.        . fish oil-omega-3 fatty acids 1000 MG capsule Take 2 g by mouth daily.        Marland Kitchen losartan-hydrochlorothiazide (HYZAAR) 100-25 MG per tablet Take 1 tablet by mouth daily.  30 tablet  6  . nitroGLYCERIN (NITROSTAT) 0.4 MG SL tablet Place 1 tablet (0.4 mg total) under the tongue every 5 (five) minutes x 3 doses as needed for chest pain.  25 tablet  4  . pravastatin (PRAVACHOL) 40 MG tablet Take 1 tablet (40 mg total) by mouth daily.  90 tablet  3    Allergies  Allergen Reactions  . Atorvastatin     REACTION: leg cramps  . Demerol (Meperidine)     No relief reported  . Ezetimibe-Simvastatin     REACTION: cramps    Past Medical History  Diagnosis Date  . CAD (coronary artery disease)     a. s/p Taxus DES to RCA and PDA in 2006 .b. Myoview 8/11: Walked 12:37. EF 57%. no ischemia or scar. c. Mod CAD by cath 11/2011 (40-50% ISR of PDA -- for OP myoview to assess).  . Hypertension   . Hyperlipidemia   . Allergy   . Bradycardia     Precluding BB use.  Marland Kitchen GERD  (gastroesophageal reflux disease)   . Colon polyps   . Diverticulitis   . BPH (benign prostatic hypertrophy)   . Sleep apnea   . Impaired fasting glucose   . Pneumonia   . Syncope   . Aortic root dilatation     Mild by echo 11/2011 (39mm)    Past Surgical History  Procedure Date  . Angioplasty     percutaneous transluminal coronary  angioplasty and stenting of the right coronary artery and posterior   . Pilonidal cyst / sinus excision     Family History  Problem Relation Age of Onset  . Coronary artery disease Father   . Heart disease Father   . Heart failure Father   . Hypertension Neg Hx   . Diabetes Neg Hx     History   Social History  . Marital Status: Single    Spouse Name: N/A    Number of Children: 3  . Years of Education: N/A   Occupational History  . driver part time    Social History Main Topics  . Smoking status: Former  Smoker    Quit date: 05/13/1978  . Smokeless tobacco: Never Used  . Alcohol Use: Yes     wine nightly (2-3 glasses) if not working  . Drug Use: No  . Sexually Active: Yes   Other Topics Concern  . Not on file   Social History Narrative  . No narrative on file   Review of Systems Appetite is fine Weight is stable Sleeps well    Objective:   Physical Exam  Constitutional: He appears well-developed and well-nourished. No distress.  Neck: Normal range of motion. Neck supple. No thyromegaly present.  Cardiovascular: Normal rate, regular rhythm, normal heart sounds and intact distal pulses.  Exam reveals no gallop.   No murmur heard. Pulmonary/Chest: Effort normal and breath sounds normal. No respiratory distress. He has no wheezes. He has no rales.  Abdominal: Soft. There is no tenderness.  Musculoskeletal: He exhibits no edema and no tenderness.  Lymphadenopathy:    He has no cervical adenopathy.  Psychiatric: He has a normal mood and affect. His behavior is normal. Thought content normal.          Assessment & Plan:

## 2012-01-03 NOTE — Assessment & Plan Note (Signed)
Recent reassuring stress test Follows with cardiology

## 2012-04-21 ENCOUNTER — Encounter: Payer: Self-pay | Admitting: Cardiovascular Disease

## 2012-04-21 ENCOUNTER — Ambulatory Visit (INDEPENDENT_AMBULATORY_CARE_PROVIDER_SITE_OTHER): Payer: Medicare Other | Admitting: Cardiovascular Disease

## 2012-04-21 VITALS — BP 140/74 | HR 63 | Ht 67.0 in | Wt 193.1 lb

## 2012-04-21 DIAGNOSIS — E785 Hyperlipidemia, unspecified: Secondary | ICD-10-CM

## 2012-04-21 DIAGNOSIS — I2510 Atherosclerotic heart disease of native coronary artery without angina pectoris: Secondary | ICD-10-CM

## 2012-04-21 NOTE — Progress Notes (Signed)
Sheryn Bison Speak Date of Birth  [birth year]       [facility]    [place] [address]  [address], [address] [place], [place]  [address]   [place], [place]  [address] [phone]     [phone]   Fax  [phone]    Fax [phone]  Problem List: 1. Coronary artery disease-moderate.  He's had a previous stent placed. 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 A subsequent Myoview study was normal. 2. Hypertension 3. Hyperlipidemia 4. Sleep apnea   History of Present Illness:  Mr. Gehring is doing very will.   He has been on a mission trip and to [employer].  No further episodes of chest pain.   He hauled 5 loads of hogs last week - his brother raises the hogs.  He is planning on going to [place] over Christmas.  He still does his lawn care business.  [place] the [employer] - 3 miles a day.  Without symptoms.  Current Outpatient Prescriptions on File Prior to Visit  Medication Sig Dispense Refill  . amLODipine (NORVASC) 5 MG tablet Take 1 tablet (5 mg total) by mouth daily.  90 tablet  3  . Ascorbic Acid (VITAMIN C) 1000 MG tablet Take 1,000 mg by mouth daily.        Marland Kitchen aspirin 81 MG tablet Take 81 mg by mouth daily.        . Coenzyme Q10 (CO Q-10) 100 MG CAPS Take by mouth daily.        . fish oil-omega-3 fatty acids 1000 MG capsule Take 2 g by mouth daily.        Marland Kitchen losartan-hydrochlorothiazide (HYZAAR) 100-25 MG per tablet Take 1 tablet by mouth daily.  30 tablet  6  . nitroGLYCERIN (NITROSTAT) 0.4 MG SL tablet Place 1 tablet (0.4 mg total) under the tongue every 5 (five) minutes x 3 doses as needed for chest pain.  25 tablet  4  . pravastatin (PRAVACHOL) 40 MG tablet Take 1 tablet (40 mg total) by mouth daily.  90 tablet  3    Allergies  Allergen Reactions  . Atorvastatin     REACTION: leg cramps  . Demerol (Meperidine)     No relief reported  . Ezetimibe-Simvastatin     REACTION: cramps     Past Medical History  Diagnosis Date  . CAD (coronary artery disease)     a. s/p Taxus DES to RCA and PDA in 2006 .b. Myoview 8/11: Walked 12:37. EF 57%. no ischemia or scar. c. Mod CAD by cath 11/2011 (40-50% ISR of PDA -- for OP myoview to assess).  . Hypertension   . Hyperlipidemia   . Allergy   . Bradycardia     Precluding BB use.  Marland Kitchen GERD (gastroesophageal reflux disease)   . Colon polyps   . Diverticulitis   . BPH (benign prostatic hypertrophy)   . Sleep apnea   . Impaired fasting glucose   . Pneumonia   . Syncope   . Aortic root dilatation     Mild by echo 11/2011 (39mm)    Past Surgical History  Procedure Date  . Angioplasty     percutaneous transluminal coronary  angioplasty and stenting of the right coronary artery and posterior   . Pilonidal cyst / sinus excision     History  Smoking status  . Former Smoker  . Quit date: 05/13/1978  Smokeless tobacco  .  Never Used    History  Alcohol Use  . Yes    Comment: wine nightly (2-3 glasses) if not working    Family History  Problem Relation Age of Onset  . Coronary artery disease Father   . Heart disease Father   . Heart failure Father   . Hypertension Neg Hx   . Diabetes Neg Hx     Reviw of Systems:  Reviewed in the HPI.  All other systems are negative.  Physical Exam: Blood pressure 140/74, pulse 63, height 5\' 7"  (1.702 m), weight 193 lb 1.9 oz (87.599 kg), SpO2 95.00%. General: Well developed, well nourished, in no acute distress.  Head: Normocephalic, atraumatic, sclera non-icteric, mucus membranes are moist,   Neck: Supple. Carotids are 2 + without bruits. No JVD   Lungs: Clear   Heart: RR, normal S1 ,S2  Abdomen: Soft, non-tender, non-distended with normal bowel sounds.  Msk:  Strength and tone are normal   Extremities: No clubbing or cyanosis. No edema.  Distal pedal pulses are 2+ and equal    Neuro: CN II - XII intact.  Alert and oriented X 3.   Psych:  Normal    ECG:  Assessment / Plan:

## 2012-04-21 NOTE — Assessment & Plan Note (Signed)
Patrick [name] is doing very well. He remains very active. Has not had any episodes of chest pain or shortness of breath. He had a heart catheterization which revealed moderate coronary artery disease. He is basically asymptomatic.  His triglyceride levels remain mildly elevated. I have cautioned him about eating anything white, wheat, or sweat. I have asked him to continue with her exercise program.  We'll have him return in a week or so to check fasting lipid profile. I'll see him again in 6 months for an office visit and fasting labs.

## 2012-04-21 NOTE — Patient Instructions (Addendum)
Your physician recommends that you return for a FASTING lipid profile: TOMORROW 7;30 -4 PM  Your physician wants you to follow-up in: 6 MONTHS  You will receive a reminder letter in the mail two months in advance. If you don't receive a letter, please call our office to schedule the follow-up appointment.

## 2012-04-22 ENCOUNTER — Other Ambulatory Visit (INDEPENDENT_AMBULATORY_CARE_PROVIDER_SITE_OTHER): Payer: Medicare Other

## 2012-04-22 DIAGNOSIS — E785 Hyperlipidemia, unspecified: Secondary | ICD-10-CM

## 2012-04-22 LAB — HEPATIC FUNCTION PANEL
AST: 24 U/L (ref 0–37)
Alkaline Phosphatase: 84 U/L (ref 39–117)
Bilirubin, Direct: 0 mg/dL (ref 0.0–0.3)
Total Protein: 7.4 g/dL (ref 6.0–8.3)

## 2012-04-22 LAB — BASIC METABOLIC PANEL
BUN: 23 mg/dL (ref 6–23)
Calcium: 8.9 mg/dL (ref 8.4–10.5)
Chloride: 101 mEq/L (ref 96–112)
Creatinine, Ser: 1.1 mg/dL (ref 0.4–1.5)

## 2012-04-22 LAB — LIPID PANEL
Cholesterol: 159 mg/dL (ref 0–200)
LDL Cholesterol: 96 mg/dL (ref 0–99)

## 2012-04-24 ENCOUNTER — Telehealth: Payer: Self-pay | Admitting: Cardiovascular Disease

## 2012-04-24 NOTE — Telephone Encounter (Signed)
New Problem:    Patient called in wanting to know the results of his latest lab draws.  Please call back.

## 2012-04-24 NOTE — Telephone Encounter (Signed)
RESULTS GIVEN

## 2012-05-22 ENCOUNTER — Ambulatory Visit (INDEPENDENT_AMBULATORY_CARE_PROVIDER_SITE_OTHER): Payer: Medicare Other | Admitting: Internal Medicine

## 2012-05-22 ENCOUNTER — Encounter: Payer: Self-pay | Admitting: Internal Medicine

## 2012-05-22 VITALS — BP 130/70 | HR 68 | Temp 97.9°F | Ht 67.0 in | Wt 197.8 lb

## 2012-05-22 DIAGNOSIS — E785 Hyperlipidemia, unspecified: Secondary | ICD-10-CM

## 2012-05-22 DIAGNOSIS — Z1331 Encounter for screening for depression: Secondary | ICD-10-CM

## 2012-05-22 DIAGNOSIS — I2510 Atherosclerotic heart disease of native coronary artery without angina pectoris: Secondary | ICD-10-CM

## 2012-05-22 DIAGNOSIS — N400 Enlarged prostate without lower urinary tract symptoms: Secondary | ICD-10-CM

## 2012-05-22 DIAGNOSIS — I10 Essential (primary) hypertension: Secondary | ICD-10-CM

## 2012-05-22 DIAGNOSIS — Z0000 Encounter for general adult medical examination without abnormal findings: Secondary | ICD-10-CM

## 2012-05-22 DIAGNOSIS — Z029 Encounter for administrative examinations, unspecified: Secondary | ICD-10-CM

## 2012-05-22 LAB — POCT URINALYSIS DIPSTICK
Bilirubin, UA: NEGATIVE
Blood, UA: NEGATIVE
Glucose, UA: NEGATIVE
Spec Grav, UA: 1.015
Urobilinogen, UA: NEGATIVE
pH, UA: 6.5

## 2012-05-22 NOTE — Assessment & Plan Note (Signed)
BP Readings from Last 3 Encounters:  05/22/12 130/70  04/21/12 140/74  01/03/12 138/70   Good control No changes needed

## 2012-05-22 NOTE — Assessment & Plan Note (Signed)
Symptoms still aren't bad enough for him to start meds

## 2012-05-22 NOTE — Assessment & Plan Note (Signed)
Lab Results  Component Value Date   LDLCALC 96 04/22/2012   No problems as long as he takes the coQ-10

## 2012-05-22 NOTE — Assessment & Plan Note (Signed)
Doing well No PSA due to age UTD on imms and colon Needs to work on fitness

## 2012-05-22 NOTE — Assessment & Plan Note (Signed)
Keeps up with Dr Elease Hashimoto Recent stress test reassuring On appropriate Rx

## 2012-05-22 NOTE — Progress Notes (Signed)
Subjective:    Patient ID: Patrick [name], male    DOB: 1941-01-28, 72 y.o.   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 amLODipine (NORVASC) 5 MG tablet Take 1 tablet (5 mg total) by mouth daily.  90 tablet  3  . Ascorbic Acid (VITAMIN C) 1000 MG tablet Take 1,000 mg by mouth daily.        Marland Kitchen aspirin 81 MG tablet Take 81 mg by mouth daily.        . Coenzyme Q10 (CO Q-10) 100 MG CAPS Take by mouth daily.        . fish oil-omega-3 fatty acids 1000 MG capsule Take 2 g by mouth daily.        Marland Kitchen losartan-hydrochlorothiazide (HYZAAR) 100-25 MG per tablet Take 1 tablet by mouth daily.  30 tablet  6  . nitroGLYCERIN (NITROSTAT) 0.4 MG SL tablet Place 1 tablet (0.4 mg total) under the tongue every 5 (five) minutes x 3 doses as needed for chest pain.  25 tablet  4  . pravastatin (PRAVACHOL) 40 MG tablet Take 1 tablet (40 mg total) by mouth daily.  90 tablet  3    Allergies  Allergen Reactions  . Lisinopril     cough  . Atorvastatin     REACTION: leg cramps  . Demerol (Meperidine)     No relief reported  . Ezetimibe-Simvastatin     REACTION: cramps    Past Medical History  Diagnosis Date  . CAD (coronary artery disease)     a. s/p Taxus DES to RCA and PDA in 2006 .b. Myoview 8/11: Walked 12:37. EF 57%. no ischemia or scar. c. Mod CAD by cath 11/2011 (40-50% ISR of PDA -- for OP myoview to assess).  . Hypertension   . Hyperlipidemia   . Allergy   . Bradycardia     Precluding BB use.  Marland Kitchen GERD (gastroesophageal reflux disease)   . Colon polyps   . Diverticulitis   . BPH (benign prostatic hypertrophy)   . Sleep apnea   . Impaired fasting  glucose   . Pneumonia   . Syncope   . Aortic root dilatation     Mild by echo 11/2011 (39mm)    Past Surgical History  Procedure Date  . Angioplasty     percutaneous transluminal coronary  angioplasty and stenting of the right coronary artery and posterior   . Pilonidal cyst / sinus excision     Family History  Problem Relation Age of Onset  . Coronary artery disease Father   . Heart disease Father   . Heart failure Father   . Hypertension Neg Hx   . Diabetes Neg Hx     History   Social History  . Marital Status: Single    Spouse Name: N/A    Number of Children: 3  . Years of Education: N/A   Occupational History  . driver part time    Social History Main Topics  . Smoking status: Former Smoker    Quit date: 05/13/1978  . Smokeless tobacco: Never Used  . Alcohol Use: Yes     Comment: wine nightly (2-3 glasses)  if not working  . Drug Use: No  . Sexually Active: Yes   Other Topics Concern  . Not on file   Social History Narrative  . No narrative on file   Review of Systems  Constitutional: Positive for unexpected weight change. Negative for fatigue.       Has gained 10# recently---needs to watch diet Plans to join the [employer] Wears seat belt  HENT: Positive for congestion, rhinorrhea and tinnitus. Negative for hearing loss and dental problem.        Loratadine controls allergy symptoms Regular with dentist  Eyes: Negative for visual disturbance.       No diplopia or unilateral vision loss Ongoing monitoring for a cataract  Respiratory: Negative for cough, chest tightness and shortness of breath.        Cough better after change to losartan  Cardiovascular: Negative for chest pain, palpitations and leg swelling.  Gastrointestinal: Negative for nausea, vomiting, abdominal pain, constipation and blood in stool.       Heartburn is gone--no meds lately  Genitourinary: Positive for urgency and frequency. Negative for difficulty urinating.       Nocturia x 3--not  ready for meds No sex--no problem  Musculoskeletal: Positive for back pain. Negative for joint swelling and arthralgias.       Lumbar disc problems as well  Skin: Negative for rash.       No suspicious lesions  Neurological: Positive for numbness. Negative for dizziness, syncope, weakness, light-headedness and headaches.       Some numbness in right arm---related to cervical disc problems  Hematological: Negative for adenopathy. Bruises/bleeds easily.  Psychiatric/Behavioral: Negative for sleep disturbance and dysphoric mood. The patient is not nervous/anxious.        Objective:   Physical Exam  Constitutional: He is oriented to person, place, and time. He appears well-developed and well-nourished. No distress.  HENT:  Head: Normocephalic and atraumatic.  Right Ear: External ear normal.  Left Ear: External ear normal.  Mouth/Throat: Oropharynx is clear and moist. No oropharyngeal exudate.  Eyes: Conjunctivae normal and EOM are normal. Pupils are equal, round, and reactive to light.  Neck: Normal range of motion. Neck supple. No thyromegaly present.  Cardiovascular: Normal rate, regular rhythm, normal heart sounds and intact distal pulses.  Exam reveals no gallop.   No murmur heard. Pulmonary/Chest: Effort normal and breath sounds normal. No respiratory distress. He has no wheezes. He has no rales.  Abdominal: Soft. There is no tenderness.  Musculoskeletal: He exhibits no edema and no tenderness.  Lymphadenopathy:    He has no cervical adenopathy.  Neurological: He is alert and oriented to person, place, and time.  Skin: No rash noted. No erythema.  Psychiatric: He has a normal mood and affect. His behavior is normal.          Assessment & Plan:

## 2012-06-29 ENCOUNTER — Telehealth: Payer: Self-pay | Admitting: Cardiovascular Disease

## 2012-06-29 NOTE — Telephone Encounter (Signed)
Returned call to hospital biller/ pt was seen in ED, she will look further into claim and call if needed.

## 2012-06-29 NOTE — Telephone Encounter (Signed)
New problem     Letter provider support why services was render . Date 11/25/2011 . Cpt [id number].

## 2012-07-21 ENCOUNTER — Encounter: Payer: Medicare Other | Admitting: Internal Medicine

## 2012-07-28 ENCOUNTER — Encounter: Payer: Medicare Other | Admitting: Internal Medicine

## 2012-09-14 ENCOUNTER — Other Ambulatory Visit: Payer: Self-pay | Admitting: *Deleted

## 2012-09-14 MED ORDER — PRAVASTATIN SODIUM 40 MG PO TABS: 40.0000 mg | ORAL_TABLET | Freq: Every day | ORAL | Status: DC

## 2012-09-14 MED ORDER — AMLODIPINE BESYLATE 5 MG PO TABS: 5.0000 mg | ORAL_TABLET | Freq: Every day | ORAL | Status: DC

## 2012-10-29 ENCOUNTER — Other Ambulatory Visit: Payer: Self-pay

## 2012-10-29 MED ORDER — LOSARTAN POTASSIUM-HCTZ 100-25 MG PO TABS: 1.0000 | ORAL_TABLET | Freq: Every day | ORAL | Status: DC

## 2012-11-09 ENCOUNTER — Encounter: Payer: Self-pay | Admitting: Cardiovascular Disease

## 2012-11-09 ENCOUNTER — Ambulatory Visit (INDEPENDENT_AMBULATORY_CARE_PROVIDER_SITE_OTHER): Payer: Medicare Other | Admitting: Cardiovascular Disease

## 2012-11-09 VITALS — BP 142/78 | HR 54 | Ht 67.0 in | Wt 187.8 lb

## 2012-11-09 DIAGNOSIS — E785 Hyperlipidemia, unspecified: Secondary | ICD-10-CM

## 2012-11-09 DIAGNOSIS — I2510 Atherosclerotic heart disease of native coronary artery without angina pectoris: Secondary | ICD-10-CM

## 2012-11-09 LAB — BASIC METABOLIC PANEL
BUN: 18 mg/dL (ref 6–23)
Chloride: 104 mEq/L (ref 96–112)
Glucose, Bld: 106 mg/dL — ABNORMAL HIGH (ref 70–99)
Potassium: 3.8 mEq/L (ref 3.5–5.1)

## 2012-11-09 LAB — HEPATIC FUNCTION PANEL
Bilirubin, Direct: 0.1 mg/dL (ref 0.0–0.3)
Total Bilirubin: 0.6 mg/dL (ref 0.3–1.2)

## 2012-11-09 LAB — LIPID PANEL
LDL Cholesterol: 105 mg/dL — ABNORMAL HIGH (ref 0–99)
Total CHOL/HDL Ratio: 4
VLDL: 14.6 mg/dL (ref 0.0–40.0)

## 2012-11-09 MED ORDER — NITROGLYCERIN 0.4 MG SL SUBL: 0.4000 mg | SUBLINGUAL_TABLET | SUBLINGUAL | Status: DC | PRN

## 2012-11-09 MED ORDER — LOSARTAN POTASSIUM-HCTZ 100-25 MG PO TABS: 1.0000 | ORAL_TABLET | Freq: Every day | ORAL | Status: DC

## 2012-11-09 NOTE — Patient Instructions (Addendum)
Your physician recommends that you return for lab work in: today   Your physician wants you to follow-up in: 6 months You will receive a reminder letter in the mail two months in advance. If you don't receive a letter, please call our office to schedule the follow-up appointment.  Your physician recommends that you return for a FASTING lipid profile: 6 months

## 2012-11-09 NOTE — Assessment & Plan Note (Signed)
Patrick [name] is doing well.  No angina.  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  Continue current meds.

## 2012-11-09 NOTE — Assessment & Plan Note (Signed)
Last lipid levels were drawn in December. His LDL is still slightly high-96. His HDL is also a little low. We'll measure fasting lipid profile today. We may need to try him on Crestor instead of Pravachol. He is intolerant to atorvastatin.

## 2012-11-09 NOTE — Progress Notes (Signed)
Patrick [name] Date of Birth  [birth year]       [facility]    [place] [address]  [address] [place], [place]  [address]   [place], [place]  [address] [phone]     [phone]   Fax  [phone]    Fax [phone]  Problem List: 1. Coronary artery disease-moderate.  He's had a previous stent placed. 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 A subsequent Myoview study was normal. 2. Hypertension 3. Hyperlipidemia 4. Sleep apnea   History of Present Illness:  Patrick [name] is doing very will.   He has been on a mission trip and to [employer].  No further episodes of chest pain.   He hauled 5 loads of hogs last week - his brother raises the hogs.  He is planning on going to [place] over Christmas.  He still does his lawn care business.  [place] the [employer] - 3 miles a day.  Without symptoms.  November 09, 2012:  Patrick [name] is doing well.  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   He is a retired [employer] architect.   Still walking 3 days a week.     Current Outpatient Prescriptions on File Prior to Visit  Medication Sig Dispense Refill  . amLODipine (NORVASC) 5 MG tablet Take 1 tablet (5 mg total) by mouth daily.  90 tablet  1  . Ascorbic Acid (VITAMIN C) 1000 MG tablet Take 1,000 mg by mouth daily.        Marland Kitchen aspirin 81 MG tablet Take 81 mg by mouth daily.        . Coenzyme Q10 (CO Q-10) 100 MG CAPS Take by mouth daily.        . fish oil-omega-3 fatty acids 1000 MG capsule Take 2 g by mouth daily.        Marland Kitchen loratadine (CLARITIN) 10 MG tablet Take 10 mg by mouth daily.      Marland Kitchen losartan-hydrochlorothiazide (HYZAAR) 100-25 MG per tablet Take 1 tablet by mouth daily.  30 tablet  6  . nitroGLYCERIN (NITROSTAT) 0.4 MG SL tablet Place 1 tablet (0.4 mg total) under the tongue every 5 (five) minutes x 3 doses as needed for chest pain.  25 tablet  4  . pravastatin (PRAVACHOL) 40 MG tablet  Take 1 tablet (40 mg total) by mouth daily.  90 tablet  1   No current facility-administered medications on file prior to visit.    Allergies  Allergen Reactions  . Lisinopril     cough  . Atorvastatin     REACTION: leg cramps  . Demerol (Meperidine)     No relief reported  . Ezetimibe-Simvastatin     REACTION: cramps    Past Medical History  Diagnosis Date  . CAD (coronary artery disease)     a. s/p Taxus DES to RCA and PDA in 2006 .b. Myoview 8/11: Walked 12:37. EF 57%. no ischemia or scar. c. Mod CAD by cath 11/2011 (40-50% ISR of PDA -- for OP myoview to assess).  . Hypertension   . Hyperlipidemia   . Allergy   . Bradycardia     Precluding BB use.  Marland Kitchen GERD (gastroesophageal reflux disease)   . Colon polyps   . Diverticulitis   . BPH (benign prostatic hypertrophy)   . Sleep apnea   . Impaired fasting glucose   . Pneumonia   .  Syncope   . Aortic root dilatation     Mild by echo 11/2011 (39mm)    Past Surgical History  Procedure Laterality Date  . Angioplasty      percutaneous transluminal coronary  angioplasty and stenting of the right coronary artery and posterior   . Pilonidal cyst / sinus excision      History  Smoking status  . Former Smoker  . Quit date: 05/13/1978  Smokeless tobacco  . Never Used    History  Alcohol Use  . Yes    Comment: wine nightly (2-3 glasses) if not working    Family History  Problem Relation Age of Onset  . Coronary artery disease Father   . Heart disease Father   . Heart failure Father   . Hypertension Neg Hx   . Diabetes Neg Hx     Reviw of Systems:  Reviewed in the HPI.  All other systems are negative.  Physical Exam: Blood pressure 142/78, pulse 54, height 5\' 7"  (1.702 m), weight 187 lb 12.8 oz (85.186 kg). General: Well developed, well nourished, in no acute distress.  Head: Normocephalic, atraumatic, sclera non-icteric, mucus membranes are moist,   Neck: Supple. Carotids are 2 + without bruits. No JVD    Lungs: Clear   Heart: RR, normal S1 ,S2  Abdomen: Soft, non-tender, non-distended with normal bowel sounds.  Msk:  Strength and tone are normal   Extremities: No clubbing or cyanosis. No edema.  Distal pedal pulses are 2+ and equal    Neuro: CN II - XII intact.  Alert and oriented X 3.   Psych:  Normal   ECG:  November 09, 2012:   Sinus brady at 69.  PACs.  Assessment / Plan:

## 2012-11-16 ENCOUNTER — Encounter: Payer: Self-pay | Admitting: Internal Medicine

## 2012-11-16 ENCOUNTER — Ambulatory Visit (INDEPENDENT_AMBULATORY_CARE_PROVIDER_SITE_OTHER): Payer: Medicare Other | Admitting: Internal Medicine

## 2012-11-16 VITALS — BP 138/72 | HR 66 | Temp 98.4°F | Wt 187.0 lb

## 2012-11-16 DIAGNOSIS — I2510 Atherosclerotic heart disease of native coronary artery without angina pectoris: Secondary | ICD-10-CM

## 2012-11-16 DIAGNOSIS — I10 Essential (primary) hypertension: Secondary | ICD-10-CM

## 2012-11-16 DIAGNOSIS — N400 Enlarged prostate without lower urinary tract symptoms: Secondary | ICD-10-CM

## 2012-11-16 DIAGNOSIS — E785 Hyperlipidemia, unspecified: Secondary | ICD-10-CM

## 2012-11-16 NOTE — Assessment & Plan Note (Signed)
Lab Results  Component Value Date   LDLCALC 105* 11/09/2012   He is trying to improve diet and exercise Would consider trying atorvastatin again to see if the myalgias weren't a problem along with the co-Q 10 he now takes

## 2012-11-16 NOTE — Assessment & Plan Note (Signed)
Has been quiet 

## 2012-11-16 NOTE — Progress Notes (Signed)
Subjective:    Patient ID: Patrick [name], male    DOB: [birth year], 72 y.o.   MRN: [id number]  HPI Here for follow up Feels well Dr Elease Hashimoto is concerned about his cholesterol levels LDL over 100 on recent check Not excited about the expense of crestor. Notes that he didn't take Co-Q 10 with the lipitor--so maybe he could tolerate it  Is working on his diet Weight had gone up to 205#---now back down again Tries to walk 3 miles several times per week (in 45 minutes)  No chest pain No SOB No dizziness or syncope  Voids okay Still with daytime frequency--relates to coffee Nocturia x 1 generally  Current Outpatient Prescriptions on File Prior to Visit  Medication Sig Dispense Refill  . amLODipine (NORVASC) 5 MG tablet Take 1 tablet (5 mg total) by mouth daily.  90 tablet  1  . Ascorbic Acid (VITAMIN C) 1000 MG tablet Take 1,000 mg by mouth daily.        Marland Kitchen aspirin 81 MG tablet Take 81 mg by mouth daily.        . Coenzyme Q10 (CO Q-10) 100 MG CAPS Take by mouth daily.        . fish oil-omega-3 fatty acids 1000 MG capsule Take 2 g by mouth daily.        Marland Kitchen loratadine (CLARITIN) 10 MG tablet Take 10 mg by mouth daily.      Marland Kitchen losartan-hydrochlorothiazide (HYZAAR) 100-25 MG per tablet Take 1 tablet by mouth daily.  90 tablet  3  . nitroGLYCERIN (NITROSTAT) 0.4 MG SL tablet Place 1 tablet (0.4 mg total) under the tongue every 5 (five) minutes x 3 doses as needed for chest pain.  25 tablet  4  . pravastatin (PRAVACHOL) 40 MG tablet Take 1 tablet (40 mg total) by mouth daily.  90 tablet  1   No current facility-administered medications on file prior to visit.    Allergies  Allergen Reactions  . Lisinopril     cough  . Atorvastatin     REACTION: leg cramps  . Demerol (Meperidine)     No relief reported  . Ezetimibe-Simvastatin     REACTION: cramps    Past Medical History  Diagnosis Date  . CAD (coronary artery disease)     a. s/p Taxus DES to RCA and PDA in 2006 .b. Myoview 8/11:  Walked 12:37. EF 57%. no ischemia or scar. c. Mod CAD by cath 11/2011 (40-50% ISR of PDA -- for OP myoview to assess).  . Hypertension   . Hyperlipidemia   . Allergy   . Bradycardia     Precluding BB use.  Marland Kitchen GERD (gastroesophageal reflux disease)   . Colon polyps   . Diverticulitis   . BPH (benign prostatic hypertrophy)   . Sleep apnea   . Impaired fasting glucose   . Pneumonia   . Syncope   . Aortic root dilatation     Mild by echo 11/2011 (39mm)    Past Surgical History  Procedure Laterality Date  . Angioplasty      percutaneous transluminal coronary  angioplasty and stenting of the right coronary artery and posterior   . Pilonidal cyst / sinus excision      Family History  Problem Relation Age of Onset  . Coronary artery disease Father   . Heart disease Father   . Heart failure Father   . Hypertension Neg Hx   . Diabetes Neg Hx     History  Social History  . Marital Status: Single    Spouse Name: N/A    Number of Children: 3  . Years of Education: N/A   Occupational History  . driver part time    Social History Main Topics  . Smoking status: Former Smoker    Quit date: 05/13/1978  . Smokeless tobacco: Never Used  . Alcohol Use: Yes     Comment: wine nightly (2-3 glasses) if not working  . Drug Use: No  . Sexually Active: Yes   Other Topics Concern  . Not on file   Social History Narrative   No living will   Daughter [name] should make medical decisions if he can't   Would accept resuscitation   Wouldn't want prolonged tube feeds if cognitively unaware   Review of Systems Only occasional foot cramps Goes to the chiropractor for his cervical and lumbar disc disease as well as shoulder problems. Goes every 2-4 weeks (Dr Patrici Ranks) Sleeps well    Objective:   Physical Exam  Constitutional: He appears well-developed and well-nourished. No distress.  Neck: Normal range of motion. Neck supple. No thyromegaly present.  Cardiovascular: Normal rate,  regular rhythm and normal heart sounds.  Exam reveals no gallop.   No murmur heard. Pulmonary/Chest: Effort normal and breath sounds normal. No respiratory distress. He has no wheezes. He has no rales.  Abdominal: Soft. There is no tenderness.  Musculoskeletal: He exhibits no edema and no tenderness.  Lymphadenopathy:    He has no cervical adenopathy.  Psychiatric: He has a normal mood and affect. His behavior is normal.          Assessment & Plan:

## 2012-11-16 NOTE — Assessment & Plan Note (Signed)
Mild symptoms---mostly after coffee No meds for now

## 2012-11-16 NOTE — Assessment & Plan Note (Signed)
BP Readings from Last 3 Encounters:  11/16/12 138/72  11/09/12 142/78  05/22/12 130/70   Good control No changes needed

## 2013-03-11 ENCOUNTER — Other Ambulatory Visit: Payer: Self-pay | Admitting: *Deleted

## 2013-03-11 MED ORDER — TRIAMCINOLONE ACETONIDE 0.1 % EX CREA: 1.0000 "application " | TOPICAL_CREAM | Freq: Two times a day (BID) | CUTANEOUS | Status: DC

## 2013-03-11 NOTE — Telephone Encounter (Signed)
Pt calling asking for refill of cream he was prescribed back in 2009 for dry skin behind his ears, pt is states it's TRIAMCINOLONE 0.1% cream, please advise if refills are ok

## 2013-03-11 NOTE — Telephone Encounter (Signed)
rx sent to pharmacy by e-script  

## 2013-03-11 NOTE — Telephone Encounter (Signed)
Okay #60gm x 1 

## 2013-04-05 ENCOUNTER — Other Ambulatory Visit: Payer: Self-pay | Admitting: Internal Medicine

## 2013-05-14 ENCOUNTER — Emergency Department (HOSPITAL_COMMUNITY)
Admission: EM | Admit: 2013-05-14 | Discharge: 2013-05-14 | Disposition: A | Discharge status: Home / Self Care | Payer: Medicare Other | Source: Home / Self Care

## 2013-05-14 ENCOUNTER — Encounter (HOSPITAL_COMMUNITY): Payer: Self-pay | Admitting: Emergency Medicine

## 2013-05-14 DIAGNOSIS — J069 Acute upper respiratory infection, unspecified: Secondary | ICD-10-CM

## 2013-05-14 NOTE — ED Provider Notes (Signed)
Medical screening examination/treatment/procedure(s) were performed by resident physician or non-physician practitioner and as supervising physician I was immediately available for consultation/collaboration.   Pauline Good MD.   Billy Fischer, MD 05/14/13 1329

## 2013-05-14 NOTE — ED Notes (Signed)
Sinus drainage, sore throat and cough, denies ear pain

## 2013-05-14 NOTE — Discharge Instructions (Signed)
Upper Respiratory Infection, Adult Chlor-Trimeton 4 mg. Take 1/2-1 tablet every 6 hours for drainage. May cause drowsiness An upper respiratory infection (URI) is also sometimes known as the common cold. The upper respiratory tract includes the nose, sinuses, throat, trachea, and bronchi. Bronchi are the airways leading to the lungs. Most people improve within 1 week, but symptoms can last up to 2 weeks. A residual cough may last even longer.  CAUSES Many different viruses can infect the tissues lining the upper respiratory tract. The tissues become irritated and inflamed and often become very moist. Mucus production is also common. A cold is contagious. You can easily spread the virus to others by oral contact. This includes kissing, sharing a glass, coughing, or sneezing. Touching your mouth or nose and then touching a surface, which is then touched by another person, can also spread the virus. SYMPTOMS  Symptoms typically develop 1 to 3 days after you come in contact with a cold virus. Symptoms vary from person to person. They may include:  Runny nose.  Sneezing.  Nasal congestion.  Sinus irritation.  Sore throat.  Loss of voice (laryngitis).  Cough.  Fatigue.  Muscle aches.  Loss of appetite.  Headache.  Low-grade fever. DIAGNOSIS  You might diagnose your own cold based on familiar symptoms, since most people get a cold 2 to 3 times a year. Your caregiver can confirm this based on your exam. Most importantly, your caregiver can check that your symptoms are not due to another disease such as strep throat, sinusitis, pneumonia, asthma, or epiglottitis. Blood tests, throat tests, and X-rays are not necessary to diagnose a common cold, but they may sometimes be helpful in excluding other more serious diseases. Your caregiver will decide if any further tests are required. RISKS AND COMPLICATIONS  You may be at risk for a more severe case of the common cold if you smoke cigarettes,  have chronic heart disease (such as heart failure) or lung disease (such as asthma), or if you have a weakened immune system. The very young and very old are also at risk for more serious infections. Bacterial sinusitis, middle ear infections, and bacterial pneumonia can complicate the common cold. The common cold can worsen asthma and chronic obstructive pulmonary disease (COPD). Sometimes, these complications can require emergency medical care and may be life-threatening. PREVENTION  The best way to protect against getting a cold is to practice good hygiene. Avoid oral or hand contact with people with cold symptoms. Wash your hands often if contact occurs. There is no clear evidence that vitamin C, vitamin E, echinacea, or exercise reduces the chance of developing a cold. However, it is always recommended to get plenty of rest and practice good nutrition. TREATMENT  Treatment is directed at relieving symptoms. There is no cure. Antibiotics are not effective, because the infection is caused by a virus, not by bacteria. Treatment may include:  Increased fluid intake. Sports drinks offer valuable electrolytes, sugars, and fluids.  Breathing heated mist or steam (vaporizer or shower).  Eating chicken soup or other clear broths, and maintaining good nutrition.  Getting plenty of rest.  Using gargles or lozenges for comfort.  Controlling fevers with ibuprofen or acetaminophen as directed by your caregiver.  Increasing usage of your inhaler if you have asthma. Zinc gel and zinc lozenges, taken in the first 24 hours of the common cold, can shorten the duration and lessen the severity of symptoms. Pain medicines may help with fever, muscle aches, and throat pain. A  variety of non-prescription medicines are available to treat congestion and runny nose. Your caregiver can make recommendations and may suggest nasal or lung inhalers for other symptoms.  HOME CARE INSTRUCTIONS   Only take over-the-counter  or prescription medicines for pain, discomfort, or fever as directed by your caregiver.  Use a warm mist humidifier or inhale steam from a shower to increase air moisture. This may keep secretions moist and make it easier to breathe.  Drink enough water and fluids to keep your urine clear or pale yellow.  Rest as needed.  Return to work when your temperature has returned to normal or as your caregiver advises. You may need to stay home longer to avoid infecting others. You can also use a face mask and careful hand washing to prevent spread of the virus. SEEK MEDICAL CARE IF:   After the first few days, you feel you are getting worse rather than better.  You need your caregiver's advice about medicines to control symptoms.  You develop chills, worsening shortness of breath, or brown or red sputum. These may be signs of pneumonia.  You develop yellow or brown nasal discharge or pain in the face, especially when you bend forward. These may be signs of sinusitis.  You develop a fever, swollen neck glands, pain with swallowing, or white areas in the back of your throat. These may be signs of strep throat. SEEK IMMEDIATE MEDICAL CARE IF:   You have a fever.  You develop severe or persistent headache, ear pain, sinus pain, or chest pain.  You develop wheezing, a prolonged cough, cough up blood, or have a change in your usual mucus (if you have chronic lung disease).  You develop sore muscles or a stiff neck. Document Released: 10/23/2000 Document Revised: 07/22/2011 Document Reviewed: 08/31/2010 [employer] Patient Information 2014 [place], [place].

## 2013-05-14 NOTE — ED Provider Notes (Signed)
CSN: [id number]     Arrival date & time 05/14/13  1041 History   First MD Initiated Contact with Patient 05/14/13 1210     Chief Complaint  Patient presents with  . URI   (Consider location/radiation/quality/duration/timing/severity/associated sxs/prior Treatment) HPI Comments:  Patient complains of URI symptoms for 4 days. Predominant symptom is PND.   Past Medical History  Diagnosis Date  . CAD (coronary artery disease)     a. s/p Taxus DES to RCA and PDA in 2006 .b. Myoview 8/11: Walked 12:37. EF 57%. no ischemia or scar. c. Mod CAD by cath 11/2011 (40-50% ISR of PDA -- for OP myoview to assess).  . Hypertension   . Hyperlipidemia   . Allergy   . Bradycardia     Precluding BB use.  Marland Kitchen GERD (gastroesophageal reflux disease)   . Colon polyps   . Diverticulitis   . BPH (benign prostatic hypertrophy)   . Sleep apnea   . Impaired fasting glucose   . Pneumonia   . Syncope   . Aortic root dilatation     Mild by echo 11/2011 (59mm)   Past Surgical History  Procedure Laterality Date  . Angioplasty      percutaneous transluminal coronary  angioplasty and stenting of the right coronary artery and posterior   . Pilonidal cyst / sinus excision     Family History  Problem Relation Age of Onset  . Coronary artery disease Father   . Heart disease Father   . Heart failure Father   . Hypertension Neg Hx   . Diabetes Neg Hx    History  Substance Use Topics  . Smoking status: Former Smoker    Quit date: 05/13/1978  . Smokeless tobacco: Never Used  . Alcohol Use: Yes     Comment: wine nightly (2-3 glasses) if not working    Review of Systems  Constitutional: Positive for activity change. Negative for fever, diaphoresis and fatigue.  HENT: Positive for postnasal drip, rhinorrhea, sore throat and trouble swallowing. Negative for ear pain and facial swelling.   Eyes: Negative for pain, discharge and redness.  Respiratory: Positive for cough. Negative for chest tightness and  shortness of breath.   Cardiovascular: Negative.   Gastrointestinal: Negative.   Musculoskeletal: Negative.  Negative for neck pain and neck stiffness.  Skin: Negative for rash.  Neurological: Negative.     Allergies  Lisinopril; Atorvastatin; Demerol; and Ezetimibe-simvastatin  Home Medications   Current Outpatient Rx  Name  Route  Sig  Dispense  Refill  . amLODipine (NORVASC) 5 MG tablet      TAKE ONE (1) TABLET BY MOUTH EVERY DAY   90 tablet   3   . Ascorbic Acid (VITAMIN C) 1000 MG tablet   Oral   Take 1,000 mg by mouth daily.           Marland Kitchen aspirin 81 MG tablet   Oral   Take 81 mg by mouth daily.           . Coenzyme Q10 (CO Q-10) 100 MG CAPS   Oral   Take by mouth daily.           . fish oil-omega-3 fatty acids 1000 MG capsule   Oral   Take 2 g by mouth daily.           Marland Kitchen loratadine (CLARITIN) 10 MG tablet   Oral   Take 10 mg by mouth daily.         Marland Kitchen losartan-hydrochlorothiazide (HYZAAR)  100-25 MG per tablet   Oral   Take 1 tablet by mouth daily.   90 tablet   3   . nitroGLYCERIN (NITROSTAT) 0.4 MG SL tablet   Sublingual   Place 1 tablet (0.4 mg total) under the tongue every 5 (five) minutes x 3 doses as needed for chest pain.   25 tablet   4   . pravastatin (PRAVACHOL) 40 MG tablet      TAKE ONE (1) TABLET BY MOUTH EVERY DAY   90 tablet   3   . triamcinolone cream (KENALOG) 0.1 %   Topical   Apply 1 application topically 2 (two) times daily.   60 g   1    BP 163/66  Pulse 64  Temp(Src) 98 F (36.7 C) (Oral)  Resp 18  SpO2 96% Physical Exam  Nursing note and vitals reviewed. Constitutional: He is oriented to person, place, and time. He appears well-developed and well-nourished. No distress.  HENT:  Bilateral TMs are normal Oropharynx: Unable to visualize patient retracts time and will not allow visualization.   Eyes: Conjunctivae and EOM are normal.  Neck: Normal range of motion. Neck supple.  Cardiovascular: Normal rate,  regular rhythm and normal heart sounds.   Pulmonary/Chest: Effort normal and breath sounds normal. No respiratory distress. He has no wheezes.  Musculoskeletal: Normal range of motion. He exhibits no edema.  Lymphadenopathy:    He has no cervical adenopathy.  Neurological: He is alert and oriented to person, place, and time.  Skin: Skin is warm and dry. No rash noted.  Psychiatric: He has a normal mood and affect.    ED Course  Procedures (including critical care time) Labs Review Labs Reviewed - No data to display Imaging Review No results found.    MDM   1. URI (upper respiratory infection)    Chlor-Trimeton 4 mg 1/2-1 tablet every 6-8 hours when necessary drainage Drink plenty of fluids Any symptoms problems worsening fever may return.   Janne Napoleon, NP 05/14/13 [place], NP 05/14/13 1224

## 2013-08-02 ENCOUNTER — Ambulatory Visit (INDEPENDENT_AMBULATORY_CARE_PROVIDER_SITE_OTHER): Payer: Medicare Other | Admitting: Internal Medicine

## 2013-08-02 ENCOUNTER — Telehealth: Payer: Self-pay | Admitting: Internal Medicine

## 2013-08-02 ENCOUNTER — Encounter: Payer: Self-pay | Admitting: Internal Medicine

## 2013-08-02 VITALS — BP 138/80 | HR 58 | Temp 97.9°F | Ht 67.0 in | Wt 191.0 lb

## 2013-08-02 DIAGNOSIS — I2510 Atherosclerotic heart disease of native coronary artery without angina pectoris: Secondary | ICD-10-CM

## 2013-08-02 DIAGNOSIS — Z0000 Encounter for general adult medical examination without abnormal findings: Secondary | ICD-10-CM

## 2013-08-02 DIAGNOSIS — I10 Essential (primary) hypertension: Secondary | ICD-10-CM

## 2013-08-02 DIAGNOSIS — E785 Hyperlipidemia, unspecified: Secondary | ICD-10-CM

## 2013-08-02 DIAGNOSIS — Z23 Encounter for immunization: Secondary | ICD-10-CM

## 2013-08-02 DIAGNOSIS — N400 Enlarged prostate without lower urinary tract symptoms: Secondary | ICD-10-CM

## 2013-08-02 LAB — CBC WITH DIFFERENTIAL/PLATELET
BASOS ABS: 0 10*3/uL (ref 0.0–0.1)
BASOS PCT: 0.1 % (ref 0.0–3.0)
EOS ABS: 0.2 10*3/uL (ref 0.0–0.7)
Eosinophils Relative: 3.5 % (ref 0.0–5.0)
HEMATOCRIT: 44.4 % (ref 39.0–52.0)
HEMOGLOBIN: 15.1 g/dL (ref 13.0–17.0)
LYMPHS ABS: 1.5 10*3/uL (ref 0.7–4.0)
LYMPHS PCT: 24.5 % (ref 12.0–46.0)
MCHC: 33.9 g/dL (ref 30.0–36.0)
MCV: 91.9 fl (ref 78.0–100.0)
MONOS PCT: 10.6 % (ref 3.0–12.0)
Monocytes Absolute: 0.6 10*3/uL (ref 0.1–1.0)
NEUTROS ABS: 3.7 10*3/uL (ref 1.4–7.7)
Neutrophils Relative %: 61.3 % (ref 43.0–77.0)
Platelets: 302 10*3/uL (ref 150.0–400.0)
RBC: 4.84 Mil/uL (ref 4.22–5.81)
RDW: 13.9 % (ref 11.5–14.6)
WBC: 6.1 10*3/uL (ref 4.5–10.5)

## 2013-08-02 LAB — LIPID PANEL
CHOL/HDL RATIO: 3
Cholesterol: 163 mg/dL (ref 0–200)
HDL: 46.8 mg/dL (ref >39.00)
LDL CALC: 94 mg/dL (ref 0–99)
TRIGLYCERIDES: 110 mg/dL (ref 0.0–149.0)
VLDL: 22 mg/dL (ref 0.0–40.0)

## 2013-08-02 LAB — COMPREHENSIVE METABOLIC PANEL
ALT: 26 U/L (ref 0–53)
AST: 22 U/L (ref 0–37)
Albumin: 4.4 g/dL (ref 3.5–5.2)
Alkaline Phosphatase: 90 U/L (ref 39–117)
BILIRUBIN TOTAL: 0.8 mg/dL (ref 0.3–1.2)
BUN: 23 mg/dL (ref 6–23)
CO2: 29 mEq/L (ref 19–32)
CREATININE: 1.1 mg/dL (ref 0.4–1.5)
Calcium: 9.4 mg/dL (ref 8.4–10.5)
Chloride: 102 mEq/L (ref 96–112)
GFR: 72.85 mL/min (ref >60.00)
Glucose, Bld: 109 mg/dL — ABNORMAL HIGH (ref 70–99)
Potassium: 4.2 mEq/L (ref 3.5–5.1)
Sodium: 138 mEq/L (ref 135–145)
Total Protein: 7.5 g/dL (ref 6.0–8.3)

## 2013-08-02 LAB — T4, FREE: Free T4: 0.79 ng/dL (ref 0.60–1.60)

## 2013-08-02 LAB — TSH: TSH: 0.65 u[IU]/mL (ref 0.35–5.50)

## 2013-08-02 MED ORDER — NITROGLYCERIN 0.4 MG SL SUBL: 0.4000 mg | SUBLINGUAL_TABLET | SUBLINGUAL | Status: DC | PRN

## 2013-08-02 NOTE — Progress Notes (Signed)
Subjective:    Patient ID: Patrick [name], male    DOB: 1940-12-07, 73 y.o.   MRN: [id number]  HPI Here for physical and Medicare wellness Reviewed his form Sees cardiologist and now seeing chiropractor (helping what sounds like frozen right shoulder) Regular alcohol-- 2 per day No tobacco Walks regularly In relationship now Independent with instrumental ADLs Mild memory problems---but no apraxia, executive function issues, etc Hearing is fine. 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 amLODipine (NORVASC) 5 MG tablet TAKE ONE (1) TABLET BY MOUTH EVERY DAY  90 tablet  3  . Ascorbic Acid (VITAMIN C) 1000 MG tablet Take 1,000 mg by mouth daily.        Marland Kitchen aspirin 81 MG tablet Take 81 mg by mouth daily.        . Coenzyme Q10 (CO Q-10) 100 MG CAPS Take by mouth daily.        . fish oil-omega-3 fatty acids 1000 MG capsule Take 2 g by mouth daily.        Marland Kitchen loratadine (CLARITIN) 10 MG tablet Take 10 mg by mouth daily.      Marland Kitchen losartan-hydrochlorothiazide (HYZAAR) 100-25 MG per tablet Take 1 tablet by mouth daily.  90 tablet  3  . nitroGLYCERIN (NITROSTAT) 0.4 MG SL tablet Place 1 tablet (0.4 mg total) under the tongue every 5 (five) minutes x 3 doses as needed for chest pain.  25 tablet  4  . pravastatin (PRAVACHOL) 40 MG tablet TAKE ONE (1) TABLET BY MOUTH EVERY DAY  90 tablet  3  . triamcinolone cream (KENALOG) 0.1 % Apply 1 application topically 2 (two) times daily.  60 g  1   No current facility-administered medications on file prior to visit.    Allergies  Allergen Reactions  . Lisinopril     cough  . Atorvastatin     REACTION: leg  cramps  . Demerol [Meperidine]     No relief reported  . Ezetimibe-Simvastatin     REACTION: cramps    Past Medical History  Diagnosis Date  . CAD (coronary artery disease)     a. s/p Taxus DES to RCA and PDA in 2006 .b. Myoview 8/11: Walked 12:37. EF 57%. no ischemia or scar. c. Mod CAD by cath 11/2011 (40-50% ISR of PDA -- for OP myoview to assess).  . Hypertension   . Hyperlipidemia   . Allergy   . Bradycardia     Precluding BB use.  Marland Kitchen GERD (gastroesophageal reflux disease)   . Colon polyps   . Diverticulitis   . BPH (benign prostatic hypertrophy)   . Sleep apnea   . Impaired fasting glucose   . Pneumonia   . Syncope   . Aortic root dilatation     Mild by echo 11/2011 (59mm)    Past Surgical History  Procedure Laterality Date  . Angioplasty      percutaneous transluminal coronary  angioplasty and stenting of the right coronary artery and posterior   . Pilonidal cyst / sinus excision      Family History  Problem Relation Age of Onset  . Coronary artery disease Father   . Heart disease Father   .  Heart failure Father   . Hypertension Neg Hx   . Diabetes Neg Hx     History   Social History  . Marital Status: Divorced    Spouse Name: N/A    Number of Children: 3  . Years of Education: N/A   Occupational History  . driver part time    Social History Main Topics  . Smoking status: Former Smoker    Quit date: 05/13/1978  . Smokeless tobacco: Never Used  . Alcohol Use: Yes     Comment: wine nightly (2-3 glasses) if not working  . Drug Use: No  . Sexual Activity: Yes   Other Topics Concern  . Not on file   Social History Narrative   No living will   Daughter Benjamine Mola should make medical decisions if he can't   Would accept resuscitation   Wouldn't want prolonged tube feeds if cognitively unaware   Review of Systems  Constitutional: Negative for fatigue and unexpected weight change.       Wears seat belt  HENT: Positive for tinnitus. Negative for  dental problem and hearing loss.        Regular with dentist  Eyes: Negative for visual disturbance.       No diplopia or unilateral vision loss  Respiratory: Negative for cough, chest tightness and shortness of breath.   Cardiovascular: Negative for chest pain, palpitations and leg swelling.  Gastrointestinal: Negative for nausea, vomiting, abdominal pain, constipation and blood in stool.       No heartburn  Endocrine: Negative for cold intolerance and heat intolerance.  Genitourinary: Positive for difficulty urinating. Negative for urgency and frequency.       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 Negative for joint swelling.       Chiropractor helps back also  Skin: Negative for rash.       No suspicious lesions  Allergic/Immunologic: Positive for environmental allergies. Negative for immunocompromised state.       Loratadine effective  Neurological: Negative for dizziness, syncope, weakness, light-headedness, numbness and headaches.  Hematological: Negative for adenopathy. Bruises/bleeds easily.  Psychiatric/Behavioral: Negative for sleep disturbance and dysphoric mood. The patient is not nervous/anxious.        Objective:   Physical Exam  Constitutional: He is oriented to person, place, and time. He appears well-developed and well-nourished. No distress.  HENT:  Head: Normocephalic and atraumatic.  Right Ear: External ear normal.  Left Ear: External ear normal.  Mouth/Throat: Oropharynx is clear and moist. No oropharyngeal exudate.  Eyes: Conjunctivae and EOM are normal. Pupils are equal, round, and reactive to light.  Neck: Normal range of motion. Neck supple. No thyromegaly present.  Cardiovascular: Normal rate, regular rhythm, normal heart sounds and intact distal pulses.  Exam reveals no gallop.   No murmur heard. Pulmonary/Chest: Effort normal and breath sounds normal. No respiratory distress.  He has no wheezes. He has no rales.  Abdominal: Soft. There is no tenderness.  Musculoskeletal: He exhibits no edema and no tenderness.  Lymphadenopathy:    He has no cervical adenopathy.  Neurological: He is alert and oriented to person, place, and time.  President-- "Obama, Anne Hahn again.. Then Clinton" [id number] Recall 3/3  Skin: No rash noted. No erythema.  Psychiatric: He has a normal mood and affect. His behavior is normal.          Assessment & Plan:

## 2013-08-02 NOTE — Assessment & Plan Note (Signed)
I have personally reviewed the Medicare Annual Wellness questionnaire and have noted 1. The patient's medical and social history 2. Their use of alcohol, tobacco or illicit drugs 3. Their current medications and supplements 4. The patient's functional ability including ADL's, fall risks, home safety risks and hearing or visual             impairment. 5. Diet and physical activities 6. Evidence for depression or mood disorders  The patients weight, height, BMI and visual acuity have been recorded in the chart I have made referrals, counseling and provided education to the patient based review of the above and I have provided the pt with a written personalized care plan for preventive services.  I have provided you with a copy of your personalized plan for preventive services. Please take the time to review along with your updated medication list.  Prevnar and Td given No PSA due to age UTD on colon

## 2013-08-02 NOTE — Assessment & Plan Note (Signed)
Didn't tolerate other statins Stick with pravastatin

## 2013-08-02 NOTE — Assessment & Plan Note (Addendum)
Has been controlled On appropriate Rx No beta blocker with bradycardia

## 2013-08-02 NOTE — Addendum Note (Signed)
Addended by: Despina Hidden on: 08/02/2013 11:34 AM   Modules accepted: Orders

## 2013-08-02 NOTE — Telephone Encounter (Signed)
Pt says he is needing refill on his Nitroglycerin he thought he may have forgotten to tell you.

## 2013-08-02 NOTE — Assessment & Plan Note (Signed)
Mild symptoms No Rx for now 

## 2013-08-02 NOTE — Telephone Encounter (Signed)
rx sent to pharmacy by e-script  

## 2013-08-02 NOTE — Assessment & Plan Note (Signed)
BP Readings from Last 3 Encounters:  08/02/13 138/80  05/14/13 163/66  11/16/12 138/72   Good control

## 2013-08-02 NOTE — Progress Notes (Signed)
Pre visit review using our clinic review tool, if applicable. No additional management support is needed unless otherwise documented below in the visit note. 

## 2013-08-03 ENCOUNTER — Telehealth: Payer: Self-pay | Admitting: Internal Medicine

## 2013-08-03 NOTE — Telephone Encounter (Signed)
Relevant patient education mailed to patient.  

## 2013-08-04 ENCOUNTER — Encounter: Payer: Self-pay | Admitting: *Deleted

## 2013-12-30 ENCOUNTER — Other Ambulatory Visit: Payer: Self-pay | Admitting: *Deleted

## 2013-12-30 DIAGNOSIS — E785 Hyperlipidemia, unspecified: Secondary | ICD-10-CM

## 2013-12-30 MED ORDER — LOSARTAN POTASSIUM-HCTZ 100-25 MG PO TABS: 1.0000 | ORAL_TABLET | Freq: Every day | ORAL | Status: DC

## 2014-01-11 HISTORY — PX: CATARACT EXTRACTION W/ INTRAOCULAR LENS  IMPLANT, BILATERAL: SHX1307

## 2014-02-04 ENCOUNTER — Other Ambulatory Visit: Payer: Self-pay

## 2014-02-04 DIAGNOSIS — E785 Hyperlipidemia, unspecified: Secondary | ICD-10-CM

## 2014-02-04 MED ORDER — LOSARTAN POTASSIUM-HCTZ 100-25 MG PO TABS: 1.0000 | ORAL_TABLET | Freq: Every day | ORAL | Status: DC

## 2014-02-18 ENCOUNTER — Ambulatory Visit (INDEPENDENT_AMBULATORY_CARE_PROVIDER_SITE_OTHER): Payer: Medicare Other | Admitting: Cardiovascular Disease

## 2014-02-18 ENCOUNTER — Encounter: Payer: Self-pay | Admitting: Cardiovascular Disease

## 2014-02-18 VITALS — BP 160/90 | HR 63 | Ht 67.0 in | Wt 189.0 lb

## 2014-02-18 DIAGNOSIS — E785 Hyperlipidemia, unspecified: Secondary | ICD-10-CM

## 2014-02-18 DIAGNOSIS — I2510 Atherosclerotic heart disease of native coronary artery without angina pectoris: Secondary | ICD-10-CM

## 2014-02-18 DIAGNOSIS — I10 Essential (primary) hypertension: Secondary | ICD-10-CM

## 2014-02-18 MED ORDER — LOSARTAN POTASSIUM-HCTZ 100-25 MG PO TABS: 1.0000 | ORAL_TABLET | Freq: Every day | ORAL | Status: DC

## 2014-02-18 NOTE — Patient Instructions (Signed)
Continue with current medications.  Follow up with Dr. [name] in 6 months. Labs today: [place] & BMET

## 2014-02-18 NOTE — Assessment & Plan Note (Signed)
Will check lipids today Continue same meds

## 2014-02-18 NOTE — Assessment & Plan Note (Signed)
No symptoms of angina Continue meds He is very active.

## 2014-02-18 NOTE — Progress Notes (Signed)
Patrick [name] Date of Birth  1941-04-24       [facility]    [employer] [address] [place], [place], [place]  [address]   [place], [place]  [address] [phone]     [phone]   Fax  [phone]    Fax [phone]  Problem List: 1. Coronary artery disease-moderate.  He's had a previous stent placed. 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 A subsequent Myoview study was normal. 2. Hypertension 3. Hyperlipidemia 4. Sleep apnea   History of Present Illness:  Patrick [name] is doing very will.   He has been on a mission trip and to [employer].  No further episodes of chest pain.   He hauled 5 loads of hogs last week - his brother raises the hogs.  He is planning on going to [place] over Christmas.  He still does his lawn care business.  He walks at the [employer] - 3 miles a day.  Without symptoms.  November 09, 2012:  [name] is doing well.  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   He is a retired Administrator.   Still walking 3 days a week.    Oct. 9, 2015:  Still very active - mows 8-9 yards a week.  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  No angina. BP is high. Has had a cold - is taking chlorpheneramine  Has had normal Bp at home    Current Outpatient Prescriptions on File Prior to Visit  Medication Sig Dispense Refill  . amLODipine (NORVASC) 5 MG tablet TAKE ONE (1) TABLET BY MOUTH EVERY DAY  90 tablet  3  . Ascorbic Acid (VITAMIN C) 1000 MG tablet Take 1,000 mg by mouth daily.        Marland Kitchen aspirin 81 MG tablet Take 81 mg by mouth daily.        . Coenzyme Q10 (CO Q-10) 100 MG CAPS Take by mouth daily.        . fish oil-omega-3 fatty acids 1000 MG capsule Take 2 g by mouth daily.        Marland Kitchen loratadine (CLARITIN) 10 MG tablet Take 10 mg by mouth daily.      Marland Kitchen losartan-hydrochlorothiazide (HYZAAR) 100-25 MG per tablet Take 1 tablet by mouth daily.  15 tablet  0  . nitroGLYCERIN  (NITROSTAT) 0.4 MG SL tablet Place 1 tablet (0.4 mg total) under the tongue every 5 (five) minutes x 3 doses as needed for chest pain.  25 tablet  4  . pravastatin (PRAVACHOL) 40 MG tablet TAKE ONE (1) TABLET BY MOUTH EVERY DAY  90 tablet  3  . triamcinolone cream (KENALOG) 0.1 % Apply 1 application topically 2 (two) times daily.  60 g  1   No current facility-administered medications on file prior to visit.    Allergies  Allergen Reactions  . Lisinopril     cough  . Atorvastatin     REACTION: leg cramps  . Demerol [Meperidine]     No relief reported  . Ezetimibe-Simvastatin     REACTION: cramps    Past Medical History  Diagnosis Date  . CAD (coronary artery disease)     a. s/p Taxus DES to RCA and PDA in 2006 .b. Myoview 8/11: Walked 12:37. EF 57%. no ischemia or scar. c. Mod CAD by cath 11/2011 (40-50% ISR of PDA -- for OP myoview to assess).  Marland Kitchen  Hypertension   . Hyperlipidemia   . Allergy   . Bradycardia     Precluding BB use.  Marland Kitchen GERD (gastroesophageal reflux disease)   . Colon polyps   . Diverticulitis   . BPH (benign prostatic hypertrophy)   . Sleep apnea   . Impaired fasting glucose   . Pneumonia   . Syncope   . Aortic root dilatation     Mild by echo 11/2011 (66mm)  . Glaucoma     Past Surgical History  Procedure Laterality Date  . Angioplasty      percutaneous transluminal coronary  angioplasty and stenting of the right coronary artery and posterior   . Pilonidal cyst / sinus excision    . Intraocular lens insertion      History  Smoking status  . Former Smoker  . Quit date: 05/13/1978  Smokeless tobacco  . Never Used    History  Alcohol Use  . Yes    Comment: wine nightly (2-3 glasses) if not working    Family History  Problem Relation Age of Onset  . Coronary artery disease Father   . Heart disease Father   . Heart failure Father   . Hypertension Neg Hx   . Diabetes Neg Hx     Reviw of Systems:  Reviewed in the HPI.  All other systems  are negative.  Physical Exam: Blood pressure 160/90, pulse 63, height 5\' 7"  (1.702 m), weight 189 lb (85.73 kg). General: Well developed, well nourished, in no acute distress.  Head: Normocephalic, atraumatic, sclera non-icteric, mucus membranes are moist,   Neck: Supple. Carotids are 2 + without bruits. No JVD   Lungs: Clear   Heart: RR, normal S1 ,S2  Abdomen: Soft, non-tender, non-distended with normal bowel sounds.  Msk:  Strength and tone are normal   Extremities: No clubbing or cyanosis. No edema.  Distal pedal pulses are 2+ and equal    Neuro: CN II - XII intact.  Alert and oriented X 3.   Psych:  Normal   ECG:  Oct. 9, 2015:  Sinus rhythm 63.  Assessment / Plan:

## 2014-02-18 NOTE — Assessment & Plan Note (Signed)
Patrick [name] Has a slightly elevated blood pressure today. His readings typically are in the normal range. He's had a cold this past weeks and this may be causing some hypertension. He's also been on some cold medications.  He'll keep a blood pressure log and will bring that with him at his next visit.  I will see him  in 6 months for followup visit.

## 2014-02-19 LAB — LIPID PANEL
CHOLESTEROL TOTAL: 171 mg/dL (ref 100–199)
Chol/HDL Ratio: 4.1 ratio units (ref 0.0–5.0)
HDL: 42 mg/dL (ref >39)
LDL CALC: 106 mg/dL — AB (ref 0–99)
Triglycerides: 116 mg/dL (ref 0–149)
VLDL CHOLESTEROL CAL: 23 mg/dL (ref 5–40)

## 2014-02-19 LAB — BASIC METABOLIC PANEL
BUN / CREAT RATIO: 16 (ref 10–22)
BUN: 18 mg/dL (ref 8–27)
CHLORIDE: 99 mmol/L (ref 97–108)
CO2: 25 mmol/L (ref 18–29)
Calcium: 9.5 mg/dL (ref 8.6–10.2)
Creatinine, Ser: 1.12 mg/dL (ref 0.76–1.27)
GFR calc non Af Amer: 65 mL/min/{1.73_m2} (ref >59)
GFR, EST AFRICAN AMERICAN: 75 mL/min/{1.73_m2} (ref >59)
Glucose: 109 mg/dL — ABNORMAL HIGH (ref 65–99)
POTASSIUM: 4.6 mmol/L (ref 3.5–5.2)
SODIUM: 139 mmol/L (ref 134–144)

## 2014-02-19 LAB — HEPATIC FUNCTION PANEL
ALT: 24 IU/L (ref 0–44)
AST: 21 IU/L (ref 0–40)
Albumin: 4.5 g/dL (ref 3.5–4.8)
Alkaline Phosphatase: 107 IU/L (ref 39–117)
BILIRUBIN DIRECT: 0.15 mg/dL (ref 0.00–0.40)
TOTAL PROTEIN: 7 g/dL (ref 6.0–8.5)
Total Bilirubin: 0.5 mg/dL (ref 0.0–1.2)

## 2014-02-22 ENCOUNTER — Telehealth: Payer: Self-pay

## 2014-02-22 DIAGNOSIS — Z79899 Other long term (current) drug therapy: Secondary | ICD-10-CM

## 2014-02-22 DIAGNOSIS — E785 Hyperlipidemia, unspecified: Secondary | ICD-10-CM

## 2014-02-22 MED ORDER — EZETIMIBE 10 MG PO TABS: 10.0000 mg | ORAL_TABLET | Freq: Every day | ORAL | Status: DC

## 2014-02-22 NOTE — Telephone Encounter (Signed)
Patient is OK with starting Zetia, along with taking pravachol. He is requesting labs be done at [facility] office at [facility]. Orders entered in epic.

## 2014-02-22 NOTE — Telephone Encounter (Signed)
Left message for patient to call back  

## 2014-02-22 NOTE — Telephone Encounter (Signed)
Message copied by Brett Canales on Tue Feb 22, 2014 11:38 AM ------      Message from: Thayer Headings      Created: Mon Feb 21, 2014 11:57 AM       Does not tolerate statins or zetia.      Continue diet and exercise.      We can consider lipid clinic if he would like ------

## 2014-02-22 NOTE — Telephone Encounter (Signed)
Patient is already taking Pravastatin 40mg . Do you still want him to go to the Lipid clinic?

## 2014-02-22 NOTE — Telephone Encounter (Signed)
I missed that he was on pravachol. Lipids are still too high. Will he start Zetia 10 mg a day Recheck labs in 3 months

## 2014-04-21 ENCOUNTER — Encounter (HOSPITAL_COMMUNITY): Payer: Self-pay | Admitting: Cardiovascular Disease

## 2014-05-09 ENCOUNTER — Other Ambulatory Visit: Payer: Self-pay | Admitting: Internal Medicine

## 2014-05-16 DIAGNOSIS — M9901 Segmental and somatic dysfunction of cervical region: Secondary | ICD-10-CM | POA: Diagnosis not present

## 2014-05-16 DIAGNOSIS — M5412 Radiculopathy, cervical region: Secondary | ICD-10-CM | POA: Diagnosis not present

## 2014-05-16 DIAGNOSIS — M62830 Muscle spasm of back: Secondary | ICD-10-CM | POA: Diagnosis not present

## 2014-05-16 DIAGNOSIS — M9902 Segmental and somatic dysfunction of thoracic region: Secondary | ICD-10-CM | POA: Diagnosis not present

## 2014-05-20 ENCOUNTER — Ambulatory Visit (INDEPENDENT_AMBULATORY_CARE_PROVIDER_SITE_OTHER): Payer: Self-pay | Admitting: Family Medicine

## 2014-05-20 VITALS — BP 138/80 | HR 80 | Temp 98.2°F | Resp 16 | Ht 66.0 in | Wt 190.0 lb

## 2014-05-20 DIAGNOSIS — Z0289 Encounter for other administrative examinations: Secondary | ICD-10-CM

## 2014-05-20 LAB — POCT URINALYSIS DIPSTICK
Bilirubin, UA: NEGATIVE
Blood, UA: NEGATIVE
Glucose, UA: NEGATIVE
Ketones, UA: NEGATIVE
Leukocytes, UA: NEGATIVE
Nitrite, UA: NEGATIVE
Protein, UA: NEGATIVE
SPEC GRAV UA: 1.01
UROBILINOGEN UA: NEGATIVE

## 2014-05-20 NOTE — Progress Notes (Signed)
Games developer Medical Examination   Patrick [name] is a 74 y.o. male who presents today for a commercial driver fitness determination physical exam. The patient reports no problems. The following portions of the patient's history were reviewed and updated as appropriate: allergies, current medications, past family history, past medical history, past social history, past surgical history and problem list. Dr. [name] notes reviewed - pt last had OV with him 3 months prior and was stable. Stress tests results last seen from 2013 when stent was placed. Pt sees cardiology regularly but was not recommended to have any f/u stress tests as he is stable and asymptomatic.  He has a CPE with his PCP sched for 3 months and will continue to f/u with cardiology every 6 months as well.  Review of Systems A comprehensive review of systems was negative.   Objective:    Vision:  Uncorrected Corrected Horizontal Field of Vision  Right Eye 20/30 na 70 degrees  Left Eye  20/20 na 70 degrees  Both Eyes  62/70 na    Applicant can recognize and distinguish among traffic control signals and devices showing standard red, green, and amber colors.     Monocular Vision?: No   Hearing: Can hear whisper at 8 feet bilaterally      BP 138/80 mmHg  Pulse 80  Temp(Src) 98.2 F (36.8 C)  Resp 16  Ht 5\' 6"  (1.676 m)  Wt 190 lb (86.183 kg)  BMI 30.68 kg/m2  SpO2 98%  General Appearance:    Alert, cooperative, no distress, appears stated age  Head:    Normocephalic, without obvious abnormality, atraumatic  Eyes:    PERRL, conjunctiva/corneas clear, EOM's intact, fundi    benign, both eyes  Ears:    Normal TM's and external ear canals, both ears  Nose:   Nares normal, septum midline, mucosa normal, no drainage    or sinus tenderness  Throat:   Lips, mucosa, and tongue normal; teeth and gums normal  Neck:   Supple, symmetrical, trachea midline, no adenopathy;    thyroid:  no enlargement/tenderness/nodules;  no carotid   bruit or JVD  Back:     Symmetric, no curvature, ROM normal, no CVA tenderness  Lungs:     Clear to auscultation bilaterally, respirations unlabored  Chest Wall:    No tenderness or deformity   Heart:    Regular rate and rhythm, S1 and S2 normal, no murmur, rub   or gallop  Breast Exam:    No tenderness, masses, or nipple abnormality  Abdomen:     Soft, non-tender, bowel sounds active all four quadrants,    no masses, no organomegaly  Genitalia:    Normal male without lesion, discharge or tenderness  Rectal:    Normal tone, normal prostate, no masses or tenderness;   guaiac negative stool  Extremities:   Extremities normal, atraumatic, no cyanosis or edema  Pulses:   2+ and symmetric all extremities  Skin:   Skin color, texture, turgor normal, no rashes or lesions  Lymph nodes:   Cervical, supraclavicular, and axillary nodes normal  Neurologic:   CNII-XII intact, normal strength, sensation and reflexes    throughout    Labs: Lab Results  Component Value Date   SPECGRAV 1.010 05/20/2014   PROTEINUR neg 05/20/2014   BILIRUBINUR neg 05/20/2014      Assessment:    Healthy male exam.  Meets standards, but periodic monitoring required due to CAD s/p stent placement in 2013.  Financial planner only  for 1 year.    Plan:    Medical examiners certificate completed and printed. Return as needed.  RTC in 1 yr. Per DOT guidelines, he will need a biannual stress test even though he is asymptomatic and is not medically indicated at this time, he will need to have this done with results available prior to his license renewal next yr -  Noticed to pt's cardiology Dr. [name] sent as Juluis Rainier.

## 2014-05-23 ENCOUNTER — Telehealth: Payer: Self-pay | Admitting: Nurse Practitioner

## 2014-05-23 DIAGNOSIS — I25810 Atherosclerosis of coronary artery bypass graft(s) without angina pectoris: Secondary | ICD-10-CM

## 2014-05-23 NOTE — Telephone Encounter (Signed)
-----   Message from Thayer Headings, MD sent at 05/20/2014  6:30 PM EST ----- Thank you Dr. Brigitte Pulse  We will set him up for a regular stress test.   Thanks  Liam Rogers   ----- Message -----    From: Shawnee Knapp, MD    Sent: 05/20/2014   1:13 PM      To: Thayer Headings, MD  Dr. 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 Thanks, Harmon Pier

## 2014-05-23 NOTE — Telephone Encounter (Signed)
Called patient and left message for him to call me regarding scheduling a stress test.

## 2014-05-24 ENCOUNTER — Telehealth: Payer: Self-pay | Admitting: Cardiovascular Disease

## 2014-05-24 NOTE — Telephone Encounter (Signed)
Per Dr. Raul Del OV note 1/8, patient needs to have his GXT completed and resulted before his re-certification next year. Patient does not wish to have it scheduled now and st he will call to schedule sometime before then at his convenience.  Instructed patient that no lab work is ordered at this time, and that Dr. [name] will order it at next appointment if necessary.

## 2014-05-24 NOTE — Telephone Encounter (Signed)
Spoke with Patrick [name] to set up GXT - per patient he don't need this for a year.   I explain that the note I received stated he needed it soon.   Please gave patient a call to discuss this.   Also he was asking when he needed his labs done.

## 2014-05-25 NOTE — Telephone Encounter (Signed)
See telephone encounter dated 1/12.

## 2014-06-27 DIAGNOSIS — M9903 Segmental and somatic dysfunction of lumbar region: Secondary | ICD-10-CM | POA: Diagnosis not present

## 2014-06-27 DIAGNOSIS — M9905 Segmental and somatic dysfunction of pelvic region: Secondary | ICD-10-CM | POA: Diagnosis not present

## 2014-06-27 DIAGNOSIS — M955 Acquired deformity of pelvis: Secondary | ICD-10-CM | POA: Diagnosis not present

## 2014-06-27 DIAGNOSIS — M5136 Other intervertebral disc degeneration, lumbar region: Secondary | ICD-10-CM | POA: Diagnosis not present

## 2014-07-25 DIAGNOSIS — M5136 Other intervertebral disc degeneration, lumbar region: Secondary | ICD-10-CM | POA: Diagnosis not present

## 2014-07-25 DIAGNOSIS — M9903 Segmental and somatic dysfunction of lumbar region: Secondary | ICD-10-CM | POA: Diagnosis not present

## 2014-07-25 DIAGNOSIS — M9905 Segmental and somatic dysfunction of pelvic region: Secondary | ICD-10-CM | POA: Diagnosis not present

## 2014-07-25 DIAGNOSIS — M955 Acquired deformity of pelvis: Secondary | ICD-10-CM | POA: Diagnosis not present

## 2014-08-08 ENCOUNTER — Encounter: Payer: Self-pay | Admitting: Internal Medicine

## 2014-08-08 ENCOUNTER — Ambulatory Visit (INDEPENDENT_AMBULATORY_CARE_PROVIDER_SITE_OTHER): Payer: Medicare Other | Admitting: Internal Medicine

## 2014-08-08 VITALS — BP 140/80 | HR 78 | Temp 98.1°F | Ht 66.0 in | Wt 192.0 lb

## 2014-08-08 DIAGNOSIS — I2584 Coronary atherosclerosis due to calcified coronary lesion: Secondary | ICD-10-CM

## 2014-08-08 DIAGNOSIS — E785 Hyperlipidemia, unspecified: Secondary | ICD-10-CM | POA: Diagnosis not present

## 2014-08-08 DIAGNOSIS — Z0000 Encounter for general adult medical examination without abnormal findings: Secondary | ICD-10-CM

## 2014-08-08 DIAGNOSIS — I10 Essential (primary) hypertension: Secondary | ICD-10-CM

## 2014-08-08 DIAGNOSIS — N400 Enlarged prostate without lower urinary tract symptoms: Secondary | ICD-10-CM

## 2014-08-08 DIAGNOSIS — K219 Gastro-esophageal reflux disease without esophagitis: Secondary | ICD-10-CM

## 2014-08-08 DIAGNOSIS — I2510 Atherosclerotic heart disease of native coronary artery without angina pectoris: Secondary | ICD-10-CM | POA: Diagnosis not present

## 2014-08-08 DIAGNOSIS — Z7189 Other specified counseling: Secondary | ICD-10-CM

## 2014-08-08 MED ORDER — PRAVASTATIN SODIUM 40 MG PO TABS: 40.0000 mg | ORAL_TABLET | Freq: Every day | ORAL | Status: DC

## 2014-08-08 MED ORDER — NITROGLYCERIN 0.4 MG SL SUBL: 0.4000 mg | SUBLINGUAL_TABLET | SUBLINGUAL | Status: DC | PRN

## 2014-08-08 MED ORDER — AMLODIPINE BESYLATE 5 MG PO TABS: 5.0000 mg | ORAL_TABLET | Freq: Every day | ORAL | Status: DC

## 2014-08-08 NOTE — Assessment & Plan Note (Signed)
Intolerant of multiple meds Only took zetia for 30 days No changes for now (though not at past LDL goal)

## 2014-08-08 NOTE — Assessment & Plan Note (Signed)
Daily symptoms but not enough to warrant meds in his estimation

## 2014-08-08 NOTE — Assessment & Plan Note (Signed)
Has intermittent vague, non exertional chest symptoms Discussed GI issues also No beta blocker due to past bradycardia

## 2014-08-08 NOTE — Assessment & Plan Note (Signed)
See social history Blank forms in chart

## 2014-08-08 NOTE — Assessment & Plan Note (Signed)
BP Readings from Last 3 Encounters:  08/08/14 140/80  05/20/14 138/80  02/18/14 160/90   Acceptable control

## 2014-08-08 NOTE — Progress Notes (Signed)
Pre visit review using our clinic review tool, if applicable. No additional management support is needed unless otherwise documented below in the visit note. 

## 2014-08-08 NOTE — Progress Notes (Signed)
Subjective:    Patient ID: Patrick [name], male    DOB: Nov 05, 1940, 74 y.o.   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 No problems handling finances.  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  No heart problems with exertion Gets some "stuffiness" in left chest with stress or after exertion  Hasn't needed the nitro at all No SOB No dizziness or syncope No palpitations No edema  Mild prostate symptoms Has some daytime frequency and once a night nocturia Some dribbling  Has some heartburn--relates to the hiatal hernia Doesn't take any meds No swallowing problems Mostly if he has a glass of wine after eating  No problems with statin (only tolerates pravastatin) Was put back on zetia---took it for 30 days and then stopped No myalgias  Current Outpatient Prescriptions on File Prior to Visit  Medication Sig Dispense Refill  . Ascorbic Acid (VITAMIN C) 1000 MG tablet Take 1,000 mg by mouth daily.      Marland Kitchen aspirin 81 MG tablet Take 81 mg by mouth daily.      . Coenzyme Q10 (CO Q-10) 100 MG CAPS Take by mouth daily.      . fish oil-omega-3 fatty acids 1000 MG capsule Take 2 g by mouth daily.      Marland Kitchen latanoprost (XALATAN) 0.005 % ophthalmic solution     . loratadine (CLARITIN) 10 MG tablet Take 10 mg by mouth daily.    Marland Kitchen losartan-hydrochlorothiazide (HYZAAR) 100-25 MG per tablet Take 1 tablet by mouth daily. 90 tablet 3  . triamcinolone cream (KENALOG) 0.1 % Apply 1 application  topically 2 (two) times daily. 60 g 1   No current facility-administered medications on file prior to visit.    Allergies  Allergen Reactions  . Lisinopril     cough  . Atorvastatin     REACTION: leg cramps  . Demerol [Meperidine]     No relief reported  . Ezetimibe-Simvastatin     REACTION: cramps    Past Medical History  Diagnosis Date  . CAD (coronary artery disease)     a. s/p Taxus DES to RCA and PDA in 2006 .b. Myoview 8/11: Walked 12:37. EF 57%. no ischemia or scar. c. Mod CAD by cath 11/2011 (40-50% ISR of PDA -- for OP myoview to assess).  . Hypertension   . Hyperlipidemia   . Allergy   . Bradycardia     Precluding BB use.  Marland Kitchen GERD (gastroesophageal reflux disease)   . Colon polyps   . Diverticulitis   . BPH (benign prostatic hypertrophy)   . Sleep apnea   . Impaired fasting glucose   . Syncope   . Aortic root dilatation     Mild by echo 11/2011 (63mm)  . Glaucoma     Past Surgical History  Procedure Laterality Date  . Angioplasty      percutaneous transluminal coronary  angioplasty and stenting of the right coronary artery and posterior   . Pilonidal cyst / sinus excision    . Intraocular lens  insertion    . Left heart catheterization with coronary angiogram N/A 11/26/2011    Procedure: LEFT HEART CATHETERIZATION WITH CORONARY ANGIOGRAM;  Surgeon: Thayer Headings, MD;  Location: [facility] CATH LAB;  Service: Cardiovascular;  Laterality: N/A;  . Cataract extraction w/ intraocular lens  implant, bilateral  9/15    Family History  Problem Relation Age of Onset  . Coronary artery disease Father   . Heart disease Father   . Heart failure Father   . Hypertension Neg Hx   . Diabetes Neg Hx     History   Social History  . Marital Status: Divorced    Spouse Name: N/A  . Number of Children: 3  . Years of Education: N/A   Occupational History  . driver part time    Social History Main Topics  . Smoking status: Former Smoker    Quit date: 05/13/1978  .  Smokeless tobacco: Never Used  . Alcohol Use: Yes     Comment: wine nightly (2-3 glasses) if not working  . Drug Use: No  . Sexual Activity: Yes   Other Topics Concern  . Not on file   Social History Narrative   No living will   Daughter Benjamine Mola should make medical decisions if he can't   Would accept resuscitation   Wouldn't want prolonged tube feeds if cognitively unaware   Review of Systems Sleeps okay Appetite is fine Weight is up a few pounds No problems with bowels Wears seat belt     Objective:   Physical Exam  Constitutional: He is oriented to person, place, and time. He appears well-developed and well-nourished. No distress.  HENT:  Mouth/Throat: Oropharynx is clear and moist. No oropharyngeal exudate.  Neck: Normal range of motion. Neck supple. No thyromegaly present.  Cardiovascular: Normal rate, regular rhythm, normal heart sounds and intact distal pulses.  Exam reveals no gallop.   No murmur heard. Pulmonary/Chest: Effort normal and breath sounds normal. No respiratory distress. He has no wheezes. He has no rales.  Abdominal: Soft. There is no tenderness.  Musculoskeletal: He exhibits no edema or tenderness.  Lymphadenopathy:    He has no cervical adenopathy.  Neurological: He is alert and oriented to person, place, and time.  President-- "Obama, Anne Hahn again.... ?" [id number] Recall 2/3  Skin: No rash noted. No erythema.  Psychiatric: He has a normal mood and affect. His behavior is normal.          Assessment & Plan:

## 2014-08-08 NOTE — Assessment & Plan Note (Signed)
Intermittent symptoms Discussed using preemptive Rx for situations that usually give him symptoms

## 2014-08-08 NOTE — Assessment & Plan Note (Signed)
I have personally reviewed the Medicare Annual Wellness questionnaire and have noted 1. The patient's medical and social history 2. Their use of alcohol, tobacco or illicit drugs 3. Their current medications and supplements 4. The patient's functional ability including ADL's, fall risks, home safety risks and hearing or visual             impairment. 5. Diet and physical activities 6. Evidence for depression or mood disorders  The patients weight, height, BMI and visual acuity have been recorded in the chart I have made referrals, counseling and provided education to the patient based review of the above and I have provided the pt with a written personalized care plan for preventive services.  I have provided you with a copy of your personalized plan for preventive services. Please take the time to review along with your updated medication list.  UTD on immunizations Colonoscopy due next year No PSA due to age Discussed fitness

## 2014-08-22 DIAGNOSIS — M9903 Segmental and somatic dysfunction of lumbar region: Secondary | ICD-10-CM | POA: Diagnosis not present

## 2014-08-22 DIAGNOSIS — M955 Acquired deformity of pelvis: Secondary | ICD-10-CM | POA: Diagnosis not present

## 2014-08-22 DIAGNOSIS — M9905 Segmental and somatic dysfunction of pelvic region: Secondary | ICD-10-CM | POA: Diagnosis not present

## 2014-08-22 DIAGNOSIS — M5136 Other intervertebral disc degeneration, lumbar region: Secondary | ICD-10-CM | POA: Diagnosis not present

## 2014-09-12 ENCOUNTER — Ambulatory Visit: Payer: Self-pay | Admitting: Cardiovascular Disease

## 2014-09-19 ENCOUNTER — Encounter: Payer: Self-pay | Admitting: Cardiovascular Disease

## 2014-09-19 ENCOUNTER — Ambulatory Visit (INDEPENDENT_AMBULATORY_CARE_PROVIDER_SITE_OTHER): Payer: Medicare Other | Admitting: Cardiovascular Disease

## 2014-09-19 VITALS — BP 144/80 | HR 61 | Ht 66.0 in | Wt 194.0 lb

## 2014-09-19 DIAGNOSIS — M9905 Segmental and somatic dysfunction of pelvic region: Secondary | ICD-10-CM | POA: Diagnosis not present

## 2014-09-19 DIAGNOSIS — E785 Hyperlipidemia, unspecified: Secondary | ICD-10-CM

## 2014-09-19 DIAGNOSIS — I10 Essential (primary) hypertension: Secondary | ICD-10-CM | POA: Diagnosis not present

## 2014-09-19 DIAGNOSIS — I2510 Atherosclerotic heart disease of native coronary artery without angina pectoris: Secondary | ICD-10-CM

## 2014-09-19 DIAGNOSIS — M9903 Segmental and somatic dysfunction of lumbar region: Secondary | ICD-10-CM | POA: Diagnosis not present

## 2014-09-19 DIAGNOSIS — M955 Acquired deformity of pelvis: Secondary | ICD-10-CM | POA: Diagnosis not present

## 2014-09-19 DIAGNOSIS — M5136 Other intervertebral disc degeneration, lumbar region: Secondary | ICD-10-CM | POA: Diagnosis not present

## 2014-09-19 LAB — HEPATIC FUNCTION PANEL
ALBUMIN: 4.2 g/dL (ref 3.5–5.2)
ALT: 26 U/L (ref 0–53)
AST: 21 U/L (ref 0–37)
Alkaline Phosphatase: 83 U/L (ref 39–117)
Bilirubin, Direct: 0.1 mg/dL (ref 0.0–0.3)
Total Bilirubin: 0.6 mg/dL (ref 0.2–1.2)
Total Protein: 7.2 g/dL (ref 6.0–8.3)

## 2014-09-19 LAB — LIPID PANEL
Cholesterol: 169 mg/dL (ref 0–200)
HDL: 42.5 mg/dL (ref >39.00)
LDL Cholesterol: 98 mg/dL (ref 0–99)
NONHDL: 126.5
Total CHOL/HDL Ratio: 4
Triglycerides: 141 mg/dL (ref 0.0–149.0)
VLDL: 28.2 mg/dL (ref 0.0–40.0)

## 2014-09-19 LAB — BASIC METABOLIC PANEL
BUN: 22 mg/dL (ref 6–23)
CHLORIDE: 101 meq/L (ref 96–112)
CO2: 30 mEq/L (ref 19–32)
Calcium: 9.4 mg/dL (ref 8.4–10.5)
Creatinine, Ser: 1.03 mg/dL (ref 0.40–1.50)
GFR: 75.06 mL/min (ref >60.00)
Glucose, Bld: 84 mg/dL (ref 70–99)
Potassium: 3.6 mEq/L (ref 3.5–5.1)
Sodium: 136 mEq/L (ref 135–145)

## 2014-09-19 NOTE — Patient Instructions (Signed)
Medication Instructions:  Your physician recommends that you continue on your current medications as directed. Please refer to the Current Medication list given to you today.   Labwork: Your physician recommends that you have lab work:  TODAY (cholesterol, liver, bmet)  Your physician recommends that you return for lab work in: 1 year on the day of or a few days before your office visit with Dr. [name].  You will need to FAST for this appointment - nothing to eat or drink after midnight the night before except water.   Testing/Procedures: None ordered today  Follow-Up: Your physician wants you to follow-up in: 1 year with Dr. [name].  You will receive a reminder letter in the mail two months in advance. If you don't receive a letter, please call our office to schedule the follow-up appointment.

## 2014-09-19 NOTE — Progress Notes (Signed)
Patrick [name] Date of Birth  September 04, 1940       [facility]    [employer] [address], [place], [place]  [address]   [place], [place]  [address] [phone]     [phone]   Fax  [phone]    Fax [phone]  Problem List: 1. Coronary artery disease-moderate.  He's had a previous stent placed. 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 A subsequent Myoview study was normal. 2. Hypertension 3. Hyperlipidemia 4. Sleep apnea   History of Present Illness:  Patrick [name] is doing very will.   He has been on a mission trip and to [employer].  No further episodes of chest pain.   He hauled 5 loads of hogs last week - his brother raises the hogs.  He is planning on going to [place] over Christmas.  He still does his lawn care business.  He walks at the [employer] [place] - 3 miles a day.  Without symptoms.  November 09, 2012:  Patrick [name] is doing well.  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   He is a retired Administrator.   Still walking 3 days a week.    Oct. 9, 2015:  Still very active - mows 8-9 yards a week.  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  No angina. BP is high. Has had a cold - is taking chlorpheneramine  Has had normal Bp at home   Sep 19, 2014:   Still very active.  Drives a truck.  Mows and trims 10 yards a week without any problems.  Has atypical  chest pain with mental stress but never with physical exertion.  Does not restrict his diet.     Current Outpatient Prescriptions on File Prior to Visit  Medication Sig Dispense Refill  . amLODipine (NORVASC) 5 MG tablet Take 1 tablet (5 mg total) by mouth daily. 90 tablet 3  . Ascorbic Acid (VITAMIN C) 1000 MG tablet Take 1,000 mg by mouth daily.      Marland Kitchen aspirin 81 MG tablet Take 81 mg by mouth daily.      . Coenzyme Q10 (CO Q-10) 100 MG CAPS Take by mouth daily.      . fish oil-omega-3 fatty acids 1000 MG capsule Take 2 g  by mouth daily.      Marland Kitchen latanoprost (XALATAN) 0.005 % ophthalmic solution     . loratadine (CLARITIN) 10 MG tablet Take 10 mg by mouth daily.    Marland Kitchen losartan-hydrochlorothiazide (HYZAAR) 100-25 MG per tablet Take 1 tablet by mouth daily. 90 tablet 3  . nitroGLYCERIN (NITROSTAT) 0.4 MG SL tablet Place 1 tablet (0.4 mg total) under the tongue every 5 (five) minutes x 3 doses as needed for chest pain. 25 tablet 0  . pravastatin (PRAVACHOL) 40 MG tablet Take 1 tablet (40 mg total) by mouth daily. 90 tablet 3  . triamcinolone cream (KENALOG) 0.1 % Apply 1 application topically 2 (two) times daily. 60 g 1   No current facility-administered medications on file prior to visit.    Allergies  Allergen Reactions  . Lisinopril     cough  . Atorvastatin     REACTION: leg cramps  . Demerol [Meperidine]     No relief reported  . Ezetimibe-Simvastatin     REACTION: cramps    Past Medical History  Diagnosis Date  . CAD (coronary artery disease)  a. s/p Taxus DES to RCA and PDA in 2006 .b. Myoview 8/11: Walked 12:37. EF 57%. no ischemia or scar. c. Mod CAD by cath 11/2011 (40-50% ISR of PDA -- for OP myoview to assess).  . Hypertension   . Hyperlipidemia   . Allergy   . Bradycardia     Precluding BB use.  Marland Kitchen GERD (gastroesophageal reflux disease)   . Colon polyps   . Diverticulitis   . BPH (benign prostatic hypertrophy)   . Sleep apnea   . Impaired fasting glucose   . Syncope   . Aortic root dilatation     Mild by echo 11/2011 (45mm)  . Glaucoma     Past Surgical History  Procedure Laterality Date  . Angioplasty      percutaneous transluminal coronary  angioplasty and stenting of the right coronary artery and posterior   . Pilonidal cyst / sinus excision    . Intraocular lens insertion    . Left heart catheterization with coronary angiogram N/A 11/26/2011    Procedure: LEFT HEART CATHETERIZATION WITH CORONARY ANGIOGRAM;  Surgeon: Thayer Headings, MD;  Location: [facility] CATH LAB;  Service:  Cardiovascular;  Laterality: N/A;  . Cataract extraction w/ intraocular lens  implant, bilateral  9/15    History  Smoking status  . Former Smoker  . Quit date: 05/13/1978  Smokeless tobacco  . Never Used    History  Alcohol Use  . Yes    Comment: wine nightly (2-3 glasses) if not working    Family History  Problem Relation Age of Onset  . Coronary artery disease Father   . Heart disease Father   . Heart failure Father   . Hypertension Neg Hx   . Diabetes Neg Hx     Reviw of Systems:  Reviewed in the HPI.  All other systems are negative.  Physical Exam: Blood pressure 144/80, pulse 61, height 5\' 6"  (1.676 m), weight 194 lb (87.998 kg). General: Well developed, well nourished, in no acute distress.  Head: Normocephalic, atraumatic, sclera non-icteric, mucus membranes are moist,   Neck: Supple. Carotids are 2 + without bruits. No JVD   Lungs: Clear   Heart: RR, normal S1 ,S2  Abdomen: Soft, non-tender, non-distended with normal bowel sounds.  Msk:  Strength and tone are normal   Extremities: No clubbing or cyanosis. No edema.  Distal pedal pulses are 2+ and equal    Neuro: CN II - XII intact.  Alert and oriented X 3.   Psych:  Normal   ECG:  Oct. 9, 2015:  Sinus rhythm 63.  Assessment / Plan:   1. Coronary artery disease-moderate.  He's had a previous stent placed. 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 A subsequent Myoview study was normal. He's not had any further episodes of angina. Continue current medications. We'll check fasting labs today. I'll see him again in one year.  2. Hypertension- his blood pressures fairly well-controlled. He does not eat a restricted diet. I've encouraged him to work on limiting his salt.  3. Hyperlipidemia- his LDL is still around 100 on Pravachol. We'll check labs today.  We may need to try Atorvastatin  If his LDL is still > 70's .   4. Sleep apnea     Nahser, Wonda Cheng, MD    09/19/2014 5:36 PM    [facility],  [place], [place]  [address] Pager [phone] Phone: [phone]; Fax: [phone]  [employer]  [address] [place], [place]  [address] [phone]    Fax [phone]

## 2014-09-26 DIAGNOSIS — H4011X2 Primary open-angle glaucoma, moderate stage: Secondary | ICD-10-CM | POA: Diagnosis not present

## 2014-10-24 DIAGNOSIS — M955 Acquired deformity of pelvis: Secondary | ICD-10-CM | POA: Diagnosis not present

## 2014-10-24 DIAGNOSIS — M9903 Segmental and somatic dysfunction of lumbar region: Secondary | ICD-10-CM | POA: Diagnosis not present

## 2014-10-24 DIAGNOSIS — M9905 Segmental and somatic dysfunction of pelvic region: Secondary | ICD-10-CM | POA: Diagnosis not present

## 2014-10-24 DIAGNOSIS — M5136 Other intervertebral disc degeneration, lumbar region: Secondary | ICD-10-CM | POA: Diagnosis not present

## 2014-11-09 DIAGNOSIS — L720 Epidermal cyst: Secondary | ICD-10-CM | POA: Diagnosis not present

## 2014-11-09 DIAGNOSIS — L821 Other seborrheic keratosis: Secondary | ICD-10-CM | POA: Diagnosis not present

## 2014-11-09 DIAGNOSIS — D1801 Hemangioma of skin and subcutaneous tissue: Secondary | ICD-10-CM | POA: Diagnosis not present

## 2014-11-09 DIAGNOSIS — L570 Actinic keratosis: Secondary | ICD-10-CM | POA: Diagnosis not present

## 2014-11-09 DIAGNOSIS — D225 Melanocytic nevi of trunk: Secondary | ICD-10-CM | POA: Diagnosis not present

## 2014-11-09 DIAGNOSIS — B078 Other viral warts: Secondary | ICD-10-CM | POA: Diagnosis not present

## 2014-11-09 DIAGNOSIS — L814 Other melanin hyperpigmentation: Secondary | ICD-10-CM | POA: Diagnosis not present

## 2014-11-28 DIAGNOSIS — M5136 Other intervertebral disc degeneration, lumbar region: Secondary | ICD-10-CM | POA: Diagnosis not present

## 2014-11-28 DIAGNOSIS — M9905 Segmental and somatic dysfunction of pelvic region: Secondary | ICD-10-CM | POA: Diagnosis not present

## 2014-11-28 DIAGNOSIS — M955 Acquired deformity of pelvis: Secondary | ICD-10-CM | POA: Diagnosis not present

## 2014-11-28 DIAGNOSIS — M9903 Segmental and somatic dysfunction of lumbar region: Secondary | ICD-10-CM | POA: Diagnosis not present

## 2014-12-25 ENCOUNTER — Ambulatory Visit (INDEPENDENT_AMBULATORY_CARE_PROVIDER_SITE_OTHER): Payer: Medicare Other | Admitting: Family Medicine

## 2014-12-25 VITALS — BP 138/82 | HR 70 | Temp 97.9°F | Resp 17 | Ht 68.25 in | Wt 195.6 lb

## 2014-12-25 DIAGNOSIS — R110 Nausea: Secondary | ICD-10-CM | POA: Diagnosis not present

## 2014-12-25 DIAGNOSIS — M791 Myalgia, unspecified site: Secondary | ICD-10-CM

## 2014-12-25 DIAGNOSIS — R509 Fever, unspecified: Secondary | ICD-10-CM

## 2014-12-25 DIAGNOSIS — S0006XA Insect bite (nonvenomous) of scalp, initial encounter: Secondary | ICD-10-CM

## 2014-12-25 DIAGNOSIS — W57XXXA Bitten or stung by nonvenomous insect and other nonvenomous arthropods, initial encounter: Secondary | ICD-10-CM

## 2014-12-25 MED ORDER — DOXYCYCLINE HYCLATE 100 MG PO TABS: 100.0000 mg | ORAL_TABLET | Freq: Two times a day (BID) | ORAL | Status: DC

## 2014-12-25 NOTE — Progress Notes (Signed)
Subjective:  This chart was scribed for Merri Ray, MD by [facility], medical scribe at [facility].The patient was seen in exam room 05 and the patient's care was started at 10:40 AM.   Patient ID: Patrick [name], male    DOB: November 20, 1940, 74 y.o.   MRN: [id number] Chief Complaint  Patient presents with  . Insect Bite  . Nausea  . Fever   HPI HPI Comments: Patrick [name] is a 74 y.o. male who presents to [facility] complaining of fever, nausea, diarrhea x2 chills, sweats, body aches, abdominal pain and a headache, onset this morning around 2:00 AM. He pulled tick off last night off the posterior scalp. 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 The area he was working does have ticks. According to this history the tick could have been on him for 3-4 days. No vomiting, cough, congestion, and rhinorrhea.   Review of Systems  Constitutional: Positive for fever, chills and diaphoresis.  HENT: Negative for congestion, rhinorrhea and sore throat.   Respiratory: Negative for cough and wheezing.   Gastrointestinal: Positive for nausea, abdominal pain and diarrhea.  Musculoskeletal: Positive for myalgias.  Skin:       Positive for insect bite.  Neurological: Positive for headaches.      Objective:  BP 138/82 mmHg  Pulse 70  Temp(Src) 97.9 F (36.6 C) (Oral)  Resp 17  Ht 5' 8.25" (1.734 m)  Wt 195 lb 9.6 oz (88.724 kg)  BMI 29.51 kg/m2  SpO2 97% Physical Exam  Constitutional: He is oriented to person, place, and time. He appears well-developed and well-nourished. No distress.  HENT:  Head: Normocephalic and atraumatic.  Eyes: Pupils are equal, round, and reactive to light.  Neck: Normal range of motion.  Cardiovascular: Normal rate and regular rhythm.   Pulmonary/Chest: Effort normal. No respiratory distress.  Abdominal: Soft. There is no tenderness.  Musculoskeletal: Normal range of motion.  Neurological: He is alert and  oriented to person, place, and time.  Skin: Skin is warm and dry. No rash noted.  On the posterior scalp there is one small erythematous patch measuring approximately 2-3 mm of area of tick attachment. No visible remaining tick parts.  Psychiatric: He has a normal mood and affect. His behavior is normal.  Nursing note and vitals reviewed.     Assessment & Plan:   Patrick [name] is a 75 y.o. male Tick bite of scalp, initial encounter - Plan: doxycycline (VIBRA-TABS) 100 MG tablet  Myalgia  Fever and chills - Plan: doxycycline (VIBRA-TABS) 100 MG tablet  Nausea without vomiting  Tick bite of posterior scalp, unsure of time of attachment, but could have been there for 3 days. Now with subjective fever chills, myalgias, nausea, And 2 episodes of diarrhea. Possible secondary viral syndrome or intestinal infection, but with previous tick attachment -  differential diagnosis includes RMSF, early.  -Decided to start doxycycline 100 mg twice a day. 10 day supply, but to continue for 3 days after fever resolves. Side effects discussed.  -Symptomatic care discussed, but if nausea, diarrhea, or abdominal pain is not improving in the next 48 at the most 72 hours, return to clinic for evaluation of other causes.  -RTC/ER precautions.  Meds ordered this encounter  Medications  . doxycycline (VIBRA-TABS) 100 MG tablet    Sig: Take 1 tablet (100 mg total) by mouth 2 (two) times daily.    Dispense:  20 tablet  Refill:  0   Patient Instructions  Because we do not know how long the tick was attached, go ahead and start doxycycline to treat for possible early [facility] spotted fever. 1 pill twice per day. This can sometimes cause nausea or upset stomach as well. You need to continue the doxycycline for at least 3 days after fevers have resolved. Let me know if we need to extend the current course. If your abdominal symptoms are not improving in the next 2 to most 3 days, return for recheck to make  sure secondary problem is not occurring. Return to the clinic or go to the nearest emergency room if any of your symptoms worsen or new symptoms occur.   Tick Bite Information Ticks are insects that attach themselves to the skin and draw blood for food. There are various types of ticks. Common types include wood ticks and deer ticks. Most ticks live in shrubs and grassy areas. Ticks can climb onto your body when you make contact with leaves or grass where the tick is waiting. The most common places on the body for ticks to attach themselves are the scalp, neck, armpits, waist, and groin. Most tick bites are harmless, but sometimes ticks carry germs that cause diseases. These germs can be spread to a person during the tick's feeding process. The chance of a disease spreading through a tick bite depends on:   The type of tick.  Time of year.   How long the tick is attached.   Geographic location.  HOW CAN YOU PREVENT TICK BITES? Take these steps to help prevent tick bites when you are outdoors:  Wear protective clothing. Long sleeves and long pants are best.   Wear white clothes so you can see ticks more easily.  Tuck your pant legs into your socks.   If walking on a trail, stay in the middle of the trail to avoid brushing against bushes.  Avoid walking through areas with long grass.  Put insect repellent on all exposed skin and along boot tops, pant legs, and sleeve cuffs.   Check clothing, hair, and skin repeatedly and before going inside.   Brush off any ticks that are not attached.  Take a shower or bath as soon as possible after being outdoors.  WHAT IS THE PROPER WAY TO REMOVE A TICK? Ticks should be removed as soon as possible to help prevent diseases caused by tick bites. 1. If latex gloves are available, put them on before trying to remove a tick.  2. Using fine-point tweezers, grasp the tick as close to the skin as possible. You may also use curved forceps or  a tick removal tool. Grasp the tick as close to its head as possible. Avoid grasping the tick on its body. 3. Pull gently with steady upward pressure until the tick lets go. Do not twist the tick or jerk it suddenly. This may break off the tick's head or mouth parts. 4. Do not squeeze or crush the tick's body. This could force disease-carrying fluids from the tick into your body.  5. After the tick is removed, wash the bite area and your hands with soap and water or other disinfectant such as alcohol. 6. Apply a small amount of antiseptic cream or ointment to the bite site.  7. Wash and disinfect any instruments that were used.  Do not try to remove a tick by applying a hot match, petroleum jelly, or fingernail polish to the tick. These methods do not  work and may increase the chances of disease being spread from the tick bite.  WHEN SHOULD YOU SEEK MEDICAL CARE? Contact your health care provider if you are unable to remove a tick from your skin or if a part of the tick breaks off and is stuck in the skin.  After a tick bite, you need to be aware of signs and symptoms that could be related to diseases spread by ticks. Contact your health care provider if you develop any of the following in the days or weeks after the tick bite:  Unexplained fever.  Rash. A circular rash that appears days or weeks after the tick bite may indicate the possibility of Lyme disease. The rash may resemble a target with a bull's-eye and may occur at a different part of your body than the tick bite.  Redness and swelling in the area of the tick bite.   Tender, swollen lymph glands.   Diarrhea.   Weight loss.   Cough.   Fatigue.   Muscle, joint, or bone pain.   Abdominal pain.   Headache.   Lethargy or a change in your level of consciousness.  Difficulty walking or moving your legs.   Numbness in the legs.   Paralysis.  Shortness of breath.   Confusion.   Repeated vomiting.   Document Released: 04/26/2000 Document Revised: 02/17/2013 Document Reviewed: 10/07/2012 [facility] Patient Information 2015 [place], [place]. This information is not intended to replace advice given to you by your health care provider. Make sure you discuss any questions you have with your health care provider.      I personally performed the services described in this documentation, which was scribed in my presence. The recorded information has been reviewed and considered, and addended by me as needed.

## 2014-12-25 NOTE — Patient Instructions (Signed)
Because we do not know how long the tick was attached, go ahead and start doxycycline to treat for possible early [facility] spotted fever. 1 pill twice per day. This can sometimes cause nausea or upset stomach as well. You need to continue the doxycycline for at least 3 days after fevers have resolved. Let me know if we need to extend the current course. If your abdominal symptoms are not improving in the next 2 to most 3 days, return for recheck to make sure secondary problem is not occurring. Return to the clinic or go to the nearest emergency room if any of your symptoms worsen or new symptoms occur.   Tick Bite Information Ticks are insects that attach themselves to the skin and draw blood for food. There are various types of ticks. Common types include wood ticks and deer ticks. Most ticks live in shrubs and grassy areas. Ticks can climb onto your body when you make contact with leaves or grass where the tick is waiting. The most common places on the body for ticks to attach themselves are the scalp, neck, armpits, waist, and groin. Most tick bites are harmless, but sometimes ticks carry germs that cause diseases. These germs can be spread to a person during the tick's feeding process. The chance of a disease spreading through a tick bite depends on:   The type of tick.  Time of year.   How long the tick is attached.   Geographic location.  HOW CAN YOU PREVENT TICK BITES? Take these steps to help prevent tick bites when you are outdoors:  Wear protective clothing. Long sleeves and long pants are best.   Wear white clothes so you can see ticks more easily.  Tuck your pant legs into your socks.   If walking on a trail, stay in the middle of the trail to avoid brushing against bushes.  Avoid walking through areas with long grass.  Put insect repellent on all exposed skin and along boot tops, pant legs, and sleeve cuffs.   Check clothing, hair, and skin repeatedly and  before going inside.   Brush off any ticks that are not attached.  Take a shower or bath as soon as possible after being outdoors.  WHAT IS THE PROPER WAY TO REMOVE A TICK? Ticks should be removed as soon as possible to help prevent diseases caused by tick bites. 1. If latex gloves are available, put them on before trying to remove a tick.  2. Using fine-point tweezers, grasp the tick as close to the skin as possible. You may also use curved forceps or a tick removal tool. Grasp the tick as close to its head as possible. Avoid grasping the tick on its body. 3. Pull gently with steady upward pressure until the tick lets go. Do not twist the tick or jerk it suddenly. This may break off the tick's head or mouth parts. 4. Do not squeeze or crush the tick's body. This could force disease-carrying fluids from the tick into your body.  5. After the tick is removed, wash the bite area and your hands with soap and water or other disinfectant such as alcohol. 6. Apply a small amount of antiseptic cream or ointment to the bite site.  7. Wash and disinfect any instruments that were used.  Do not try to remove a tick by applying a hot match, petroleum jelly, or fingernail polish to the tick. These methods do not work and may increase the chances of disease being  spread from the tick bite.  WHEN SHOULD YOU SEEK MEDICAL CARE? Contact your health care provider if you are unable to remove a tick from your skin or if a part of the tick breaks off and is stuck in the skin.  After a tick bite, you need to be aware of signs and symptoms that could be related to diseases spread by ticks. Contact your health care provider if you develop any of the following in the days or weeks after the tick bite:  Unexplained fever.  Rash. A circular rash that appears days or weeks after the tick bite may indicate the possibility of Lyme disease. The rash may resemble a target with a bull's-eye and may occur at a different  part of your body than the tick bite.  Redness and swelling in the area of the tick bite.   Tender, swollen lymph glands.   Diarrhea.   Weight loss.   Cough.   Fatigue.   Muscle, joint, or bone pain.   Abdominal pain.   Headache.   Lethargy or a change in your level of consciousness.  Difficulty walking or moving your legs.   Numbness in the legs.   Paralysis.  Shortness of breath.   Confusion.   Repeated vomiting.  Document Released: 04/26/2000 Document Revised: 02/17/2013 Document Reviewed: 10/07/2012 [facility] Patient Information 2015 [place], [place]. This information is not intended to replace advice given to you by your health care provider. Make sure you discuss any questions you have with your health care provider.

## 2014-12-30 DIAGNOSIS — H4011X1 Primary open-angle glaucoma, mild stage: Secondary | ICD-10-CM | POA: Diagnosis not present

## 2015-02-06 DIAGNOSIS — H4011X2 Primary open-angle glaucoma, moderate stage: Secondary | ICD-10-CM | POA: Diagnosis not present

## 2015-03-16 ENCOUNTER — Other Ambulatory Visit: Payer: Self-pay

## 2015-03-16 DIAGNOSIS — E785 Hyperlipidemia, unspecified: Secondary | ICD-10-CM

## 2015-03-16 MED ORDER — LOSARTAN POTASSIUM-HCTZ 100-25 MG PO TABS: 1.0000 | ORAL_TABLET | Freq: Every day | ORAL | Status: DC

## 2015-03-30 NOTE — Progress Notes (Signed)
Patrick [name] Date of Birth  Jul 09, 1940       [facility]    [employer] [address], [place], [place]  [address]   [place], [place]  [address] [phone]     [phone]   Fax  [phone]    Fax [phone]  Problem List: 1. Coronary artery disease-moderate.  He's had a previous stent placed. 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 A subsequent Myoview study was normal. 2. Hypertension 3. Hyperlipidemia 4. Sleep apnea   History of Present Illness:  Patrick [name] is doing very will.   He has been on a mission trip and to [employer].  No further episodes of chest pain.   He hauled 5 loads of hogs last week - his brother raises the hogs.  He is planning on going to [place] over Christmas.  He still does his lawn care business.  He walks at the [employer] park - 3 miles a day.  Without symptoms.  November 09, 2012:  Patrick [name] is doing well.  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   He is a retired Administrator.   Still walking 3 days a week.    Oct. 9, 2015:  Still very active - mows 8-9 yards a week.  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  No angina. BP is high. Has had a cold - is taking chlorpheneramine  Has had normal Bp at home   Sep 19, 2014:   Still very active.  Drives a truck.  Mows and trims 10 yards a week without any problems.  Has atypical  chest pain with mental stress but never with physical exertion.  Does not restrict his diet.   Nov. 18, 2016  Still working hard ( driving a truck and mowing yards)  Gets more short of breath compared to several years ago  Still eats some salty foods.   Has a lady who is cooking for him  - knows that she puts lots of salt in the food  Current Outpatient Prescriptions on File Prior to Visit  Medication Sig Dispense Refill  . amLODipine (NORVASC) 5 MG tablet Take 1 tablet (5 mg total) by mouth daily. 90 tablet 3  .  Ascorbic Acid (VITAMIN C) 1000 MG tablet Take 1,000 mg by mouth daily.      Marland Kitchen aspirin 81 MG tablet Take 81 mg by mouth daily.      . Coenzyme Q10 (CO Q-10) 100 MG CAPS Take by mouth daily.      Marland Kitchen doxycycline (VIBRA-TABS) 100 MG tablet Take 1 tablet (100 mg total) by mouth 2 (two) times daily. 20 tablet 0  . fish oil-omega-3 fatty acids 1000 MG capsule Take 2 g by mouth daily.      Marland Kitchen latanoprost (XALATAN) 0.005 % ophthalmic solution     . loratadine (CLARITIN) 10 MG tablet Take 10 mg by mouth daily.    Marland Kitchen losartan-hydrochlorothiazide (HYZAAR) 100-25 MG tablet Take 1 tablet by mouth daily. 90 tablet 3  . nitroGLYCERIN (NITROSTAT) 0.4 MG SL tablet Place 1 tablet (0.4 mg total) under the tongue every 5 (five) minutes x 3 doses as needed for chest pain. (Patient not taking: Reported on 12/25/2014) 25 tablet 0  . pravastatin (PRAVACHOL) 40 MG tablet Take 1 tablet (40 mg total) by mouth daily. 90 tablet 3  . triamcinolone cream (KENALOG) 0.1 % Apply 1 application topically  2 (two) times daily. 60 g 1   No current facility-administered medications on file prior to visit.    Allergies  Allergen Reactions  . Lisinopril     cough  . Atorvastatin     REACTION: leg cramps  . Demerol [Meperidine]     No relief reported  . Ezetimibe-Simvastatin     REACTION: cramps    Past Medical History  Diagnosis Date  . CAD (coronary artery disease)     a. s/p Taxus DES to RCA and PDA in 2006 .b. Myoview 8/11: Walked 12:37. EF 57%. no ischemia or scar. c. Mod CAD by cath 11/2011 (40-50% ISR of PDA -- for OP myoview to assess).  . Hypertension   . Hyperlipidemia   . Allergy   . Bradycardia     Precluding BB use.  Marland Kitchen GERD (gastroesophageal reflux disease)   . Colon polyps   . Diverticulitis   . BPH (benign prostatic hypertrophy)   . Sleep apnea   . Impaired fasting glucose   . Syncope   . Aortic root dilatation     Mild by echo 11/2011 (82mm)  . Glaucoma     Past Surgical History  Procedure Laterality  Date  . Angioplasty      percutaneous transluminal coronary  angioplasty and stenting of the right coronary artery and posterior   . Pilonidal cyst / sinus excision    . Intraocular lens insertion    . Left heart catheterization with coronary angiogram N/A 11/26/2011    Procedure: LEFT HEART CATHETERIZATION WITH CORONARY ANGIOGRAM;  Surgeon: Thayer Headings, MD;  Location: [facility] C[facility] LAB;  Service: Cardiovascular;  Laterality: N/A;  . Cataract extraction w/ intraocular lens  implant, bilateral  9/15    History  Smoking status  . Former Smoker  . Quit date: 05/13/1978  Smokeless tobacco  . Never Used    History  Alcohol Use  . Yes    Comment: wine nightly (2-3 glasses) if not working    Family History  Problem Relation Age of Onset  . Coronary artery disease Father   . Heart disease Father   . Heart failure Father   . Hypertension Neg Hx   . Diabetes Neg Hx     Reviw of Systems:  Reviewed in the HPI.  All other systems are negative.  Physical Exam: There were no vitals taken for this visit. General: Well developed, well nourished, in no acute distress.  Head: Normocephalic, atraumatic, sclera non-icteric, mucus membranes are moist,   Neck: Supple. Carotids are 2 + without bruits. No JVD   Lungs: Clear   Heart: RR, normal S1 ,S2  Abdomen: Soft, non-tender, non-distended with normal bowel sounds.  Msk:  Strength and tone are normal   Extremities: No clubbing or cyanosis. No edema.  Distal pedal pulses are 2+ and equal    Neuro: CN II - XII intact.  Alert and oriented X 3.   Psych:  Normal   ECG: Sinus bradycardia 55 beats a minute. He has no ST or T wave changes.   Assessment / Plan:   1. Coronary artery disease-moderate.  He's had a previous stent placed. 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 A subsequent Myoview study was normal. He's not had any further episodes of angina. Continue current medications. We'll check  fasting labs today. I'll see him again in 6 months .   2. Hypertension- BP is on the higher side.  Still eats lots of salt.   Encouraged  him to decrease his salt intake   3. Hyperlipidemia-  His last LDL was 98. He tried atorvastatin but had lots of muscle cramps. We'll stop the Pravachol  and start him on Crestor 10 mg a day. I will give him 20 mg tablets and he should take one half tablet a day. We'll check labs in 3 months.  4. Sleep apnea   Nakisha Chai, Wonda Cheng, MD  03/30/2015 9:05 PM    [facility],  [place], [place]  [address] Pager [phone] Phone: [phone]; Fax: [phone]   [facility]  [address] [place], [place]  [address] [phone]    Fax [phone]

## 2015-03-31 ENCOUNTER — Encounter: Payer: Self-pay | Admitting: Cardiovascular Disease

## 2015-03-31 ENCOUNTER — Ambulatory Visit (INDEPENDENT_AMBULATORY_CARE_PROVIDER_SITE_OTHER): Payer: Medicare Other | Admitting: Cardiovascular Disease

## 2015-03-31 VITALS — BP 150/86 | HR 62 | Ht 68.0 in | Wt 195.0 lb

## 2015-03-31 DIAGNOSIS — E785 Hyperlipidemia, unspecified: Secondary | ICD-10-CM | POA: Diagnosis not present

## 2015-03-31 DIAGNOSIS — I2510 Atherosclerotic heart disease of native coronary artery without angina pectoris: Secondary | ICD-10-CM

## 2015-03-31 MED ORDER — ROSUVASTATIN CALCIUM 20 MG PO TABS: 10.0000 mg | ORAL_TABLET | Freq: Every day | ORAL | Status: DC

## 2015-03-31 NOTE — Patient Instructions (Signed)
Medication Instructions:  STOP Pravachol START Crestor 20 mg - take 1/2 tab daily   Labwork: Your physician recommends that you return for lab work in: 3 months - cholesterol, liver, basic metabolic panel You will need to FAST for this appointment - nothing to eat or drink after midnight the night before except water.   Testing/Procedures: None Ordered   Follow-Up: Your physician wants you to follow-up in: 6 months with Dr. [name].  You will receive a reminder letter in the mail two months in advance. If you don't receive a letter, please call our office to schedule the follow-up appointment.   If you need a refill on your cardiac medications before your next appointment, please call your pharmacy.   Thank you for choosing [facility]! Christen Bame, RN [phone]

## 2015-04-26 ENCOUNTER — Encounter: Payer: Self-pay | Admitting: Primary Care

## 2015-04-26 ENCOUNTER — Ambulatory Visit (INDEPENDENT_AMBULATORY_CARE_PROVIDER_SITE_OTHER): Payer: Medicare Other | Admitting: Primary Care

## 2015-04-26 VITALS — BP 144/88 | HR 84 | Temp 98.1°F | Ht 68.0 in | Wt 193.1 lb

## 2015-04-26 DIAGNOSIS — R05 Cough: Secondary | ICD-10-CM | POA: Diagnosis not present

## 2015-04-26 DIAGNOSIS — R059 Cough, unspecified: Secondary | ICD-10-CM

## 2015-04-26 MED ORDER — AZITHROMYCIN 250 MG PO TABS: ORAL_TABLET | ORAL | Status: DC

## 2015-04-26 MED ORDER — HYDROCODONE-HOMATROPINE 5-1.5 MG/5ML PO SYRP: 5.0000 mL | ORAL_SOLUTION | Freq: Every evening | ORAL | Status: DC | PRN

## 2015-04-26 NOTE — Patient Instructions (Signed)
Start Azithromycin antibiotics. Take 2 tablets by mouth today, then 1 tablet daily for 4 additional days.  You may take the Hycodan cough suppressant at bedtime as needed for cough and rest. Caution this medication contains codeine and will make you feel drowsy.  Nasal congestion: Flonase (fluticasone). Instill 2 sprays in each nostril once daily.   Sore throat: Ibuprofen or chloraseptic spray.  Increase fluids and rest. Call me if no improvement in the next 3-4 days.  It was a pleasure meeting you!

## 2015-04-26 NOTE — Progress Notes (Signed)
Subjective:    Patient ID: Patrick [name], male    DOB: 09/20/1940, 73 y.o.   MRN: [id number]  HPI  Patrick [name] is a 74 year old male who presents today with a chief complaint of cough. He also reports nasal congestin and sore throat. His cough has been intermittent for th past 4 weeks and is now productive with yellow/green sputum. Over the the past week he's felt worse. Denies fevers, shortness of breath. He's taken an OTC antihistamine and Nyquil without relief.   Review of Systems  Constitutional: Positive for fatigue. Negative for fever and chills.  HENT: Positive for congestion, ear discharge, sinus pressure and sore throat.   Respiratory: Positive for cough. Negative for shortness of breath.   Cardiovascular: Negative for chest pain.  Gastrointestinal: Negative for nausea.  Musculoskeletal: Positive for myalgias.       Past Medical History  Diagnosis Date  . CAD (coronary artery disease)     a. s/p Taxus DES to RCA and PDA in 2006 .b. Myoview 8/11: Walked 12:37. EF 57%. no ischemia or scar. c. Mod CAD by cath 11/2011 (40-50% ISR of PDA -- for OP myoview to assess).  . Hypertension   . Hyperlipidemia   . Allergy   . Bradycardia     Precluding BB use.  Marland Kitchen GERD (gastroesophageal reflux disease)   . Colon polyps   . Diverticulitis   . BPH (benign prostatic hypertrophy)   . Sleep apnea   . Impaired fasting glucose   . Syncope   . Aortic root dilatation (H[facility])     Mild by echo 11/2011 (86mm)  . Glaucoma     Social History   Social History  . Marital Status: Divorced    Spouse Name: N/A  . Number of Children: 3  . Years of Education: N/A   Occupational History  . driver part time    Social History Main Topics  . Smoking status: Former Smoker    Quit date: 05/13/1978  . Smokeless tobacco: Never Used  . Alcohol Use: Yes     Comment: wine nightly (2-3 glasses) if not working  . Drug Use: No  . Sexual Activity: Yes   Other Topics Concern  . Not on file   Social  History Narrative   No living will   Daughter Benjamine Mola should make medical decisions if he can't   Would accept resuscitation   Wouldn't want prolonged tube feeds if cognitively unaware    Past Surgical History  Procedure Laterality Date  . Angioplasty      percutaneous transluminal coronary  angioplasty and stenting of the right coronary artery and posterior   . Pilonidal cyst / sinus excision    . Intraocular lens insertion    . Left heart catheterization with coronary angiogram N/A 11/26/2011    Procedure: LEFT HEART CATHETERIZATION WITH CORONARY ANGIOGRAM;  Surgeon: Thayer Headings, MD;  Location: [facility] CATH LAB;  Service: Cardiovascular;  Laterality: N/A;  . Cataract extraction w/ intraocular lens  implant, bilateral  9/15    Family History  Problem Relation Age of Onset  . Coronary artery disease Father   . Heart disease Father   . Heart failure Father   . Hypertension Neg Hx   . Diabetes Neg Hx     Allergies  Allergen Reactions  . Lisinopril     cough  . Atorvastatin     REACTION: leg cramps  . Demerol [Meperidine]     No relief reported  .  Ezetimibe-Simvastatin     REACTION: cramps    Current Outpatient Prescriptions on File Prior to Visit  Medication Sig Dispense Refill  . amLODipine (NORVASC) 5 MG tablet Take 1 tablet (5 mg total) by mouth daily. 90 tablet 3  . Ascorbic Acid (VITAMIN C) 1000 MG tablet Take 1,000 mg by mouth daily.      Marland Kitchen aspirin 81 MG tablet Take 81 mg by mouth daily.      . Coenzyme Q10 (CO Q-10) 100 MG CAPS Take by mouth daily.      . fish oil-omega-3 fatty acids 1000 MG capsule Take 2 g by mouth daily.      Marland Kitchen latanoprost (XALATAN) 0.005 % ophthalmic solution     . loratadine (CLARITIN) 10 MG tablet Take 10 mg by mouth daily.    Marland Kitchen losartan-hydrochlorothiazide (HYZAAR) 100-25 MG tablet Take 1 tablet by mouth daily. 90 tablet 3  . nitroGLYCERIN (NITROSTAT) 0.4 MG SL tablet Place 1 tablet (0.4 mg total) under the tongue every 5 (five) minutes x  3 doses as needed for chest pain. 25 tablet 0  . rosuvastatin (CRESTOR) 20 MG tablet Take 0.5 tablets (10 mg total) by mouth daily. 45 tablet 3  . triamcinolone cream (KENALOG) 0.1 % Apply 1 application topically 2 (two) times daily. 60 g 1   No current facility-administered medications on file prior to visit.    BP 144/88 mmHg  Pulse 84  Temp(Src) 98.1 F (36.7 C) (Oral)  Ht 5\' 8"  (1.727 m)  Wt 193 lb 1.9 oz (87.599 kg)  BMI 29.37 kg/m2  SpO2 95%    Objective:   Physical Exam  Constitutional: He appears well-nourished.  HENT:  Right Ear: Tympanic membrane and ear canal normal.  Left Ear: Tympanic membrane and ear canal normal.  Nose: Right sinus exhibits maxillary sinus tenderness. Right sinus exhibits no frontal sinus tenderness. Left sinus exhibits maxillary sinus tenderness. Left sinus exhibits no frontal sinus tenderness.  Mouth/Throat: Oropharynx is clear and moist.  Eyes: Pupils are equal, round, and reactive to light.  Neck: Neck supple.  Cardiovascular: Normal rate and regular rhythm.   Pulmonary/Chest: Effort normal and breath sounds normal.  Lymphadenopathy:    He has no cervical adenopathy.          Assessment & Plan:  Cough:  Cough intermittently x 4 weeks with nasal congestion, fatigue. Now productive with green/yellow sputum, overall feeling worse. Lungs clear, no diminished sounds or evidence of pneumonia.  Due to duation of symptoms, and now feeling worse, with production of sputum, will treat. Zpak sent to pharmacy. Hycodan PRN HS. Flonase, iburprofen, chloraseptic spray. Increase fluids and rest. Follow up pRN.

## 2015-04-26 NOTE — Progress Notes (Signed)
Pre visit review using our clinic review tool, if applicable. No additional management support is needed unless otherwise documented below in the visit note. 

## 2015-05-16 ENCOUNTER — Ambulatory Visit (INDEPENDENT_AMBULATORY_CARE_PROVIDER_SITE_OTHER): Payer: Self-pay | Admitting: Emergency Medicine

## 2015-05-16 VITALS — BP 140/80 | HR 99 | Temp 97.8°F | Resp 16 | Ht 69.0 in | Wt 197.6 lb

## 2015-05-16 DIAGNOSIS — Z021 Encounter for pre-employment examination: Secondary | ICD-10-CM

## 2015-05-16 DIAGNOSIS — Z024 Encounter for examination for driving license: Secondary | ICD-10-CM

## 2015-05-16 NOTE — Progress Notes (Signed)
Subjective:  Patient ID: Patrick [name], male    DOB: 01/19/1941  Age: 75 y.o. 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  History Khyir has a past medical history of CAD (coronary artery disease); Hypertension; Hyperlipidemia; Allergy; Bradycardia; GERD (gastroesophageal reflux disease); Colon polyps; Diverticulitis; BPH (benign prostatic hypertrophy); Sleep apnea; Impaired fasting glucose; Syncope; Aortic root dilatation ([place]); and Glaucoma.   He has past surgical history that includes Angioplasty; Pilonidal cyst / sinus excision; Intraocular lens insertion; left heart catheterization with coronary angiogram (N/A, 11/26/2011); and Cataract extraction w/ intraocular lens  implant, bilateral (9/15).   His  family history includes Coronary artery disease in his father; Heart disease in his father; Heart failure in his father. There is no history of Hypertension or Diabetes.  He   reports that he quit smoking about 37 years ago. He has never used smokeless tobacco. He reports that he drinks alcohol. He reports that he does not use illicit drugs.  Outpatient Prescriptions Prior to Visit  Medication Sig Dispense Refill  . amLODipine (NORVASC) 5 MG tablet Take 1 tablet (5 mg total) by mouth daily. 90 tablet 3  . Ascorbic Acid (VITAMIN C) 1000 MG tablet Take 1,000 mg by mouth daily.      Marland Kitchen aspirin 81 MG tablet Take 81 mg by mouth daily.      . Coenzyme Q10 (CO Q-10) 100 MG CAPS Take by mouth daily.      . fish oil-omega-3 fatty acids 1000 MG capsule Take 2 g by mouth daily.      Marland Kitchen latanoprost (XALATAN) 0.005 % ophthalmic solution     . loratadine (CLARITIN) 10 MG tablet Take 10 mg by mouth daily.    Marland Kitchen losartan-hydrochlorothiazide (HYZAAR) 100-25 MG tablet Take 1 tablet by mouth daily. 90 tablet 3  . nitroGLYCERIN (NITROSTAT) 0.4 MG SL tablet Place 1 tablet (0.4  mg total) under the tongue every 5 (five) minutes x 3 doses as needed for chest pain. 25 tablet 0  . rosuvastatin (CRESTOR) 20 MG tablet Take 0.5 tablets (10 mg total) by mouth daily. 45 tablet 3  . triamcinolone cream (KENALOG) 0.1 % Apply 1 application topically 2 (two) times daily. 60 g 1  . HYDROcodone-homatropine (HYCODAN) 5-1.5 MG/5ML syrup Take 5 mLs by mouth at bedtime as needed. (Patient not taking: Reported on 05/16/2015) 50 mL 0  . azithromycin (ZITHROMAX Z-PAK) 250 MG tablet Take 2 tablets by mouth today, then 1 tablet daily for 4 additional days. 6 each 0   No facility-administered medications prior to visit.    Social History   Social History  . Marital Status: Divorced    Spouse Name: N/A  . Number of Children: 3  . Years of Education: N/A   Occupational History  . driver part time    Social History Main Topics  . Smoking status: Former Smoker    Quit date: 05/13/1978  . Smokeless tobacco: Never Used  . Alcohol Use: Yes     Comment: wine nightly (2-3 glasses) if not working  . Drug Use: No  . Sexual Activity: Yes   Other Topics Concern  . None   Social History Narrative   No living will   Daughter Benjamine Mola should make medical decisions if he can't   Would accept resuscitation   Wouldn't want prolonged tube feeds if cognitively unaware  Review of Systems  Constitutional: Negative for fever, chills and appetite change.  HENT: Negative for congestion, ear pain, postnasal drip, sinus pressure and sore throat.   Eyes: Negative for pain and redness.  Respiratory: Negative for cough, shortness of breath and wheezing.   Cardiovascular: Negative for leg swelling.  Gastrointestinal: Negative for nausea, vomiting, abdominal pain, diarrhea, constipation and blood in stool.  Endocrine: Negative for polyuria.  Genitourinary: Negative for dysuria, urgency, frequency and flank pain.  Musculoskeletal: Negative for gait problem.  Skin: Negative for rash.    Neurological: Negative for weakness and headaches.  Psychiatric/Behavioral: Negative for confusion and decreased concentration. The patient is not nervous/anxious.     Objective:  BP 164/78 mmHg  Pulse 99  Temp(Src) 97.8 F (36.6 C) (Oral)  Resp 16  Ht 5\' 9"  (1.753 m)  Wt 197 lb 9.6 oz (89.631 kg)  BMI 29.17 kg/m2  SpO2 97%  Physical Exam  Constitutional: He is oriented to person, place, and time. He appears well-developed and well-nourished. No distress.  HENT:  Head: Normocephalic and atraumatic.  Right Ear: External ear normal.  Left Ear: External ear normal.  Nose: Nose normal.  Eyes: Conjunctivae and EOM are normal. Pupils are equal, round, and reactive to light. No scleral icterus.  Neck: Normal range of motion. Neck supple. No tracheal deviation present.  Cardiovascular: Normal rate, regular rhythm and normal heart sounds.   Pulmonary/Chest: Effort normal. No respiratory distress. He has no wheezes. He has no rales.  Abdominal: He exhibits no mass. There is no tenderness. There is no rebound and no guarding.  Musculoskeletal: He exhibits no edema.  Lymphadenopathy:    He has no cervical adenopathy.  Neurological: He is alert and oriented to person, place, and time. Coordination normal.  Skin: Skin is warm and dry. No rash noted.  Psychiatric: He has a normal mood and affect. His behavior is normal.      Assessment & Plan:   There are no diagnoses linked to this encounter. I have discontinued Mr. Dimattia azithromycin. I am also having him maintain his aspirin, Co Q-10, fish oil-omega-3 fatty acids, vitamin C, loratadine, triamcinolone cream, latanoprost, nitroGLYCERIN, amLODipine, losartan-hydrochlorothiazide, rosuvastatin, and HYDROcodone-homatropine.  No orders of the defined types were placed in this encounter.    Appropriate red flag conditions were discussed with the patient as well as actions that should be taken.  Patient expressed his  understanding.  Follow-up: No Follow-up on file.  Roselee Culver, MD

## 2015-06-26 DIAGNOSIS — H401122 Primary open-angle glaucoma, left eye, moderate stage: Secondary | ICD-10-CM | POA: Diagnosis not present

## 2015-06-26 DIAGNOSIS — H401112 Primary open-angle glaucoma, right eye, moderate stage: Secondary | ICD-10-CM | POA: Diagnosis not present

## 2015-06-30 ENCOUNTER — Other Ambulatory Visit: Payer: Medicare Other

## 2015-07-03 ENCOUNTER — Other Ambulatory Visit (INDEPENDENT_AMBULATORY_CARE_PROVIDER_SITE_OTHER): Payer: Medicare Other | Admitting: *Deleted

## 2015-07-03 DIAGNOSIS — I2510 Atherosclerotic heart disease of native coronary artery without angina pectoris: Secondary | ICD-10-CM | POA: Diagnosis not present

## 2015-07-03 DIAGNOSIS — E785 Hyperlipidemia, unspecified: Secondary | ICD-10-CM | POA: Diagnosis not present

## 2015-07-03 DIAGNOSIS — I10 Essential (primary) hypertension: Secondary | ICD-10-CM | POA: Diagnosis not present

## 2015-07-03 LAB — BASIC METABOLIC PANEL
BUN: 19 mg/dL (ref 7–25)
CO2: 27 mmol/L (ref 20–31)
Calcium: 9.3 mg/dL (ref 8.6–10.3)
Chloride: 102 mmol/L (ref 98–110)
Creat: 1.03 mg/dL (ref 0.70–1.18)
GLUCOSE: 101 mg/dL — AB (ref 65–99)
POTASSIUM: 4 mmol/L (ref 3.5–5.3)
SODIUM: 138 mmol/L (ref 135–146)

## 2015-07-03 LAB — HEPATIC FUNCTION PANEL
ALT: 23 U/L (ref 9–46)
AST: 18 U/L (ref 10–35)
Albumin: 4.3 g/dL (ref 3.6–5.1)
Alkaline Phosphatase: 80 U/L (ref 40–115)
BILIRUBIN DIRECT: 0.1 mg/dL (ref <=0.2)
BILIRUBIN TOTAL: 0.5 mg/dL (ref 0.2–1.2)
Indirect Bilirubin: 0.4 mg/dL (ref 0.2–1.2)
Total Protein: 7.3 g/dL (ref 6.1–8.1)

## 2015-07-03 LAB — LIPID PANEL
CHOL/HDL RATIO: 3.1 ratio (ref <=5.0)
Cholesterol: 122 mg/dL — ABNORMAL LOW (ref 125–200)
HDL: 40 mg/dL (ref >=40)
LDL Cholesterol: 62 mg/dL (ref <130)
Triglycerides: 101 mg/dL (ref <150)
VLDL: 20 mg/dL (ref <30)

## 2015-07-06 ENCOUNTER — Telehealth: Payer: Self-pay | Admitting: Cardiovascular Disease

## 2015-07-06 NOTE — Telephone Encounter (Signed)
Follow Up   Pt called for Lab results

## 2015-07-06 NOTE — Telephone Encounter (Signed)
Lab results and plan to continue current medications reviewed with patient who verbalized understanding and gratitude for the good results.

## 2015-08-01 ENCOUNTER — Ambulatory Visit: Payer: Medicare Other | Admitting: Internal Medicine

## 2015-08-14 ENCOUNTER — Encounter: Payer: Medicare Other | Admitting: Internal Medicine

## 2015-08-28 ENCOUNTER — Ambulatory Visit (INDEPENDENT_AMBULATORY_CARE_PROVIDER_SITE_OTHER): Payer: Medicare Other

## 2015-08-28 VITALS — BP 142/76 | HR 54 | Temp 97.8°F | Ht 67.0 in | Wt 195.5 lb

## 2015-08-28 DIAGNOSIS — Z0000 Encounter for general adult medical examination without abnormal findings: Secondary | ICD-10-CM | POA: Diagnosis not present

## 2015-08-28 NOTE — Progress Notes (Signed)
Pre visit review using our clinic review tool, if applicable. No additional management support is needed unless otherwise documented below in the visit note. 

## 2015-08-28 NOTE — Progress Notes (Signed)
   Subjective:    Patient ID: Patrick [name], male    DOB: [birth year], 75 y.o.   MRN: [id number]  HPI  I reviewed health advisor's note, was available for consultation, and agree with documentation and plan.   Review of Systems     Objective:   Physical Exam        Assessment & Plan:

## 2015-08-28 NOTE — Patient Instructions (Signed)
Patrick [name] , Thank you for taking time to come for your Medicare Wellness Visit. I appreciate your ongoing commitment to your health goals. Please review the following plan we discussed and let me know if I can assist you in the future.   These are the goals we discussed: Goals    . Increase physical activity     Starting 08/28/2015, I will continue to walk 3-4 miles up to 4 days per week as weather permits.        This is a list of the screening recommended for you and due dates:  Health Maintenance  Topic Date Due  . Flu Shot  12/12/2015  . Colon Cancer Screening  01/15/2021  . Tetanus Vaccine  08/03/2023  . Shingles Vaccine  Completed  . Pneumonia vaccines  Completed   Preventive Care for Adults  A healthy lifestyle and preventive care can promote health and wellness. Preventive health guidelines for adults include the following key practices.  . A routine yearly physical is a good way to check with your health care provider about your health and preventive screening. It is a chance to share any concerns and updates on your health and to receive a thorough exam.  . Visit your dentist for a routine exam and preventive care every 6 months. Brush your teeth twice a day and floss once a day. Good oral hygiene prevents tooth decay and gum disease.  . The frequency of eye exams is based on your age, health, family medical history, use  of contact lenses, and other factors. Follow your health care provider's ecommendations for frequency of eye exams.  . Eat a healthy diet. Foods like vegetables, fruits, whole grains, low-fat dairy products, and lean protein foods contain the nutrients you need without too many calories. Decrease your intake of foods high in solid fats, added sugars, and salt. Eat the right amount of calories for you. Get information about a proper diet from your health care provider, if necessary.  . Regular physical exercise is one of the most important things you can do  for your health. Most adults should get at least 150 minutes of moderate-intensity exercise (any activity that increases your heart rate and causes you to sweat) each week. In addition, most adults need muscle-strengthening exercises on 2 or more days a week.  Silver Sneakers may be a benefit available to you. To determine eligibility, you may visit the website: www.silversneakers.com or contact program at (873)706-0377 Mon-Fri between 8AM-8PM.   . Maintain a healthy weight. The body mass index (BMI) is a screening tool to identify possible weight problems. It provides an estimate of body fat based on height and weight. Your health care provider can find your BMI and can help you achieve or maintain a healthy weight.   For adults 20 years and older: ? A BMI below 18.5 is considered underweight. ? A BMI of 18.5 to 24.9 is normal. ? A BMI of 25 to 29.9 is considered overweight. ? A BMI of 30 and above is considered obese.   . Maintain normal blood lipids and cholesterol levels by exercising and minimizing your intake of saturated fat. Eat a balanced diet with plenty of fruit and vegetables. Blood tests for lipids and cholesterol should begin at age 3 and be repeated every 5 years. If your lipid or cholesterol levels are high, you are over 50, or you are at high risk for heart disease, you may need your cholesterol levels checked more frequently. Ongoing high  lipid and cholesterol levels should be treated with medicines if diet and exercise are not working.  . If you smoke, find out from your health care provider how to quit. If you do not use tobacco, please do not start.  . If you choose to drink alcohol, please do not consume more than 2 drinks per day. One drink is considered to be 12 ounces (355 mL) of beer, 5 ounces (148 mL) of wine, or 1.5 ounces (44 mL) of liquor.  . If you are 68-26 years old, ask your health care provider if you should take aspirin to prevent strokes.  . Use sunscreen.  Apply sunscreen liberally and repeatedly throughout the day. You should seek shade when your shadow is shorter than you. Protect yourself by wearing long sleeves, pants, a wide-brimmed hat, and sunglasses year round, whenever you are outdoors.  . Once a month, do a whole body skin exam, using a mirror to look at the skin on your back. Tell your health care provider of new moles, moles that have irregular borders, moles that are larger than a pencil eraser, or moles that have changed in shape or color.

## 2015-08-28 NOTE — Progress Notes (Signed)
Subjective:   Patrick [name] is a 75 y.o. male who presents for Medicare Annual/Subsequent preventive examination.  Cardiac Risk Factors include: advanced age (>33men, >73 women);dyslipidemia;hypertension;male gender     Objective:    Vitals: BP 142/76 mmHg  Pulse 54  Temp(Src) 97.8 F (36.6 C) (Oral)  Ht 5\' 7"  (1.702 m)  Wt 195 lb 8 oz (88.678 kg)  BMI 30.61 kg/m2  SpO2 95%  Body mass index is 30.61 kg/(m^2).  Tobacco History  Smoking status  . Former Smoker  . Quit date: 05/13/1978  Smokeless tobacco  . Never Used     Counseling given: No   Past Medical History  Diagnosis Date  . CAD (coronary artery disease)     a. s/p Taxus DES to RCA and PDA in 2006 .b. Myoview 8/11: Walked 12:37. EF 57%. no ischemia or scar. c. Mod CAD by cath 11/2011 (40-50% ISR of PDA -- for OP myoview to assess).  . Hypertension   . Hyperlipidemia   . Allergy   . Bradycardia     Precluding BB use.  Marland Kitchen GERD (gastroesophageal reflux disease)   . Colon polyps   . Diverticulitis   . BPH (benign prostatic hypertrophy)   . Sleep apnea   . Impaired fasting glucose   . Syncope   . Aortic root dilatation (HCC)     Mild by echo 11/2011 (24mm)  . Glaucoma    Past Surgical History  Procedure Laterality Date  . Angioplasty      percutaneous transluminal coronary  angioplasty and stenting of the right coronary artery and posterior   . Pilonidal cyst / sinus excision    . Intraocular lens insertion    . Left heart catheterization with coronary angiogram N/A 11/26/2011    Procedure: LEFT HEART CATHETERIZATION WITH CORONARY ANGIOGRAM;  Surgeon: Thayer Headings, MD;  Location: [facility] CATH LAB;  Service: Cardiovascular;  Laterality: N/A;  . Cataract extraction w/ intraocular lens  implant, bilateral  9/15   Family History  Problem Relation Age of Onset  . Coronary artery disease Father   . Heart disease Father   . Heart failure Father   . Hypertension Neg Hx   . Diabetes Neg Hx    History  Sexual  Activity  . Sexual Activity: Yes    Outpatient Encounter Prescriptions as of 08/28/2015  Medication Sig  . amLODipine (NORVASC) 5 MG tablet Take 1 tablet (5 mg total) by mouth daily.  . Ascorbic Acid (VITAMIN C) 1000 MG tablet Take 1,000 mg by mouth daily.    Marland Kitchen aspirin 81 MG tablet Take 81 mg by mouth daily.    . Coenzyme Q10 (CO Q-10) 100 MG CAPS Take by mouth daily.    . fish oil-omega-3 fatty acids 1000 MG capsule Take 2 g by mouth daily.    Marland Kitchen HYDROcodone-homatropine (HYCODAN) 5-1.5 MG/5ML syrup Take 5 mLs by mouth at bedtime as needed.  . latanoprost (XALATAN) 0.005 % ophthalmic solution   . loratadine (CLARITIN) 10 MG tablet Take 10 mg by mouth daily.  Marland Kitchen losartan-hydrochlorothiazide (HYZAAR) 100-25 MG tablet Take 1 tablet by mouth daily.  . rosuvastatin (CRESTOR) 20 MG tablet Take 0.5 tablets (10 mg total) by mouth daily.  Marland Kitchen triamcinolone cream (KENALOG) 0.1 % Apply 1 application topically 2 (two) times daily.  . nitroGLYCERIN (NITROSTAT) 0.4 MG SL tablet Place 1 tablet (0.4 mg total) under the tongue every 5 (five) minutes x 3 doses as needed for chest pain.   No facility-administered encounter  medications on file as of 08/28/2015.    Activities of Daily Living In your present state of health, do you have any difficulty performing the following activities: 08/28/2015  Hearing? Y  Vision? N  Difficulty concentrating or making decisions? N  Walking or climbing stairs? N  Dressing or bathing? N  Doing errands, shopping? N  Preparing Food and eating ? N  Using the Toilet? N  In the past six months, have you accidently leaked urine? N  Do you have problems with loss of bowel control? N  Managing your Medications? N  Managing your Finances? N  Housekeeping or managing your Housekeeping? N    Patient Care Team: Venia Carbon, MD as PCP - General Thayer Headings, MD as Consulting Physician (Cardiology)   Assessment:     Hearing Screening   125Hz  250Hz  500Hz  1000Hz  2000Hz   4000Hz  8000Hz   Right ear:   40 0 40 0   Left ear:   40 0 40 0   Vision Screening Comments: Last eye exam with at [facility] and Dietary recommendations Current Exercise Habits: Home exercise routine, Type of exercise: walking, Time (Minutes): 60, Frequency (Times/Week): 3, Weekly Exercise (Minutes/Week): 180, Intensity: Moderate, Exercise limited by: None identified  Goals    . Increase physical activity     Starting 08/28/2015, I will continue to walk 3-4 miles up to 4 days per week as weather permits.       Fall Risk Fall Risk  08/28/2015 05/16/2015 05/20/2014 08/02/2013 05/22/2012  Falls in the past year? No No No No No   Depression Screen PHQ 2/9 Scores 08/28/2015 05/16/2015 12/25/2014 05/20/2014  PHQ - 2 Score 0 0 0 0    Cognitive Testing MMSE - Mini Mental State Exam 08/28/2015  Orientation to time 5  Orientation to Place 5  Registration 3  Attention/ Calculation 0  Recall 3  Language- name 2 objects 0  Language- repeat 1  Language- follow 3 step command 3  Language- read & follow direction 0  Write a sentence 0  Copy design 0  Total score 20   PLEASE NOTE: A Mini-Cog screen was completed. Maximum score is 20. A value of 0 denotes this part of Folstein MMSE was not completed.  Orientation to Time - Max 5 Orientation to Place - Max 5 Registration - Max 3 Recall - Max 3 Language Repeat - Max 1 Language Follow 3 Step Command - Max 3   Immunization History  Administered Date(s) Administered  . Influenza Split 01/20/2011  . Influenza,inj,Quad PF,36+ Mos 03/01/2014  . Influenza-Unspecified 02/07/2013  . Pneumococcal Conjugate-13 08/02/2013  . Pneumococcal Polysaccharide-23 10/28/2008  . Td 06/05/2002, 08/02/2013  . Zoster 12/28/2010   Screening Tests Health Maintenance  Topic Date Due  . INFLUENZA VACCINE  12/12/2015  . COLONOSCOPY  01/15/2021  . TETANUS/TDAP  08/03/2023  . ZOSTAVAX  Completed  . PNA vac Low Risk Adult  Completed        Plan:     I have personally reviewed and addressed the Medicare Annual Wellness questionnaire and have noted the following in the patient's chart:  A. Medical and social history B. Use of alcohol, tobacco or illicit drugs  C. Current medications and supplements D. Functional ability and status E.  Nutritional status F.  Physical activity G. Advance directives H. List of other physicians I.  Hospitalizations, surgeries, and ER visits in previous 12 months J.  Vitals K. Screenings to include hearing, vision, cognitive, depression  L. Referrals and appointments - none  In addition, I have reviewed and discussed with patient certain preventive protocols, quality metrics, and best practice recommendations. A written personalized care plan for preventive services as well as general preventive health recommendations were provided to patient.  See attached scanned questionnaire for additional information.   Signed,   Lindell Noe, MHA, BS, LPN Health Advisor [id number]

## 2015-08-29 NOTE — Progress Notes (Signed)
I reviewed health advisor's note, was available for consultation, and agree with documentation and plan.  

## 2015-09-05 ENCOUNTER — Other Ambulatory Visit: Payer: Self-pay | Admitting: Internal Medicine

## 2015-10-04 ENCOUNTER — Ambulatory Visit: Payer: Medicare Other | Admitting: Cardiovascular Disease

## 2015-10-16 ENCOUNTER — Encounter: Payer: Self-pay | Admitting: Cardiovascular Disease

## 2015-10-16 ENCOUNTER — Ambulatory Visit (INDEPENDENT_AMBULATORY_CARE_PROVIDER_SITE_OTHER): Payer: Medicare Other | Admitting: Cardiovascular Disease

## 2015-10-16 VITALS — BP 160/80 | HR 55 | Ht 67.0 in | Wt 195.0 lb

## 2015-10-16 DIAGNOSIS — E785 Hyperlipidemia, unspecified: Secondary | ICD-10-CM

## 2015-10-16 DIAGNOSIS — I10 Essential (primary) hypertension: Secondary | ICD-10-CM

## 2015-10-16 DIAGNOSIS — I2510 Atherosclerotic heart disease of native coronary artery without angina pectoris: Secondary | ICD-10-CM

## 2015-10-16 MED ORDER — ROSUVASTATIN CALCIUM 20 MG PO TABS: 10.0000 mg | ORAL_TABLET | Freq: Every day | ORAL | Status: DC

## 2015-10-16 MED ORDER — VALSARTAN-HYDROCHLOROTHIAZIDE 320-25 MG PO TABS: 1.0000 | ORAL_TABLET | Freq: Every day | ORAL | Status: DC

## 2015-10-16 NOTE — Patient Instructions (Addendum)
Medication Instructions:  STOP Losartan/HCT START Valsartan HCT 320-25 mg once daily   Labwork: Your physician recommends that you return for lab work in: 3-4 weeks on Wed. June 28 for basic metabolic panel   Your physician recommends that you return for lab work in: 6 months on the day of or a few days before your office visit with Dr. [name].  You will need to FAST for this appointment - nothing to eat or drink after midnight the night before except water.   Testing/Procedures: None Ordered   Follow-Up: Your physician recommends that you return for Nurse Visit/BP Check on Wednesday June 28 at 9:00 am   Your physician wants you to follow-up in: 6 months with Dr. [name].  You will receive a reminder letter in the mail two months in advance. If you don't receive a letter, please call our office to schedule the follow-up appointment.   If you need a refill on your cardiac medications before your next appointment, please call your pharmacy.   Thank you for choosing [facility]! Christen Bame, RN [phone]

## 2015-10-16 NOTE — Progress Notes (Signed)
Patrick [name] Date of Birth  [birth year]       [facility]    [employer] [address], [place], [place]  [address]   [place], [place]  [address] [phone]     [phone]   Fax  [phone]    Fax [phone]  Problem List: 1. Coronary artery disease-moderate.  He's had a previous stent placed. 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 A subsequent Myoview study was normal. 2. Hypertension 3. Hyperlipidemia 4. Sleep apnea   History of Present Illness:  Patrick [name] is doing very will.   He has been on a mission trip and to [employer].  No further episodes of chest pain.   He hauled 5 loads of hogs last week - his brother raises the hogs.  He is planning on going to [place] over Christmas.  He still does his lawn care business.  He walks at the [employer] - 3 miles a day.  Without symptoms.  November 09, 2012:  Patrick [name] is doing well.  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   He is a retired Administrator.   Still walking 3 days a week.    Oct. 9, 2015:  Still very active - mows 8-9 yards a week.  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  No angina. BP is high. Has had a cold - is taking chlorpheneramine  Has had normal Bp at home   Sep 19, 2014:   Still very active.  Drives a truck.  Mows and trims 10 yards a week without any problems.  Has atypical  chest pain with mental stress but never with physical exertion.  Does not restrict his diet.   Nov. 18, 2016  Still working hard ( driving a truck and mowing yards)  Gets more short of breath compared to several years ago  Still eats some salty foods.   Has a lady who is cooking for him  - knows that she puts lots of salt in the food  October 16, 2015:  Patrick [name] is seen back for his CAD, HTN, HLD . OSA  Just got back from a cruise in [place] .  Still very active.   Mows yards, still drives trucks.      Current Outpatient  Prescriptions on File Prior to Visit  Medication Sig Dispense Refill  . amLODipine (NORVASC) 5 MG tablet TAKE 1 TABLET BY MOUTH DAILY 90 tablet 1  . Ascorbic Acid (VITAMIN C) 1000 MG tablet Take 1,000 mg by mouth daily.      Marland Kitchen aspirin 81 MG tablet Take 81 mg by mouth daily.      . Coenzyme Q10 (CO Q-10) 100 MG CAPS Take 1 capsule by mouth daily.     . fish oil-omega-3 fatty acids 1000 MG capsule Take 2 g by mouth daily.      Marland Kitchen latanoprost (XALATAN) 0.005 % ophthalmic solution Place 1 drop into both eyes at bedtime.     Marland Kitchen loratadine (CLARITIN) 10 MG tablet Take 10 mg by mouth daily as needed for allergies.     Marland Kitchen losartan-hydrochlorothiazide (HYZAAR) 100-25 MG tablet Take 1 tablet by mouth daily. 90 tablet 3  . rosuvastatin (CRESTOR) 20 MG tablet Take 0.5 tablets (10 mg total) by mouth daily. 45 tablet 3  . triamcinolone cream (KENALOG) 0.1 % Apply 1 application topically 2 (two) times daily. 60 g 1  .  nitroGLYCERIN (NITROSTAT) 0.4 MG SL tablet Place 1 tablet (0.4 mg total) under the tongue every 5 (five) minutes x 3 doses as needed for chest pain. 25 tablet 0   No current facility-administered medications on file prior to visit.    Allergies  Allergen Reactions  . Lisinopril     cough  . Atorvastatin     REACTION: leg cramps  . Demerol [Meperidine]     No relief reported  . Ezetimibe-Simvastatin     REACTION: cramps    Past Medical History  Diagnosis Date  . CAD (coronary artery disease)     a. s/p Taxus DES to RCA and PDA in 2006 .b. Myoview 8/11: Walked 12:37. EF 57%. no ischemia or scar. c. Mod CAD by cath 11/2011 (40-50% ISR of PDA -- for OP myoview to assess).  . Hypertension   . Hyperlipidemia   . Allergy   . Bradycardia     Precluding BB use.  Marland Kitchen GERD (gastroesophageal reflux disease)   . Colon polyps   . Diverticulitis   . BPH (benign prostatic hypertrophy)   . Sleep apnea   . Impaired fasting glucose   . Syncope   . Aortic root dilatation (HCC)     Mild by echo  11/2011 (56mm)  . Glaucoma     Past Surgical History  Procedure Laterality Date  . Angioplasty      percutaneous transluminal coronary  angioplasty and stenting of the right coronary artery and posterior   . Pilonidal cyst / sinus excision    . Intraocular lens insertion    . Left heart catheterization with coronary angiogram N/A 11/26/2011    Procedure: LEFT HEART CATHETERIZATION WITH CORONARY ANGIOGRAM;  Surgeon: Thayer Headings, MD;  Location: [facility] CATH LAB;  Service: Cardiovascular;  Laterality: N/A;  . Cataract extraction w/ intraocular lens  implant, bilateral  9/15    History  Smoking status  . Former Smoker  . Quit date: 05/13/1978  Smokeless tobacco  . Never Used    History  Alcohol Use  . Yes    Comment: wine nightly (2-3 glasses) if not working    Family History  Problem Relation Age of Onset  . Coronary artery disease Father   . Heart disease Father   . Heart failure Father   . Hypertension Neg Hx   . Diabetes Neg Hx     Reviw of Systems:  Reviewed in the HPI.  All other systems are negative.  Physical Exam: Blood pressure 160/80, pulse 55, height 5\' 7"  (1.702 m), weight 195 lb (88.451 kg), SpO2 96 %. General: Well developed, well nourished, in no acute distress.  Head: Normocephalic, atraumatic, sclera non-icteric, mucus membranes are moist,   Neck: Supple. Carotids are 2 + without bruits. No JVD   Lungs: Clear   Heart: RR, normal S1 ,S2  Abdomen: Soft, non-tender, non-distended with normal bowel sounds.  Msk:  Strength and tone are normal   Extremities: No clubbing or cyanosis. No edema.  Distal pedal pulses are 2+ and equal    Neuro: CN II - XII intact.  Alert and oriented X 3.   Psych:  Normal   ECG: Sinus bradycardia 55 beats a minute. He has no ST or T wave changes.   Assessment / Plan:   1. Coronary artery disease-moderate.  He's had a previous stent placed. 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  A subsequent Myoview study was normal. He's not had any further episodes of angina. Continue  current medications. We'll check fasting labs today. I'll see him again in 6 months .   2. Hypertension- BP is on the higher side.  Still eats lots of salt.   Encouraged him to decrease his salt intake   3. Hyperlipidemia-  His last LDL was 98. He tried atorvastatin but had lots of muscle cramps. We'll stop the Pravachol  and start him on Crestor 10 mg a day. I will give him 20 mg tablets and he should take one half tablet a day. We'll check labs in 3 months.  4. Sleep apnea   Mertie Moores, MD  10/16/2015 7:44 AM    [facility] [place],  [place], [place]  [address] Pager [phone] Phone: [phone]; Fax: [phone]   [facility]  [address] [place], [place]  [address] [phone]    Fax [phone]

## 2015-11-01 DIAGNOSIS — T1511XA Foreign body in conjunctival sac, right eye, initial encounter: Secondary | ICD-10-CM | POA: Diagnosis not present

## 2015-11-06 DIAGNOSIS — L814 Other melanin hyperpigmentation: Secondary | ICD-10-CM | POA: Diagnosis not present

## 2015-11-06 DIAGNOSIS — D225 Melanocytic nevi of trunk: Secondary | ICD-10-CM | POA: Diagnosis not present

## 2015-11-06 DIAGNOSIS — L821 Other seborrheic keratosis: Secondary | ICD-10-CM | POA: Diagnosis not present

## 2015-11-06 DIAGNOSIS — D1801 Hemangioma of skin and subcutaneous tissue: Secondary | ICD-10-CM | POA: Diagnosis not present

## 2015-11-06 DIAGNOSIS — L820 Inflamed seborrheic keratosis: Secondary | ICD-10-CM | POA: Diagnosis not present

## 2015-11-08 ENCOUNTER — Ambulatory Visit (INDEPENDENT_AMBULATORY_CARE_PROVIDER_SITE_OTHER): Payer: Medicare Other | Admitting: Nurse Practitioner

## 2015-11-08 ENCOUNTER — Other Ambulatory Visit (INDEPENDENT_AMBULATORY_CARE_PROVIDER_SITE_OTHER): Payer: Medicare Other | Admitting: *Deleted

## 2015-11-08 VITALS — BP 132/76 | HR 70 | Resp 16 | Ht 67.0 in

## 2015-11-08 DIAGNOSIS — I2510 Atherosclerotic heart disease of native coronary artery without angina pectoris: Secondary | ICD-10-CM

## 2015-11-08 DIAGNOSIS — I2584 Coronary atherosclerosis due to calcified coronary lesion: Secondary | ICD-10-CM

## 2015-11-08 DIAGNOSIS — E785 Hyperlipidemia, unspecified: Secondary | ICD-10-CM

## 2015-11-08 DIAGNOSIS — I10 Essential (primary) hypertension: Secondary | ICD-10-CM | POA: Diagnosis not present

## 2015-11-08 LAB — BASIC METABOLIC PANEL
BUN: 23 mg/dL (ref 7–25)
CALCIUM: 9.6 mg/dL (ref 8.6–10.3)
CHLORIDE: 102 mmol/L (ref 98–110)
CO2: 26 mmol/L (ref 20–31)
CREATININE: 1.21 mg/dL — AB (ref 0.70–1.18)
Glucose, Bld: 118 mg/dL — ABNORMAL HIGH (ref 65–99)
Potassium: 4.5 mmol/L (ref 3.5–5.3)
Sodium: 141 mmol/L (ref 135–146)

## 2015-11-08 MED ORDER — NITROGLYCERIN 0.4 MG SL SUBL: 0.4000 mg | SUBLINGUAL_TABLET | SUBLINGUAL | Status: DC | PRN

## 2015-11-08 NOTE — Patient Instructions (Signed)
Medication Instructions:  Your physician recommends that you continue on your current medications as directed. Please refer to the Current Medication list given to you today.   Labwork: Done today - will call you with results of your lab work   Testing/Procedures: None Ordered   Follow-Up: Your physician wants you to follow-up in: 6 months with Dr. [name].  You will receive a reminder letter in the mail two months in advance. If you don't receive a letter, please call our office to schedule the follow-up appointment.   If you need a refill on your cardiac medications before your next appointment, please call your pharmacy.   Thank you for choosing [facility]! Christen Bame, RN [phone]

## 2015-11-08 NOTE — Progress Notes (Signed)
1.) Reason for visit: BP check  2.) Name of MD requesting visit: Dr. [name]  3.) H&P: Patient presents for BP Check after starting valsartan/hctz 320/25 mg on 6/5.    4.) ROS related to problem: Patient denies complaints.  Has been monitoring vital signs at home.  Systolic BP has been [id number] mmHg and diastolic BP has been 0000000 mmHg.  Pulse is regularly 58-72 bpm   5.) Assessment and plan per MD: Continue current medications per Dr. [name].  Will review results of bmet and notify patient of any necessary changes

## 2015-11-10 ENCOUNTER — Other Ambulatory Visit: Payer: Self-pay | Admitting: Cardiovascular Disease

## 2015-11-10 MED ORDER — VALSARTAN-HYDROCHLOROTHIAZIDE 320-25 MG PO TABS: 1.0000 | ORAL_TABLET | Freq: Every day | ORAL | Status: DC

## 2015-11-10 NOTE — Telephone Encounter (Signed)
Patient wanted to clarify that he is to continue Valsartan after his lab results. Will send to Dr. [name] for clarification. If patient is to continue, he would like a 90 day supply.

## 2015-11-10 NOTE — Telephone Encounter (Signed)
New message   Pt is calling to speak to rn he verbalized that rn was suppose to verify with Dr.Nasher to see if he should continue taking the  Valsartan HCT 320-25mg   And he wants his lab results

## 2015-11-20 DIAGNOSIS — H401122 Primary open-angle glaucoma, left eye, moderate stage: Secondary | ICD-10-CM | POA: Diagnosis not present

## 2015-11-20 DIAGNOSIS — H401112 Primary open-angle glaucoma, right eye, moderate stage: Secondary | ICD-10-CM | POA: Diagnosis not present

## 2015-11-20 DIAGNOSIS — T1511XD Foreign body in conjunctival sac, right eye, subsequent encounter: Secondary | ICD-10-CM | POA: Diagnosis not present

## 2016-01-09 DIAGNOSIS — H401131 Primary open-angle glaucoma, bilateral, mild stage: Secondary | ICD-10-CM | POA: Diagnosis not present

## 2016-02-27 ENCOUNTER — Encounter: Payer: Self-pay | Admitting: Internal Medicine

## 2016-02-27 ENCOUNTER — Ambulatory Visit (INDEPENDENT_AMBULATORY_CARE_PROVIDER_SITE_OTHER): Payer: Medicare Other | Admitting: Internal Medicine

## 2016-02-27 VITALS — BP 134/76 | HR 56 | Temp 97.7°F | Wt 195.0 lb

## 2016-02-27 DIAGNOSIS — Z23 Encounter for immunization: Secondary | ICD-10-CM | POA: Diagnosis not present

## 2016-02-27 DIAGNOSIS — K2100 Gastro-esophageal reflux disease with esophagitis, without bleeding: Secondary | ICD-10-CM

## 2016-02-27 DIAGNOSIS — E785 Hyperlipidemia, unspecified: Secondary | ICD-10-CM

## 2016-02-27 DIAGNOSIS — N400 Enlarged prostate without lower urinary tract symptoms: Secondary | ICD-10-CM

## 2016-02-27 DIAGNOSIS — I77810 Thoracic aortic ectasia: Secondary | ICD-10-CM | POA: Diagnosis not present

## 2016-02-27 DIAGNOSIS — I10 Essential (primary) hypertension: Secondary | ICD-10-CM | POA: Diagnosis not present

## 2016-02-27 NOTE — Progress Notes (Signed)
Pre visit review using our clinic review tool, if applicable. No additional management support is needed unless otherwise documented below in the visit note. 

## 2016-02-27 NOTE — Addendum Note (Signed)
Addended by: Pilar Grammes on: 02/27/2016 09:00 AM   Modules accepted: Orders

## 2016-02-27 NOTE — Assessment & Plan Note (Signed)
BP Readings from Last 3 Encounters:  02/27/16 134/76  11/08/15 132/76  10/16/15 (!) 160/80   Good control No change needed

## 2016-02-27 NOTE — Assessment & Plan Note (Signed)
Will probably get another EGD by Dr Watt Climes for some dysphagia

## 2016-02-27 NOTE — Progress Notes (Signed)
Subjective:    Patient ID: Patrick [name], male    DOB: May 02, 1941, 75 y.o.   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 amLODipine (NORVASC) 5 MG tablet TAKE 1 TABLET BY MOUTH DAILY 90 tablet 1  . Ascorbic Acid (VITAMIN C) 1000 MG tablet Take 1,000 mg by mouth daily.      Marland Kitchen aspirin 81 MG tablet Take 81 mg by mouth daily.      . Coenzyme Q10 (CO Q-10) 100 MG CAPS Take 1 capsule by mouth daily.     . fish oil-omega-3 fatty acids 1000 MG capsule Take 2 g by mouth daily.      Marland Kitchen latanoprost (XALATAN) 0.005 % ophthalmic solution Place 1 drop into both eyes at bedtime.     . nitroGLYCERIN (NITROSTAT) 0.4 MG SL tablet Place 1 tablet (0.4 mg total) under the tongue every 5 (five) minutes x 3 doses as needed for chest pain. 25 tablet 6  . rosuvastatin (CRESTOR) 20 MG tablet Take 0.5 tablets (10 mg total) by mouth daily. 45 tablet 3  . triamcinolone cream (KENALOG) 0.1 % Apply 1 application topically 2 (two) times daily. 60 g 1  . valsartan-hydrochlorothiazide (DIOVAN-HCT) 320-25 MG tablet Take 1 tablet by mouth daily. 90 tablet 3  . loratadine (CLARITIN) 10 MG tablet Take 10 mg by mouth daily as needed for allergies.      No current facility-administered medications on file prior to visit.     Allergies  Allergen Reactions  . Lisinopril     cough  . Atorvastatin     REACTION: leg cramps  . Demerol [Meperidine]     No relief reported  .  Ezetimibe-Simvastatin     REACTION: cramps    Past Medical History:  Diagnosis Date  . Allergy   . Aortic root dilatation (HCC)    Mild by echo 11/2011 (1mm)  . BPH (benign prostatic hypertrophy)   . Bradycardia    Precluding BB use.  Marland Kitchen CAD (coronary artery disease)    a. s/p Taxus DES to RCA and PDA in 2006 .b. Myoview 8/11: Walked 12:37. EF 57%. no ischemia or scar. c. Mod CAD by cath 11/2011 (40-50% ISR of PDA -- for OP myoview to assess).  . Colon polyps   . Diverticulitis   . GERD (gastroesophageal reflux disease)   . Glaucoma   . Hyperlipidemia   . Hypertension   . Impaired fasting glucose   . Sleep apnea   . Syncope     Past Surgical History:  Procedure Laterality Date  . ANGIOPLASTY     percutaneous transluminal coronary  angioplasty and stenting of the right coronary artery and posterior   . CATARACT EXTRACTION W/ INTRAOCULAR LENS  IMPLANT, BILATERAL  9/15  . INTRAOCULAR LENS INSERTION    . LEFT HEART CATHETERIZATION WITH CORONARY ANGIOGRAM N/A 11/26/2011   Procedure: LEFT HEART CATHETERIZATION WITH CORONARY ANGIOGRAM;  Surgeon: Arnette Norris  Deboraha Sprang, MD;  Location: [facility];  Service: Cardiovascular;  Laterality: N/A;  . PILONIDAL CYST / SINUS EXCISION      Family History  Problem Relation Age of Onset  . Coronary artery disease Father   . Heart disease Father   . Heart failure Father   . Cancer Mother     ovarian cancer  . Hypertension Neg Hx   . Diabetes Neg Hx     Social History   Social History  . Marital status: Divorced    Spouse name: N/A  . Number of children: 3  . Years of education: N/A   Occupational History  . driver part time Retired   Social History Main Topics  . Smoking status: Former Smoker    Quit date: 05/13/1978  . Smokeless tobacco: Never Used  . Alcohol use Yes     Comment: wine nightly (2-3 glasses) if not working  . Drug use: No  . Sexual activity: Yes   Other Topics Concern  . Not on file   Social History Narrative    No living will   Daughter Benjamine Mola should make medical decisions if he can't   Would accept resuscitation   Wouldn't want prolonged tube feeds if cognitively unaware   Review of Systems Appetite is good Weight stable recently--but up over 10# in past 5 years Hasn't been exercising much----too much time on mower Sleeps well    Objective:   Physical Exam  Constitutional: He appears well-developed and well-nourished. No distress.  Neck: Normal range of motion. Neck supple.  Cardiovascular: Normal rate, regular rhythm, normal heart sounds and intact distal pulses.  Exam reveals no gallop.   No murmur heard. Pulmonary/Chest: Effort normal and breath sounds normal. No respiratory distress. He has no wheezes. He has no rales.  Abdominal: Soft. There is no tenderness.  Musculoskeletal: He exhibits no edema or tenderness.  Lymphadenopathy:    He has no cervical adenopathy.  Psychiatric: He has a normal mood and affect. His behavior is normal.          Assessment & Plan:

## 2016-02-27 NOTE — Assessment & Plan Note (Signed)
Very mild Will leave follow up to Dr [name]

## 2016-02-27 NOTE — Assessment & Plan Note (Signed)
Doing well No Rx needed

## 2016-02-27 NOTE — Assessment & Plan Note (Signed)
Tolerating the statin  Will check the labs

## 2016-03-16 ENCOUNTER — Encounter (HOSPITAL_COMMUNITY): Payer: Self-pay | Admitting: Emergency Medicine

## 2016-03-16 ENCOUNTER — Ambulatory Visit (HOSPITAL_COMMUNITY)
Admission: EM | Admit: 2016-03-16 | Discharge: 2016-03-16 | Disposition: A | Discharge status: Home / Self Care | Payer: Medicare Other | Attending: Emergency Medicine | Admitting: Emergency Medicine

## 2016-03-16 DIAGNOSIS — R21 Rash and other nonspecific skin eruption: Secondary | ICD-10-CM

## 2016-03-16 MED ORDER — METHYLPREDNISOLONE ACETATE 80 MG/ML IJ SUSP
80.0000 mg | Freq: Once | INTRAMUSCULAR | Status: AC
  Administered 2016-03-16: 80 mg via INTRAMUSCULAR

## 2016-03-16 MED ORDER — PREDNISONE 10 MG (21) PO TBPK: ORAL_TABLET | ORAL | 0 refills | Status: DC

## 2016-03-16 MED ORDER — METHYLPREDNISOLONE ACETATE 80 MG/ML IJ SUSP
INTRAMUSCULAR | Status: AC
Start: 2016-03-16 — End: 2016-03-16
  Filled 2016-03-16: qty 1

## 2016-03-16 NOTE — Discharge Instructions (Signed)
You were given a shot of a steroid (DepoMedrol) today to help with the rash. Start Prednisone 6 tablets tomorrow and decrease by 1 tablet each day until finished. Apply cool compresses to area for comfort. Follow-up with your primary care provider in 2 days if not improving.

## 2016-03-16 NOTE — ED Provider Notes (Signed)
CSN: [id number]     Arrival date & time 03/16/16  1350 History   First MD Initiated Contact with Patient 03/16/16 1555     Chief Complaint  Patient presents with  . Rash   (Consider location/radiation/quality/duration/timing/severity/associated sxs/prior Treatment) 75 year old male presents with rash that started on his neck yesterday and now has spread to left side of his face and right arm near his elbow. Area is itchy but no new detergents or soaps. 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 Has not tried any medication for symptoms.    The history is provided by the patient.    Past Medical History:  Diagnosis Date  . Allergy   . Aortic root dilatation (HCC)    Mild by echo 11/2011 (15mm)  . BPH (benign prostatic hypertrophy)   . Bradycardia    Precluding BB use.  Marland Kitchen CAD (coronary artery disease)    a. s/p Taxus DES to RCA and PDA in 2006 .b. Myoview 8/11: Walked 12:37. EF 57%. no ischemia or scar. c. Mod CAD by cath 11/2011 (40-50% ISR of PDA -- for OP myoview to assess).  . Colon polyps   . Diverticulitis   . GERD (gastroesophageal reflux disease)   . Glaucoma   . Hyperlipidemia   . Hypertension   . Impaired fasting glucose   . Sleep apnea   . Syncope    Past Surgical History:  Procedure Laterality Date  . ANGIOPLASTY     percutaneous transluminal coronary  angioplasty and stenting of the right coronary artery and posterior   . CATARACT EXTRACTION W/ INTRAOCULAR LENS  IMPLANT, BILATERAL  9/15  . INTRAOCULAR LENS INSERTION    . LEFT HEART CATHETERIZATION WITH CORONARY ANGIOGRAM N/A 11/26/2011   Procedure: LEFT HEART CATHETERIZATION WITH CORONARY ANGIOGRAM;  Surgeon: Thayer Headings, MD;  Location: [facility] CATH LAB;  Service: Cardiovascular;  Laterality: N/A;  . PILONIDAL CYST / SINUS EXCISION     Family History  Problem Relation Age of Onset  . Coronary artery disease Father   . Heart disease Father    . Heart failure Father   . Cancer Mother     ovarian cancer  . Hypertension Neg Hx   . Diabetes Neg Hx    Social History  Substance Use Topics  . Smoking status: Former Smoker    Quit date: 05/13/1978  . Smokeless tobacco: Never Used  . Alcohol use Yes     Comment: wine nightly (2-3 glasses) if not working    Review of Systems  Constitutional: Negative for chills, fatigue and fever.  HENT: Negative for sore throat and trouble swallowing.   Respiratory: Negative for cough, chest tightness, shortness of breath and wheezing.   Cardiovascular: Negative for chest pain.  Gastrointestinal: Negative for diarrhea, nausea and vomiting.  Musculoskeletal: Negative for neck pain and neck stiffness.  Skin: Positive for rash.  Neurological: Negative for weakness, numbness and headaches.    Allergies  Lisinopril; Atorvastatin; Demerol [meperidine]; and Ezetimibe-simvastatin  Home Medications   Prior to Admission medications   Medication Sig Start Date End Date Taking? Authorizing Provider  amLODipine (NORVASC) 5 MG tablet TAKE 1 TABLET BY MOUTH DAILY 09/05/15  Yes Venia Carbon, MD  Ascorbic Acid (VITAMIN C) 1000 MG tablet Take 1,000 mg by mouth daily.     Yes Historical Provider, MD  aspirin 81 MG tablet Take 81 mg by mouth daily.  Yes Historical Provider, MD  Coenzyme Q10 (CO Q-10) 100 MG CAPS Take 1 capsule by mouth daily.    Yes Historical Provider, MD  fish oil-omega-3 fatty acids 1000 MG capsule Take 2 g by mouth daily.     Yes Historical Provider, MD  latanoprost (XALATAN) 0.005 % ophthalmic solution Place 1 drop into both eyes at bedtime.  01/24/14  Yes Historical Provider, MD  loratadine (CLARITIN) 10 MG tablet Take 10 mg by mouth daily as needed for allergies.    Yes Historical Provider, MD  rosuvastatin (CRESTOR) 20 MG tablet Take 0.5 tablets (10 mg total) by mouth daily. 10/16/15  Yes Thayer Headings, MD  triamcinolone cream (KENALOG) 0.1 % Apply 1 application topically 2 (two)  times daily. 03/11/13  Yes Venia Carbon, MD  valsartan-hydrochlorothiazide (DIOVAN-HCT) 320-25 MG tablet Take 1 tablet by mouth daily. 11/10/15  Yes Thayer Headings, MD  nitroGLYCERIN (NITROSTAT) 0.4 MG SL tablet Place 1 tablet (0.4 mg total) under the tongue every 5 (five) minutes x 3 doses as needed for chest pain. 11/08/15 02/07/17  Thayer Headings, MD  predniSONE (STERAPRED UNI-PAK 21 TAB) 10 MG (21) TBPK tablet Take 6 tabs by mouth on 1st day then 5 tabs on 2nd day then 4 tabs on 3rd day then 3 tablets on 4th day then 2 tablets on 5th day and then 1 tablet on 6th and final day. 03/16/16   Katy Apo, NP   Meds Ordered and Administered this Visit   Medications  methylPREDNISolone acetate (DEPO-MEDROL) injection 80 mg (80 mg Intramuscular Given 03/16/16 1616)    BP 146/63 (BP Location: Left Arm)   Pulse (!) 54   Temp 98 F (36.7 C) (Oral)   Resp 16   SpO2 96%  No data found.   Physical Exam  Constitutional: He is oriented to person, place, and time. He appears well-developed and well-nourished. No distress.  HENT:  Head: Normocephalic and atraumatic.    Mouth/Throat: Uvula is midline, oropharynx is clear and moist and mucous membranes are normal.  Scattered pink raised papular lesions present on back of neck, around left side of neck and left side of chin/face. One lesion on left upper eyelid. No discharge or crusting.   Neck: Normal range of motion. Neck supple.  Cardiovascular: Normal rate, regular rhythm and normal heart sounds.   Pulmonary/Chest: Effort normal and breath sounds normal. No respiratory distress. He has no wheezes. He has no rales.  Neurological: He is alert and oriented to person, place, and time. No sensory deficit.  Skin: Skin is warm and intact. Capillary refill takes less than 2 seconds. Rash noted. Rash is papular.     A few pink papular lesions present on right arm near elbow.   Psychiatric: He has a normal mood and affect. His behavior is normal.  Judgment and thought content normal.    Urgent Care Course   Clinical Course    Procedures (including critical care time)  Labs Review Labs Reviewed - No data to display  Imaging Review No results found.   Visual Acuity Review  Right Eye Distance:   Left Eye Distance:   Bilateral Distance:    Right Eye Near:   Left Eye Near:    Bilateral Near:         MDM   1. Rash and nonspecific skin eruption    DepoMedrol 80mg  given IM now. May start Prednisone 10mg  6 day dose pack as directed- starting in AM. May use Calamine  lotion for comfort and itching. Apply cool compresses to area for comfort. Wash all clothes and bedding in the past 3 days to minimize continued irritation. Follow-up with his primary care provider in 3 days or go to ER if symptoms worsen.      Katy Apo, NP 03/17/16 [phone]

## 2016-03-16 NOTE — ED Triage Notes (Signed)
Here for rash on face, neck, chest onset yest   Denies fevers, chills, new hygiene products/foods/meds  A&O 4... NAD

## 2016-03-19 ENCOUNTER — Telehealth: Payer: Self-pay

## 2016-03-19 ENCOUNTER — Other Ambulatory Visit: Payer: Self-pay | Admitting: Internal Medicine

## 2016-03-19 NOTE — Telephone Encounter (Signed)
Spoke to pt. He said he is doing better. Thinks it may have been dust mites

## 2016-04-02 ENCOUNTER — Encounter: Payer: Self-pay | Admitting: Cardiovascular Disease

## 2016-04-03 ENCOUNTER — Encounter: Payer: Self-pay | Admitting: Cardiovascular Disease

## 2016-04-08 DIAGNOSIS — Z86010 Personal history of colonic polyps: Secondary | ICD-10-CM | POA: Diagnosis not present

## 2016-04-08 DIAGNOSIS — K6289 Other specified diseases of anus and rectum: Secondary | ICD-10-CM | POA: Diagnosis not present

## 2016-04-12 ENCOUNTER — Other Ambulatory Visit: Payer: Medicare Other | Admitting: *Deleted

## 2016-04-12 DIAGNOSIS — I10 Essential (primary) hypertension: Secondary | ICD-10-CM

## 2016-04-12 DIAGNOSIS — E785 Hyperlipidemia, unspecified: Secondary | ICD-10-CM

## 2016-04-12 DIAGNOSIS — I2510 Atherosclerotic heart disease of native coronary artery without angina pectoris: Secondary | ICD-10-CM | POA: Diagnosis not present

## 2016-04-12 LAB — COMPREHENSIVE METABOLIC PANEL
ALBUMIN: 4.4 g/dL (ref 3.6–5.1)
ALT: 27 U/L (ref 9–46)
AST: 25 U/L (ref 10–35)
Alkaline Phosphatase: 85 U/L (ref 40–115)
BUN: 27 mg/dL — ABNORMAL HIGH (ref 7–25)
CHLORIDE: 102 mmol/L (ref 98–110)
CO2: 26 mmol/L (ref 20–31)
Calcium: 9.6 mg/dL (ref 8.6–10.3)
Creat: 1.26 mg/dL — ABNORMAL HIGH (ref 0.70–1.18)
Glucose, Bld: 106 mg/dL — ABNORMAL HIGH (ref 65–99)
POTASSIUM: 4.3 mmol/L (ref 3.5–5.3)
Sodium: 138 mmol/L (ref 135–146)
TOTAL PROTEIN: 6.9 g/dL (ref 6.1–8.1)
Total Bilirubin: 0.5 mg/dL (ref 0.2–1.2)

## 2016-04-12 LAB — LIPID PANEL
CHOL/HDL RATIO: 2.9 ratio (ref <5.0)
CHOLESTEROL: 147 mg/dL (ref <200)
HDL: 51 mg/dL (ref >40)
LDL Cholesterol: 81 mg/dL (ref <100)
TRIGLYCERIDES: 76 mg/dL (ref <150)
VLDL: 15 mg/dL (ref <30)

## 2016-04-15 ENCOUNTER — Encounter: Payer: Self-pay | Admitting: Cardiovascular Disease

## 2016-04-15 ENCOUNTER — Ambulatory Visit: Payer: Medicare Other | Admitting: Cardiovascular Disease

## 2016-04-15 ENCOUNTER — Ambulatory Visit (INDEPENDENT_AMBULATORY_CARE_PROVIDER_SITE_OTHER): Payer: Medicare Other | Admitting: Cardiovascular Disease

## 2016-04-15 VITALS — BP 150/70 | HR 64 | Ht 67.0 in | Wt 194.0 lb

## 2016-04-15 DIAGNOSIS — I10 Essential (primary) hypertension: Secondary | ICD-10-CM | POA: Diagnosis not present

## 2016-04-15 DIAGNOSIS — I2510 Atherosclerotic heart disease of native coronary artery without angina pectoris: Secondary | ICD-10-CM | POA: Insufficient documentation

## 2016-04-15 DIAGNOSIS — E782 Mixed hyperlipidemia: Secondary | ICD-10-CM | POA: Diagnosis not present

## 2016-04-15 NOTE — Progress Notes (Signed)
Patrick [name] Date of Birth  01/29/1941       [facility]    [employer] [address], [address], [place], [place]  [address]   [name], [name]  [address] [phone]     [phone]   Fax  [phone]    Fax [phone]  Problem List: 1. Coronary artery disease-moderate.  He's had a previous stent placed. 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 A subsequent Myoview study was normal. 2. Hypertension 3. Hyperlipidemia 4. Sleep apnea     Patrick [name] is doing very will.   He has been on a mission trip and to [employer].  No further episodes of chest pain.   He hauled 5 loads of hogs last week - his brother raises the hogs.  He is planning on going to [place] over Christmas.  He still does his lawn care business.  He walks at the [employer] park - 3 miles a day.  Without symptoms.  November 09, 2012:  Patrick [name] is doing well.  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   He is a retired Administrator.   Still walking 3 days a week.    Oct. 9, 2015:  Still very active - mows 8-9 yards a week.  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  No angina. BP is high. Has had a cold - is taking chlorpheneramine  Has had normal Bp at home   Sep 19, 2014:   Still very active.  Drives a truck.  Mows and trims 10 yards a week without any problems.  Has atypical  chest pain with mental stress but never with physical exertion.  Does not restrict his diet.   Nov. 18, 2016  Still working hard ( driving a truck and mowing yards)  Gets more short of breath compared to several years ago  Still eats some salty foods.   Has a lady who is cooking for him  - knows that she puts lots of salt in the food  October 16, 2015:  Patrick [name] is seen back for his CAD, HTN, HLD . OSA  Just got back from a cruise in [place] .  Still very active.   Mows yards, still drives trucks.     Dec. 4, 2017:  Patrick [name] is seen today with  [facility]. Still drives his truck and mows regularly .   No real CP or dyspnea.   Very rare episodes of chest tightness.  Keeps his BP at home.  Seems to go up when he comes to the doctor .    Current Outpatient Prescriptions on File Prior to Visit  Medication Sig Dispense Refill  . amLODipine (NORVASC) 5 MG tablet TAKE 1 TABLET BY MOUTH DAILY 90 tablet 3  . Ascorbic Acid (VITAMIN C) 1000 MG tablet Take 1,000 mg by mouth daily.      Marland Kitchen aspirin 81 MG tablet Take 81 mg by mouth daily.      . Coenzyme Q10 (CO Q-10) 100 MG CAPS Take 1 capsule by mouth daily.     . fish oil-omega-3 fatty acids 1000 MG capsule Take 2 g by mouth daily.      Marland Kitchen latanoprost (XALATAN) 0.005 % ophthalmic solution Place 1 drop into both eyes at bedtime.     . nitroGLYCERIN (NITROSTAT) 0.4 MG SL tablet Place 1 tablet (0.4 mg total) under the tongue every 5 (five) minutes x 3 doses  as needed for chest pain. 25 tablet 6  . rosuvastatin (CRESTOR) 20 MG tablet Take 0.5 tablets (10 mg total) by mouth daily. 45 tablet 3  . triamcinolone cream (KENALOG) 0.1 % Apply 1 application topically 2 (two) times daily. 60 g 1  . valsartan-hydrochlorothiazide (DIOVAN-HCT) 320-25 MG tablet Take 1 tablet by mouth daily. 90 tablet 3   No current facility-administered medications on file prior to visit.     Allergies  Allergen Reactions  . Lisinopril     cough  . Atorvastatin     REACTION: leg cramps  . Demerol [Meperidine]     No relief reported  . Ezetimibe-Simvastatin     REACTION: cramps    Past Medical History:  Diagnosis Date  . Allergy   . Aortic root dilatation (HCC)    Mild by echo 11/2011 (18mm)  . BPH (benign prostatic hypertrophy)   . Bradycardia    Precluding BB use.  Marland Kitchen CAD (coronary artery disease)    a. s/p Taxus DES to RCA and PDA in 2006 .b. Myoview 8/11: Walked 12:37. EF 57%. no ischemia or scar. c. Mod CAD by cath 11/2011 (40-50% ISR of PDA -- for OP myoview to assess).  . Colon polyps   . Diverticulitis   .  GERD (gastroesophageal reflux disease)   . Glaucoma   . Hyperlipidemia   . Hypertension   . Impaired fasting glucose   . Sleep apnea   . Syncope     Past Surgical History:  Procedure Laterality Date  . ANGIOPLASTY     percutaneous transluminal coronary  angioplasty and stenting of the right coronary artery and posterior   . CATARACT EXTRACTION W/ INTRAOCULAR LENS  IMPLANT, BILATERAL  9/15  . INTRAOCULAR LENS INSERTION    . LEFT HEART CATHETERIZATION WITH CORONARY ANGIOGRAM N/A 11/26/2011   Procedure: LEFT HEART CATHETERIZATION WITH CORONARY ANGIOGRAM;  Surgeon: Thayer Headings, MD;  Location: [facility] CATH LAB;  Service: Cardiovascular;  Laterality: N/A;  . PILONIDAL CYST / SINUS EXCISION      History  Smoking Status  . Former Smoker  . Quit date: 05/13/1978  Smokeless Tobacco  . Never Used    History  Alcohol Use  . Yes    Comment: wine nightly (2-3 glasses) if not working    Family History  Problem Relation Age of Onset  . Coronary artery disease Father   . Heart disease Father   . Heart failure Father   . Cancer Mother     ovarian cancer  . Hypertension Neg Hx   . Diabetes Neg Hx     Reviw of Systems:  Reviewed in the HPI.  All other systems are negative.  Physical Exam: Blood pressure (!) 150/70, pulse 64, height 5\' 7"  (1.702 m), weight 194 lb (88 kg). General: Well developed, well nourished, in no acute distress.  Head: Normocephalic, atraumatic, sclera non-icteric, mucus membranes are moist,   Neck: Supple. Carotids are 2 + without bruits. No JVD   Lungs: Clear   Heart: RR, normal S1 ,S2  Abdomen: Soft, non-tender, non-distended with normal bowel sounds.  Msk:  Strength and tone are normal   Extremities: No clubbing or cyanosis. No edema.  Distal pedal pulses are 2+ and equal    Neuro: CN II - XII intact.  Alert and oriented X 3.   Psych:  Normal   ECG: Sinus bradycardia 55 beats a minute. PACs .   He has no ST or T wave changes.   Assessment /  Plan:   1. Coronary artery disease-moderate.  He's had a previous stent placed. 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 A subsequent Myoview study was normal. He's not had any further episodes of angina. Continue current medications. We'll check fasting labs today. I'll see him again in 6 months .   2. Hypertension- BP is on the higher side.  Still eats lots of salt.   Encouraged him to decrease his salt intake   3. Hyperlipidemia-  He is tolerating the Crestor . Labs are good.   LDL is 81.   Recheck in  6 months   4. Sleep apnea   Mertie Moores, MD  04/15/2016 9:16 AM    [address],  [address] [place], [place]  [address] Pager [phone] Phone: [phone]; Fax: [phone]

## 2016-04-15 NOTE — Patient Instructions (Signed)

## 2016-04-16 DIAGNOSIS — H401112 Primary open-angle glaucoma, right eye, moderate stage: Secondary | ICD-10-CM | POA: Diagnosis not present

## 2016-04-16 DIAGNOSIS — H401122 Primary open-angle glaucoma, left eye, moderate stage: Secondary | ICD-10-CM | POA: Diagnosis not present

## 2016-04-16 DIAGNOSIS — H43813 Vitreous degeneration, bilateral: Secondary | ICD-10-CM | POA: Diagnosis not present

## 2016-04-16 DIAGNOSIS — Z961 Presence of intraocular lens: Secondary | ICD-10-CM | POA: Diagnosis not present

## 2016-04-19 DIAGNOSIS — L570 Actinic keratosis: Secondary | ICD-10-CM | POA: Diagnosis not present

## 2016-04-19 DIAGNOSIS — L820 Inflamed seborrheic keratosis: Secondary | ICD-10-CM | POA: Diagnosis not present

## 2016-04-24 DIAGNOSIS — D126 Benign neoplasm of colon, unspecified: Secondary | ICD-10-CM | POA: Diagnosis not present

## 2016-04-24 DIAGNOSIS — K5730 Diverticulosis of large intestine without perforation or abscess without bleeding: Secondary | ICD-10-CM | POA: Diagnosis not present

## 2016-04-24 DIAGNOSIS — D123 Benign neoplasm of transverse colon: Secondary | ICD-10-CM | POA: Diagnosis not present

## 2016-04-24 DIAGNOSIS — Z86010 Personal history of colonic polyps: Secondary | ICD-10-CM | POA: Diagnosis not present

## 2016-04-24 LAB — HM COLONOSCOPY

## 2016-04-25 ENCOUNTER — Ambulatory Visit (INDEPENDENT_AMBULATORY_CARE_PROVIDER_SITE_OTHER): Payer: Medicare Other | Admitting: Family Medicine

## 2016-04-25 ENCOUNTER — Encounter: Payer: Self-pay | Admitting: Family Medicine

## 2016-04-25 VITALS — BP 138/80 | HR 61 | Temp 97.7°F | Wt 189.0 lb

## 2016-04-25 DIAGNOSIS — M79662 Pain in left lower leg: Secondary | ICD-10-CM | POA: Diagnosis not present

## 2016-04-25 DIAGNOSIS — S39012A Strain of muscle, fascia and tendon of lower back, initial encounter: Secondary | ICD-10-CM

## 2016-04-25 MED ORDER — MELOXICAM 7.5 MG PO TABS: 7.5000 mg | ORAL_TABLET | Freq: Every day | ORAL | 0 refills | Status: DC | PRN

## 2016-04-25 MED ORDER — CYCLOBENZAPRINE HCL 10 MG PO TABS: 10.0000 mg | ORAL_TABLET | Freq: Every evening | ORAL | 0 refills | Status: DC | PRN

## 2016-04-25 NOTE — Progress Notes (Signed)
Subjective:    Patient ID: Patrick [name], male    DOB: 08-09-1940, 75 y.o.   MRN: [id number]  HPI This is a 75 yo male who presents today with pain in left buttock and left calf. He fell about two weeks ago while going down a truck ramp. Every day has gotten worse. Pain is constant, line of pain down leg. Couldn't get comfortable last night. No back or neck pain, no weakness. No numbness or tingling. Left calf feels crampy. Hot shower and biofreeze with temporary relief. No further falls. Pain worse in the morning. Didn't sleep well last night. Admits to limiting movement and walking gingerly.   Had colonoscopy yesterday, Dr. Watt Climes, one small polyp. Tolerated procedure well. Felt better when he was up and down more to go to bathroom for prep.   Past Medical History:  Diagnosis Date  . Allergy   . Aortic root dilatation (HCC)    Mild by echo 11/2011 (86mm)  . BPH (benign prostatic hypertrophy)   . Bradycardia    Precluding BB use.  Marland Kitchen CAD (coronary artery disease)    a. s/p Taxus DES to RCA and PDA in 2006 .b. Myoview 8/11: Walked 12:37. EF 57%. no ischemia or scar. c. Mod CAD by cath 11/2011 (40-50% ISR of PDA -- for OP myoview to assess).  . Colon polyps   . Diverticulitis   . GERD (gastroesophageal reflux disease)   . Glaucoma   . Hyperlipidemia   . Hypertension   . Impaired fasting glucose   . Sleep apnea   . Syncope    Past Surgical History:  Procedure Laterality Date  . ANGIOPLASTY     percutaneous transluminal coronary  angioplasty and stenting of the right coronary artery and posterior   . CATARACT EXTRACTION W/ INTRAOCULAR LENS  IMPLANT, BILATERAL  9/15  . INTRAOCULAR LENS INSERTION    . LEFT HEART CATHETERIZATION WITH CORONARY ANGIOGRAM N/A 11/26/2011   Procedure: LEFT HEART CATHETERIZATION WITH CORONARY ANGIOGRAM;  Surgeon: Thayer Headings, MD;  Location: [facility] CATH LAB;  Service: Cardiovascular;  Laterality: N/A;  . PILONIDAL CYST / SINUS EXCISION     Family History    Problem Relation Age of Onset  . Coronary artery disease Father   . Heart disease Father   . Heart failure Father   . Cancer Mother     ovarian cancer  . Hypertension Neg Hx   . Diabetes Neg Hx    Social History  Substance Use Topics  . Smoking status: Former Smoker    Quit date: 05/13/1978  . Smokeless tobacco: Never Used  . Alcohol use Yes     Comment: wine nightly (2-3 glasses) if not working      Review of Systems Per HPI    Objective:   Physical Exam  Constitutional: He is oriented to person, place, and time. He appears well-developed and well-nourished. No distress.  HENT:  Head: Normocephalic and atraumatic.  Eyes: Conjunctivae are normal.  Cardiovascular: Normal rate.   Pulmonary/Chest: Effort normal.  Musculoskeletal:       Left hip: He exhibits normal range of motion, normal strength, no tenderness, no bony tenderness and no deformity.       Lumbar back: He exhibits normal range of motion, no tenderness, no bony tenderness and no swelling.       Left lower leg: He exhibits tenderness (along lateral aspect slightly tight, no pain with flexion of foot, no erythema. ). He exhibits no swelling and no edema.  Neurological: He is alert and oriented to person, place, and time.  Skin: Skin is warm and dry. He is not diaphoretic.  Psychiatric: He has a normal mood and affect. His behavior is normal. Judgment and thought content normal.  Vitals reviewed.     BP 138/80 (BP Location: Left Arm, Patient Position: Sitting, Cuff Size: Large)   Pulse 61   Temp 97.7 F (36.5 C) (Oral)   Wt 189 lb (85.7 kg)   SpO2 94%   BMI 29.60 kg/m  Wt Readings from Last 3 Encounters:  04/25/16 189 lb (85.7 kg)  04/15/16 194 lb (88 kg)  02/27/16 195 lb (88.5 kg)   BP Readings from Last 3 Encounters:  04/25/16 138/80  04/15/16 (!) 150/70  03/16/16 146/63        Assessment & Plan:  1. Strain of lumbar region, initial encounter - no worrisome findings on exam, encouraged  patient to avoid limiting movement, continue heat prn - meloxicam (MOBIC) 7.5 MG tablet; Take 1 tablet (7.5 mg total) by mouth daily as needed for pain.  Dispense: 20 tablet; Refill: 0 - cyclobenzaprine (FLEXERIL) 10 MG tablet; Take 1 tablet (10 mg total) by mouth at bedtime as needed for muscle spasms.  Dispense: 30 tablet; Refill: 0 - RTC if no improvement in 5-7 days or if symptoms worsen  2. Pain of left calf - meloxicam (MOBIC) 7.5 MG tablet; Take 1 tablet (7.5 mg total) by mouth daily as needed for pain.  Dispense: 20 tablet; Refill: 0   Clarene Reamer, FNP-BC  [facility]  04/25/2016 11:11 AM

## 2016-04-25 NOTE — Patient Instructions (Signed)
I have sent two prescriptions to your pharmacy- an anti- inflammatory medicine to take once a day and a muscle relaxer to take at bed time Stretch the area at least twice a day, continue to use heat Please let us know if you are not better in 5-7 days

## 2016-04-25 NOTE — Progress Notes (Signed)
Pre visit review using our clinic review tool, if applicable. No additional management support is needed unless otherwise documented below in the visit note. 

## 2016-04-30 DIAGNOSIS — D126 Benign neoplasm of colon, unspecified: Secondary | ICD-10-CM | POA: Diagnosis not present

## 2016-05-03 ENCOUNTER — Encounter: Payer: Self-pay | Admitting: Internal Medicine

## 2016-05-09 ENCOUNTER — Encounter: Payer: Self-pay | Admitting: Internal Medicine

## 2016-05-09 ENCOUNTER — Ambulatory Visit (INDEPENDENT_AMBULATORY_CARE_PROVIDER_SITE_OTHER): Payer: Medicare Other | Admitting: Internal Medicine

## 2016-05-09 VITALS — BP 128/82 | HR 65 | Temp 97.3°F | Wt 190.0 lb

## 2016-05-09 DIAGNOSIS — M5432 Sciatica, left side: Secondary | ICD-10-CM

## 2016-05-09 DIAGNOSIS — S39012A Strain of muscle, fascia and tendon of lower back, initial encounter: Secondary | ICD-10-CM | POA: Diagnosis not present

## 2016-05-09 DIAGNOSIS — M79662 Pain in left lower leg: Secondary | ICD-10-CM | POA: Diagnosis not present

## 2016-05-09 MED ORDER — PREDNISONE 20 MG PO TABS: 40.0000 mg | ORAL_TABLET | Freq: Every day | ORAL | 0 refills | Status: DC

## 2016-05-09 MED ORDER — MELOXICAM 7.5 MG PO TABS: 7.5000 mg | ORAL_TABLET | Freq: Every day | ORAL | 0 refills | Status: DC | PRN

## 2016-05-09 MED ORDER — CYCLOBENZAPRINE HCL 10 MG PO TABS: 5.0000 mg | ORAL_TABLET | Freq: Every evening | ORAL | 0 refills | Status: DC | PRN

## 2016-05-09 NOTE — Progress Notes (Signed)
Pre visit review using our clinic review tool, if applicable. No additional management support is needed unless otherwise documented below in the visit note. 

## 2016-05-09 NOTE — Assessment & Plan Note (Signed)
Persisting for a month Will try short prednisone course Increase meloxicam to 7.5 bid for the next 2 weeks Cyclobenzaprine at bedtime for a brief time also

## 2016-05-09 NOTE — Patient Instructions (Signed)
If you are not better within 2 weeks, let me know and I will set you up with a sports medicine specialist (or orthopedist).

## 2016-05-09 NOTE — Progress Notes (Signed)
Subjective:    Patient ID: Patrick [name], male    DOB: 07/12/1940, 75 y.o.   MRN: [id number]  HPI Here due to ongoing left leg pain Reviewed last note  Was helping daughter move Picked up ~40# box and stepped off trailer on 11/30---slipped on piece of vinyl and legs split Fell onto right elbow Noticed bad pain along lateral thigh and to mid calf 2 days later This has persisted Not really painful in back Nephew does acupuncture- helped the first time, but not after  No leg weakness Calf no longer hard The pain is in the lower buttock now and below the knee only Sometimes feels better when walking around  Uses biofreeze and hot showers--- seems to help temporarily No leg swelling  Current Outpatient Prescriptions on File Prior to Visit  Medication Sig Dispense Refill  . amLODipine (NORVASC) 5 MG tablet TAKE 1 TABLET BY MOUTH DAILY 90 tablet 3  . Ascorbic Acid (VITAMIN C) 1000 MG tablet Take 1,000 mg by mouth daily.      Marland Kitchen aspirin 81 MG tablet Take 81 mg by mouth daily.      . Coenzyme Q10 (CO Q-10) 100 MG CAPS Take 1 capsule by mouth daily.     . cyclobenzaprine (FLEXERIL) 10 MG tablet Take 1 tablet (10 mg total) by mouth at bedtime as needed for muscle spasms. 30 tablet 0  . fish oil-omega-3 fatty acids 1000 MG capsule Take 2 g by mouth daily.      Marland Kitchen latanoprost (XALATAN) 0.005 % ophthalmic solution Place 1 drop into both eyes at bedtime.     . meloxicam (MOBIC) 7.5 MG tablet Take 1 tablet (7.5 mg total) by mouth daily as needed for pain. 20 tablet 0  . nitroGLYCERIN (NITROSTAT) 0.4 MG SL tablet Place 1 tablet (0.4 mg total) under the tongue every 5 (five) minutes x 3 doses as needed for chest pain. 25 tablet 6  . rosuvastatin (CRESTOR) 20 MG tablet Take 0.5 tablets (10 mg total) by mouth daily. 45 tablet 3  . triamcinolone cream (KENALOG) 0.1 % Apply 1 application topically 2 (two) times daily. 60 g 1  . valsartan-hydrochlorothiazide (DIOVAN-HCT) 320-25 MG tablet Take 1 tablet  by mouth daily. 90 tablet 3   No current facility-administered medications on file prior to visit.     Allergies  Allergen Reactions  . Lisinopril     cough  . Atorvastatin     REACTION: leg cramps  . Demerol [Meperidine]     No relief reported  . Ezetimibe-Simvastatin     REACTION: cramps    Past Medical History:  Diagnosis Date  . Allergy   . Aortic root dilatation (HCC)    Mild by echo 11/2011 (5mm)  . BPH (benign prostatic hypertrophy)   . Bradycardia    Precluding BB use.  Marland Kitchen CAD (coronary artery disease)    a. s/p Taxus DES to RCA and PDA in 2006 .b. Myoview 8/11: Walked 12:37. EF 57%. no ischemia or scar. c. Mod CAD by cath 11/2011 (40-50% ISR of PDA -- for OP myoview to assess).  . Colon polyps   . Diverticulitis   . GERD (gastroesophageal reflux disease)   . Glaucoma   . Hyperlipidemia   . Hypertension   . Impaired fasting glucose   . Sleep apnea   . Syncope     Past Surgical History:  Procedure Laterality Date  . ANGIOPLASTY     percutaneous transluminal coronary  angioplasty and stenting of  the right coronary artery and posterior   . CATARACT EXTRACTION W/ INTRAOCULAR LENS  IMPLANT, BILATERAL  9/15  . INTRAOCULAR LENS INSERTION    . LEFT HEART CATHETERIZATION WITH CORONARY ANGIOGRAM N/A 11/26/2011   Procedure: LEFT HEART CATHETERIZATION WITH CORONARY ANGIOGRAM;  Surgeon: Thayer Headings, MD;  Location: [facility] CATH LAB;  Service: Cardiovascular;  Laterality: N/A;  . PILONIDAL CYST / SINUS EXCISION      Family History  Problem Relation Age of Onset  . Coronary artery disease Father   . Heart disease Father   . Heart failure Father   . Cancer Mother     ovarian cancer  . Hypertension Neg Hx   . Diabetes Neg Hx     Social History   Social History  . Marital status: Divorced    Spouse name: N/A  . Number of children: 3  . Years of education: N/A   Occupational History  . driver part time Retired   Social History Main Topics  . Smoking status:  Former Smoker    Quit date: 05/13/1978  . Smokeless tobacco: Never Used  . Alcohol use Yes     Comment: wine nightly (2-3 glasses) if not working  . Drug use: No  . Sexual activity: Yes   Other Topics Concern  . Not on file   Social History Narrative   No living will   Daughter Benjamine Mola should make medical decisions if he can't   Would accept resuscitation   Wouldn't want prolonged tube feeds if cognitively unaware   Review of Systems  No fever Feels okay Caught up with trucking work--so hasn't done this (other than once) since the injury Only sleeps well if he takes both meloxicam and cyclobenzaprine    Objective:   Physical Exam  Musculoskeletal:  Slight tenderness along lower left buttock No spine tenderness SLR negative Normal ROM of hip  Neurological:  Normal gait and strength          Assessment & Plan:

## 2016-05-16 ENCOUNTER — Telehealth: Payer: Self-pay | Admitting: Internal Medicine

## 2016-05-16 DIAGNOSIS — M5432 Sciatica, left side: Secondary | ICD-10-CM

## 2016-05-16 NOTE — Telephone Encounter (Signed)
Pt returned your call, please call [phone] Thanks

## 2016-05-16 NOTE — Telephone Encounter (Signed)
Spoke to pt. He was not actually returning my call, but advising me and Dr Silvio Pate that his calf pain has gotten worse instead of better and is ready to be referred to a specialist. Pt said if he needs an orthopedist, he would like to see Dr Melrose Nakayama at [facility]. His wife has seen Dr Rhona Raider.

## 2016-05-17 ENCOUNTER — Ambulatory Visit (INDEPENDENT_AMBULATORY_CARE_PROVIDER_SITE_OTHER): Payer: Medicare Other | Admitting: Internal Medicine

## 2016-05-17 ENCOUNTER — Encounter: Payer: Self-pay | Admitting: Internal Medicine

## 2016-05-17 VITALS — BP 146/74 | Temp 98.0°F | Wt 195.0 lb

## 2016-05-17 DIAGNOSIS — M79605 Pain in left leg: Secondary | ICD-10-CM | POA: Diagnosis not present

## 2016-05-17 DIAGNOSIS — M5432 Sciatica, left side: Secondary | ICD-10-CM

## 2016-05-17 MED ORDER — KETOROLAC TROMETHAMINE 60 MG/2ML IM SOLN
60.0000 mg | Freq: Once | INTRAMUSCULAR | Status: AC
  Administered 2016-05-17: 60 mg via INTRAMUSCULAR

## 2016-05-17 MED ORDER — GABAPENTIN 100 MG PO CAPS: 100.0000 mg | ORAL_CAPSULE | Freq: Three times a day (TID) | ORAL | 1 refills | Status: DC

## 2016-05-17 NOTE — Progress Notes (Signed)
Pre visit review using our clinic review tool, if applicable. No additional management support is needed unless otherwise documented below in the visit note.  Chief Complaint  Patient presents with  . Left Side Sciatica    Pain is worse.    HPI: Patrick [name] 76 y.o.  Patient comes in for SDA seen team health. Fell unusual fall with legs spread in November. Has had pain since that time it is getting worse left buttocks and left lateral calf pain that has become so severe he compares it to renal stone pain. It is constant although moving certain ways makes it worse. He has seen his PCP treated with anti-inflammatories and more recently steroids and prednisone with some minimal help it is worse recently and he has an appointment next week with orthopedics. No x-rays have been done.  Had used acupuncture and massage  In past  And some help. To keep the pain tolerable. He is here with his wife today who is driving. ROS: See pertinent positives and negatives per HPI. No fever urinary changes bleeding weakness in his leg.  Past Medical History:  Diagnosis Date  . Allergy   . Aortic root dilatation (HCC)    Mild by echo 11/2011 (5mm)  . BPH (benign prostatic hypertrophy)   . Bradycardia    Precluding BB use.  Marland Kitchen CAD (coronary artery disease)    a. s/p Taxus DES to RCA and PDA in 2006 .b. Myoview 8/11: Walked 12:37. EF 57%. no ischemia or scar. c. Mod CAD by cath 11/2011 (40-50% ISR of PDA -- for OP myoview to assess).  . Colon polyps   . Diverticulitis   . GERD (gastroesophageal reflux disease)   . Glaucoma   . Hyperlipidemia   . Hypertension   . Impaired fasting glucose   . Sleep apnea   . Syncope     Family History  Problem Relation Age of Onset  . Coronary artery disease Father   . Heart disease Father   . Heart failure Father   . Cancer Mother     ovarian cancer  . Hypertension Neg Hx   . Diabetes Neg Hx     Social History   Social History  . Marital status: Divorced     Spouse name: N/A  . Number of children: 3  . Years of education: N/A   Occupational History  . driver part time Retired   Social History Main Topics  . Smoking status: Former Smoker    Quit date: 05/13/1978  . Smokeless tobacco: Never Used  . Alcohol use Yes     Comment: wine nightly (2-3 glasses) if not working  . Drug use: No  . Sexual activity: Yes   Other Topics Concern  . None   Social History Narrative   No living will   Daughter Benjamine Mola should make medical decisions if he can't   Would accept resuscitation   Wouldn't want prolonged tube feeds if cognitively unaware    Outpatient Medications Prior to Visit  Medication Sig Dispense Refill  . amLODipine (NORVASC) 5 MG tablet TAKE 1 TABLET BY MOUTH DAILY 90 tablet 3  . Ascorbic Acid (VITAMIN C) 1000 MG tablet Take 1,000 mg by mouth daily.      Marland Kitchen aspirin 81 MG tablet Take 81 mg by mouth daily.      . Coenzyme Q10 (CO Q-10) 100 MG CAPS Take 1 capsule by mouth daily.     . cyclobenzaprine (FLEXERIL) 10 MG tablet Take 0.5-1 tablets (  5-10 mg total) by mouth at bedtime as needed for muscle spasms. 20 tablet 0  . fish oil-omega-3 fatty acids 1000 MG capsule Take 2 g by mouth daily.      Marland Kitchen latanoprost (XALATAN) 0.005 % ophthalmic solution Place 1 drop into both eyes at bedtime.     . meloxicam (MOBIC) 7.5 MG tablet Take 1 tablet (7.5 mg total) by mouth daily as needed for pain. 30 tablet 0  . nitroGLYCERIN (NITROSTAT) 0.4 MG SL tablet Place 1 tablet (0.4 mg total) under the tongue every 5 (five) minutes x 3 doses as needed for chest pain. 25 tablet 6  . rosuvastatin (CRESTOR) 20 MG tablet Take 0.5 tablets (10 mg total) by mouth daily. 45 tablet 3  . triamcinolone cream (KENALOG) 0.1 % Apply 1 application topically 2 (two) times daily. 60 g 1  . valsartan-hydrochlorothiazide (DIOVAN-HCT) 320-25 MG tablet Take 1 tablet by mouth daily. 90 tablet 3  . predniSONE (DELTASONE) 20 MG tablet Take 2 tablets (40 mg total) by mouth daily.  For 3 days, then 1 tab daily for 3 days 9 tablet 0   No facility-administered medications prior to visit.      EXAM:  BP (!) 146/74 (BP Location: Right Arm, Patient Position: Sitting, Cuff Size: Large)   Temp 98 F (36.7 C) (Oral)   Wt 195 lb (88.5 kg)   BMI 30.54 kg/m   Body mass index is 30.54 kg/m.  GENERAL: vitals reviewed and listed above, alert, oriented, appears well hydrated and in no acute distressAppears comfortable but complaining of severe pain in his left lateral leg. His gait is almost normal no midline bony tenderness. Some pain when he bends over or tries to lift his leg. No unusual rash. Neurologic appears to be grossly nonfocal. HEENT: atraumatic, conjunctiva  clear, no obvious abnormalities on inspection of external nose and ears NECK: no obvious masses on inspection palpation   CV: HRRR, no clubbing cyanosis or  peripheral edema nl cap refill  MS: moves all extremities without noticeable focal  abnormality PSYCH: pleasant and cooperative, no obvious depression or anxiety  ASSESSMENT AND PLAN:  Discussed the following assessment and plan:  Left sided sciatica - Plan: ketorolac (TORADOL) injection 60 mg  Leg pain, left Severe left leg pain onset after a fall almost 2 months ago. Progressing symptoms unresponsive to anti-inflammatories prednisone although mild help with acupuncture. He comes in today he is not in the office because of increased severity of pain. I see no alarm symptoms today keep his appointment on Monday with the orthopedist. Shot of Toradol given with explanation and a trial of gabapentin low-dose increasing from 100-300 mg max 3 times a day. Caution with sedation. -Patient advised to return or notify health care team  if symptoms worsen ,persist or new concerns arise. Total visit 85mins > 50% spent counseling  medcation   Expectant management. Alarm sx and coordinating care as indicated in above note and in instructions to patient .      Patient Instructions  Injection of  Toradol antiinflammatory for acute pain.   Begin  Trial gabapentin  100 mg  Increase toi 300 mg every 8 hours   Can cause grogginess when staring but has been used for nerve pain  Help.   Keep appt with [facility] Monday  As planned  Sorry you are feeling badly   Acupuncture can be helpful at times.     Patrick [name]. Patrick [name] M.D.

## 2016-05-17 NOTE — Patient Instructions (Signed)
Injection of  Toradol antiinflammatory for acute pain.   Begin  Trial gabapentin  100 mg  Increase toi 300 mg every 8 hours   Can cause grogginess when staring but has been used for nerve pain  Help.   Keep appt with [facility] Monday  As planned  Sorry you are feeling badly   Acupuncture can be helpful at times.

## 2016-05-17 NOTE — Telephone Encounter (Signed)
Pt called [facility] and has appt for 05/21/15. Pt said pain in lt buttock and lt leg has worsened since seen 05/09/16. Dr Silvio Pate out of office until 05/21/16. Pt said med was helping pain but now does not help pain at all. Pt request appt. Pt to see Dr Regis Bill 05/17/16 at 11:15 for 30 min appt.

## 2016-05-20 ENCOUNTER — Emergency Department (HOSPITAL_COMMUNITY): Payer: Medicare Other

## 2016-05-20 ENCOUNTER — Encounter (HOSPITAL_COMMUNITY): Payer: Self-pay

## 2016-05-20 ENCOUNTER — Emergency Department (HOSPITAL_COMMUNITY)
Admission: EM | Admit: 2016-05-20 | Discharge: 2016-05-20 | Disposition: A | Discharge status: Home / Self Care | Payer: Medicare Other | Attending: Emergency Medicine | Admitting: Emergency Medicine

## 2016-05-20 DIAGNOSIS — Y9389 Activity, other specified: Secondary | ICD-10-CM | POA: Insufficient documentation

## 2016-05-20 DIAGNOSIS — Z7982 Long term (current) use of aspirin: Secondary | ICD-10-CM | POA: Insufficient documentation

## 2016-05-20 DIAGNOSIS — Z87891 Personal history of nicotine dependence: Secondary | ICD-10-CM | POA: Diagnosis not present

## 2016-05-20 DIAGNOSIS — R000 Tachycardia, unspecified: Secondary | ICD-10-CM | POA: Diagnosis not present

## 2016-05-20 DIAGNOSIS — I10 Essential (primary) hypertension: Secondary | ICD-10-CM | POA: Diagnosis not present

## 2016-05-20 DIAGNOSIS — W1830XA Fall on same level, unspecified, initial encounter: Secondary | ICD-10-CM | POA: Diagnosis not present

## 2016-05-20 DIAGNOSIS — I2510 Atherosclerotic heart disease of native coronary artery without angina pectoris: Secondary | ICD-10-CM | POA: Insufficient documentation

## 2016-05-20 DIAGNOSIS — Y939 Activity, unspecified: Secondary | ICD-10-CM | POA: Diagnosis not present

## 2016-05-20 DIAGNOSIS — Y929 Unspecified place or not applicable: Secondary | ICD-10-CM | POA: Insufficient documentation

## 2016-05-20 DIAGNOSIS — S3992XA Unspecified injury of lower back, initial encounter: Secondary | ICD-10-CM | POA: Diagnosis not present

## 2016-05-20 DIAGNOSIS — R05 Cough: Secondary | ICD-10-CM | POA: Diagnosis not present

## 2016-05-20 DIAGNOSIS — M545 Low back pain: Secondary | ICD-10-CM | POA: Insufficient documentation

## 2016-05-20 DIAGNOSIS — M5489 Other dorsalgia: Secondary | ICD-10-CM | POA: Diagnosis not present

## 2016-05-20 LAB — COMPREHENSIVE METABOLIC PANEL
ALT: 36 U/L (ref 17–63)
AST: 26 U/L (ref 15–41)
Albumin: 4.1 g/dL (ref 3.5–5.0)
Alkaline Phosphatase: 79 U/L (ref 38–126)
Anion gap: 11 (ref 5–15)
BUN: 24 mg/dL — ABNORMAL HIGH (ref 6–20)
CHLORIDE: 101 mmol/L (ref 101–111)
CO2: 23 mmol/L (ref 22–32)
CREATININE: 1.5 mg/dL — AB (ref 0.61–1.24)
Calcium: 9.9 mg/dL (ref 8.9–10.3)
GFR calc non Af Amer: 44 mL/min — ABNORMAL LOW (ref >60)
GFR, EST AFRICAN AMERICAN: 51 mL/min — AB (ref >60)
Glucose, Bld: 106 mg/dL — ABNORMAL HIGH (ref 65–99)
Potassium: 4.5 mmol/L (ref 3.5–5.1)
SODIUM: 135 mmol/L (ref 135–145)
Total Bilirubin: 1 mg/dL (ref 0.3–1.2)
Total Protein: 6.9 g/dL (ref 6.5–8.1)

## 2016-05-20 LAB — CBC WITH DIFFERENTIAL/PLATELET
BASOS PCT: 0 %
Basophils Absolute: 0 10*3/uL (ref 0.0–0.1)
EOS ABS: 0.2 10*3/uL (ref 0.0–0.7)
EOS PCT: 1 %
HCT: 41 % (ref 39.0–52.0)
Hemoglobin: 14.4 g/dL (ref 13.0–17.0)
Lymphocytes Relative: 5 %
Lymphs Abs: 1.1 10*3/uL (ref 0.7–4.0)
MCH: 31.6 pg (ref 26.0–34.0)
MCHC: 35.1 g/dL (ref 30.0–36.0)
MCV: 90.1 fL (ref 78.0–100.0)
Monocytes Absolute: 1.6 10*3/uL — ABNORMAL HIGH (ref 0.1–1.0)
Monocytes Relative: 6 %
Neutro Abs: 21.7 10*3/uL — ABNORMAL HIGH (ref 1.7–7.7)
Neutrophils Relative %: 88 %
PLATELETS: 256 10*3/uL (ref 150–400)
RBC: 4.55 MIL/uL (ref 4.22–5.81)
RDW: 13.9 % (ref 11.5–15.5)
WBC: 24.6 10*3/uL — AB (ref 4.0–10.5)

## 2016-05-20 LAB — URINALYSIS, ROUTINE W REFLEX MICROSCOPIC
Glucose, UA: NEGATIVE mg/dL
Hgb urine dipstick: NEGATIVE
KETONES UR: NEGATIVE mg/dL
NITRITE: NEGATIVE
PROTEIN: 30 mg/dL — AB
Specific Gravity, Urine: 1.025 (ref 1.005–1.030)
pH: 6 (ref 5.0–8.0)

## 2016-05-20 LAB — URINALYSIS, MICROSCOPIC (REFLEX)

## 2016-05-20 LAB — LIPASE, BLOOD: LIPASE: 13 U/L (ref 11–51)

## 2016-05-20 LAB — I-STAT CG4 LACTIC ACID, ED: LACTIC ACID, VENOUS: 1.94 mmol/L — AB (ref 0.5–1.9)

## 2016-05-20 MED ORDER — OXYCODONE-ACETAMINOPHEN 5-325 MG PO TABS
1.0000 | ORAL_TABLET | ORAL | Status: AC | PRN
  Administered 2016-05-20 (×2): 1 via ORAL
  Filled 2016-05-20: qty 1

## 2016-05-20 MED ORDER — HYDROMORPHONE HCL 2 MG/ML IJ SOLN
1.0000 mg | Freq: Once | INTRAMUSCULAR | Status: AC
  Administered 2016-05-20: 1 mg via INTRAVENOUS
  Filled 2016-05-20: qty 1

## 2016-05-20 MED ORDER — OXYCODONE-ACETAMINOPHEN 5-325 MG PO TABS
ORAL_TABLET | ORAL | Status: AC
  Filled 2016-05-20: qty 1

## 2016-05-20 MED ORDER — HYDROMORPHONE HCL 2 MG/ML IJ SOLN
0.5000 mg | Freq: Once | INTRAMUSCULAR | Status: AC
  Administered 2016-05-20: 0.5 mg via INTRAVENOUS
  Filled 2016-05-20: qty 1

## 2016-05-20 MED ORDER — HYDROMORPHONE HCL 2 MG PO TABS: 2.0000 mg | ORAL_TABLET | ORAL | 0 refills | Status: DC | PRN

## 2016-05-20 MED ORDER — OXYCODONE-ACETAMINOPHEN 5-325 MG PO TABS: 2.0000 | ORAL_TABLET | ORAL | 0 refills | Status: DC | PRN

## 2016-05-20 MED ORDER — SODIUM CHLORIDE 0.9 % IV BOLUS (SEPSIS)
1000.0000 mL | Freq: Once | INTRAVENOUS | Status: AC
  Administered 2016-05-20: 1000 mL via INTRAVENOUS

## 2016-05-20 NOTE — ED Provider Notes (Signed)
[place] DEPT Provider Note   CSN: [id number] Arrival date & time: 05/20/16  0848  By signing my name below, I, Bea Graff, attest that this documentation has been prepared under the direction and in the presence of Carmon Sails, PA-C. Electronically Signed: Bea Graff, ED Scribe. 05/20/16. 2:50 PM   History   Chief Complaint Chief Complaint  Patient presents with  . Back Pain    HPI Patrick [name] is a 76 y.o. malewith PMHx of BPH, CAD, HLD, HTN who presents to the Emergency Department complaining of severe, worsening, low, left sided back pain that radiates down the lateral LLE. He reports he fell approximately one month ago while helping his daughter move and has had the pain since, but pain is now worsening and untolerable. Pt was seen for the back pain by his PCP, Dr. Regis Bill on 05/17/16 and was supposed to follow up with orthopedics today but cancelled the appointment and presented to ED due to severity of pain. Pt has been prescribed Meloxicam, Flexeril, Prednisone and Gabapentin by his PCP that he reports taking as directed. He reports no pain relief from these medications. He reports having three acupuncture treatments about two weeks ago that seemed to help significantly but since he stopped the treatment the pain has worsened again. Aggravating factors include walking, bending forward and trunk movement. He denies alleviating factors. He denies numbness, tingling or weakness of the lower extremities, bowel or bladder incontinence, calf pain or swelling, bruising, wounds, fevers, CP, SOB, abdominal pain, diarrhea, constipation.  Additionally, pt also reports nausea, vomiting associated with fevers and chills yesterday into last night that lasted about 1.5 hours. His wife reports fever Tmax 101 degrees earlier this morning that had resolved since presenting to the ED. No known sick contacts with similar symptoms.  No new/different foods. Pt currently denies nausea and  chills.    HPI  Past Medical History:  Diagnosis Date  . Allergy   . Aortic root dilatation (HCC)    Mild by echo 11/2011 (52mm)  . BPH (benign prostatic hypertrophy)   . Bradycardia    Precluding BB use.  Marland Kitchen CAD (coronary artery disease)    a. s/p Taxus DES to RCA and PDA in 2006 .b. Myoview 8/11: Walked 12:37. EF 57%. no ischemia or scar. c. Mod CAD by cath 11/2011 (40-50% ISR of PDA -- for OP myoview to assess).  . Colon polyps   . Diverticulitis   . GERD (gastroesophageal reflux disease)   . Glaucoma   . Hyperlipidemia   . Hypertension   . Impaired fasting glucose   . Sleep apnea   . Syncope     Patient Active Problem List   Diagnosis Date Noted  . Left sided sciatica 05/09/2016  . Coronary artery disease involving native coronary artery of native heart without angina pectoris 04/15/2016  . Advance directive discussed with patient 08/08/2014  . Dilated aortic root ([place]) 12/11/2011  . Routine general medical examination at a health care facility 12/28/2010  . SEBORRHEA 04/25/2008  . IMPAIRED FASTING GLUCOSE 07/31/2007  . Hyperlipidemia 01/09/2007  . Essential hypertension 01/09/2007  . Coronary atherosclerosis 01/09/2007  . ALLERGIC RHINITIS 01/09/2007  . GERD 01/09/2007  . DIVERTICULOSIS, COLON 01/09/2007  . BPH without obstruction/lower urinary tract symptoms 01/09/2007  . 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 COLONIC POLYPS, HX OF 01/09/2007    Past Surgical History:  Procedure Laterality Date  . ANGIOPLASTY     percutaneous transluminal coronary  angioplasty and stenting of the right coronary  artery and posterior   . CATARACT EXTRACTION W/ INTRAOCULAR LENS  IMPLANT, BILATERAL  9/15  . COLONOSCOPY    . INTRAOCULAR LENS INSERTION    . LEFT HEART CATHETERIZATION WITH CORONARY ANGIOGRAM N/A 11/26/2011   Procedure: LEFT HEART CATHETERIZATION WITH CORONARY ANGIOGRAM;  Surgeon: Thayer Headings, MD;  Location: [facility] CATH LAB;  Service: Cardiovascular;  Laterality: N/A;  .  PILONIDAL CYST / SINUS EXCISION         Home Medications    Prior to Admission medications   Medication Sig Start Date End Date Taking? Authorizing Provider  amLODipine (NORVASC) 5 MG tablet TAKE 1 TABLET BY MOUTH DAILY 03/19/16  Yes Venia Carbon, MD  Ascorbic Acid (VITAMIN C) 1000 MG tablet Take 1,000 mg by mouth daily.     Yes Historical Provider, MD  aspirin 81 MG tablet Take 81 mg by mouth daily.     Yes Historical Provider, MD  Coenzyme Q10 (CO Q-10) 100 MG CAPS Take 1 capsule by mouth daily.    Yes Historical Provider, MD  fish oil-omega-3 fatty acids 1000 MG capsule Take 2 g by mouth daily.     Yes Historical Provider, MD  gabapentin (NEURONTIN) 100 MG capsule Take 1 capsule (100 mg total) by mouth 3 (three) times daily. Can Increase to300 mg tid as tolerated and needed . Patient taking differently: Take 300 mg by mouth 3 (three) times daily. Can Increase to 300 mg tid as tolerated and needed . 05/17/16  Yes Burnis Medin, MD  latanoprost (XALATAN) 0.005 % ophthalmic solution Place 1 drop into both eyes at bedtime.  01/24/14  Yes Historical Provider, MD  nitroGLYCERIN (NITROSTAT) 0.4 MG SL tablet Place 1 tablet (0.4 mg total) under the tongue every 5 (five) minutes x 3 doses as needed for chest pain. 11/08/15 02/07/17 Yes Thayer Headings, MD  rosuvastatin (CRESTOR) 20 MG tablet Take 0.5 tablets (10 mg total) by mouth daily. 10/16/15  Yes Thayer Headings, MD  valsartan-hydrochlorothiazide (DIOVAN-HCT) 320-25 MG tablet Take 1 tablet by mouth daily. 11/10/15  Yes Thayer Headings, MD  HYDROmorphone (DILAUDID) 2 MG tablet Take 1 tablet (2 mg total) by mouth every 4 (four) hours as needed for severe pain. 05/20/16   Kinnie Feil, PA-C  oxyCODONE-acetaminophen (PERCOCET/ROXICET) 5-325 MG tablet Take 2 tablets by mouth every 4 (four) hours as needed for severe pain. 05/20/16   Kinnie Feil, PA-C    Family History Family History  Problem Relation Age of Onset  . Coronary artery disease  Father   . Heart disease Father   . Heart failure Father   . Cancer Mother     ovarian cancer  . Hypertension Neg Hx   . Diabetes Neg Hx     Social History Social History  Substance Use Topics  . Smoking status: Former Smoker    Quit date: 05/13/1978  . Smokeless tobacco: Never Used  . Alcohol use Yes     Comment: wine nightly (2-3 glasses) if not working     Allergies   Atorvastatin; Demerol [meperidine]; Ezetimibe-simvastatin; Lisinopril; and Pravastatin   Review of Systems Review of Systems  Constitutional: Positive for chills and fever.  HENT: Negative for congestion and sore throat.   Eyes: Negative for visual disturbance.  Respiratory: Positive for cough. Negative for shortness of breath.   Cardiovascular: Negative for chest pain and palpitations.  Gastrointestinal: Positive for nausea and vomiting. Negative for abdominal pain, blood in stool, constipation and diarrhea.  Genitourinary: Negative  for decreased urine volume, difficulty urinating, flank pain and hematuria.       No bladder incontinence or retention.  Musculoskeletal: Positive for back pain and gait problem. Negative for joint swelling and myalgias.  Skin: Negative for rash.  Neurological: Negative for dizziness, syncope, weakness, light-headedness and headaches.  Hematological: Negative.   Psychiatric/Behavioral: Negative.      Physical Exam Updated Vital Signs BP 116/70 (BP Location: Left Arm)   Pulse 74   Temp 98.8 F (37.1 C) (Oral)   Resp 18   Ht 5\' 7"  (1.702 m)   Wt 88 kg   SpO2 98%   BMI 30.38 kg/m   Physical Exam  Constitutional: He is oriented to person, place, and time. He appears well-developed and well-nourished. No distress.  HENT:  Head: Normocephalic and atraumatic.  Nose: Nose normal.  Mouth/Throat: Oropharynx is clear and moist. No oropharyngeal exudate.  Eyes: Conjunctivae and EOM are normal. Pupils are equal, round, and reactive to light. No scleral icterus.  Neck:  Normal range of motion. Neck supple. No JVD present. No tracheal deviation present.  Cardiovascular: Normal rate, regular rhythm, normal heart sounds and intact distal pulses.   No murmur heard. Pulmonary/Chest: Effort normal and breath sounds normal. No respiratory distress. He has no wheezes.  Abdominal: Soft. Bowel sounds are normal. He exhibits no distension. There is no tenderness.  Musculoskeletal: Normal range of motion. He exhibits no deformity.  Antalgic gait favoring L side. No foot drag. Decreased spine flexion, extension, lateral bend and rotation due to exacerbation of L sided back pain.  No cervical, thoracic or lumbar midline tenderness.  L SI joint tenderness. No R SI joint tenderness.  Full ROM at hips, knees and ankles. L hip flexion, extension, rotation exacerbated L sided back pain.  + SLR and + Faber on L side.  No bony tenderness over lateral hips, knees or ankles.   Lymphadenopathy:    He has no cervical adenopathy.  Neurological: He is alert and oriented to person, place, and time.  5/5 strength in lower extremities.  Sensation to light touch intact in lower extremities.  No groin numbness.  Negative Romberg.   Skin: Skin is warm and dry. Capillary refill takes less than 2 seconds.  Psychiatric: He has a normal mood and affect. His behavior is normal. Judgment and thought content normal.  Nursing note and vitals reviewed.    ED Treatments / Results  Labs (all labs ordered are listed, but only abnormal results are displayed) Labs Reviewed  URINALYSIS, ROUTINE W REFLEX MICROSCOPIC - Abnormal; Notable for the following:       Result Value   Color, Urine AMBER (*)    Bilirubin Urine SMALL (*)    Protein, ur 30 (*)    Leukocytes, UA TRACE (*)    All other components within normal limits  CBC WITH DIFFERENTIAL/PLATELET - Abnormal; Notable for the following:    WBC 24.6 (*)    Neutro Abs 21.7 (*)    Monocytes Absolute 1.6 (*)    All other components within  normal limits  COMPREHENSIVE METABOLIC PANEL - Abnormal; Notable for the following:    Glucose, Bld 106 (*)    BUN 24 (*)    Creatinine, Ser 1.50 (*)    GFR calc non Af Amer 44 (*)    GFR calc Af Amer 51 (*)    All other components within normal limits  URINALYSIS, MICROSCOPIC (REFLEX) - Abnormal; Notable for the following:    Bacteria, UA FEW (*)  Squamous Epithelial / LPF 0-5 (*)    All other components within normal limits  I-STAT CG4 LACTIC ACID, ED - Abnormal; Notable for the following:    Lactic Acid, Venous 1.94 (*)    All other components within normal limits  LIPASE, BLOOD    EKG  EKG Interpretation  Date/Time:  Monday May 20 2016 12:41:00 EST Ventricular Rate:  84 PR Interval:    QRS Duration: 96 QT Interval:  355 QTC Calculation: 420 R Axis:   -46 Text Interpretation:  Sinus rhythm Probable left atrial enlargement LAD, consider left anterior fascicular block Abnormal R-wave progression, late transition Left ventricular hypertrophy No significant change since last tracing Confirmed by ZACKOWSKI  MD, SCOTT [phone]) on 05/20/2016 12:55:51 PM       Radiology Dg Chest 2 View  Result Date: 05/20/2016 CLINICAL DATA:  76 year old male with a history of fever and cough EXAM: CHEST  2 VIEW COMPARISON:  11/25/2011, 12/25/2009 FINDINGS: Cardiomediastinal silhouette unchanged in size and contour. No evidence of central vascular congestion. No pneumothorax or pleural effusion. Healed fractures of the right chest wall. No confluent airspace disease. Multilevel degenerative changes of the thoracic spine. IMPRESSION: Chronic lung changes without evidence of acute cardiopulmonary disease. Signed, Dulcy Fanny. Earleen Newport, DO [facility] Electronically Signed   By: Corrie Mckusick D.O.   On: 05/20/2016 12:22   Ct Lumbar Spine Wo Contrast  Result Date: 05/20/2016 CLINICAL DATA:  76 year old male fell backwards 04/11/2016. Still having  back pain extending into left buttock region and down left leg. History of cough, fever and chills. Subsequent encounter. EXAM: CT LUMBAR SPINE WITHOUT CONTRAST TECHNIQUE: Multidetector CT imaging of the lumbar spine was performed without intravenous contrast administration. Multiplanar CT image reconstructions were also generated. COMPARISON:  05/20/2016 chest x-ray. No comparison lumbar spine exam. FINDINGS: Segmentation: Last fully open disk space is labeled L5-S1. Present examination incorporates from T12-L1 disc space through the S2-S3 level. Alignment: Mild scoliosis convex right. Vertebrae: Small sclerotic focus left aspect L3 vertebral body, right aspect L4 vertebral body and medial aspect left iliac possibly small bone islands. No report history of prostate cancer to suggest these may represent sclerotic metastatic lesions. Small nonspecific lucencies of the ilium. Mild sacroiliac joint degenerative changes noted bilaterally. Paraspinal and other soft tissues: Right liver cyst incompletely assessed. Large left renal cysts including parapelvic cysts. Small right renal cyst. Within the anterior aspect of the mid to lower right kidney is a 1.6 cm hyperdense structure which could represent a hyperdense cyst but would require dedicated contrast enhanced renal MR to confirm such and exclude a hyperdense mass. Nonobstructing lower pole renal calculi bilateral. Atherosclerotic changes of the aorta and iliac arteries without aneurysmal dilation. L5-S1 endplate reactive changes most consistent with degenerative changes without surrounding findings to suggest discitis/osteomyelitis. Disc levels: T12-L1:  Negative. L1-2:  Negative. L2-3: Minimal Schmorl's node deformity. Minimal bulge. Very mild spinal stenosis. L3-4: Bulge. Facet degenerative changes. Slightly short pedicles multifactorial mild to slightly moderate spinal stenosis. L4-5: Prominent bilateral facet degenerative changes. 2 mm anterior slip L4. Moderate  bulge slightly greater to left. Dorsal epidural fat. Slightly short pedicles. Multifactorial moderate to marked bilateral lateral recess stenosis and moderate to marked spinal stenosis. Mild left foraminal narrowing. L5-S1: Disc degeneration with endplate reactive changes. Moderate bulge and osteophyte with slight contact with the upper S1 nerve roots greater on the right. No significant thecal sac compromise. Mild foraminal narrowing greater on the left. IMPRESSION: Summary of pertinent findings includes:  L4-5 multifactorial moderate to marked bilateral lateral recess stenosis and moderate to marked spinal stenosis. Mild left foraminal narrowing. L3-4 multifactorial mild to slightly moderate spinal stenosis. L5-S1 moderate bulge and osteophyte with slight contact with the upper S1 nerve roots greater on the right. No significant thecal sac compromise. Mild foraminal narrowing greater on the left. L2-3 very mild spinal stenosis. Please see above for further detail. Within the anterior aspect of the mid to lower right kidney is a 1.6 cm hyperdense structure which could represent a hyperdense cyst but would require dedicated contrast enhanced renal MR to confirm such and exclude a hyperdense mass. Electronically Signed   By: Genia Del M.D.   On: 05/20/2016 14:12   Mr Lumbar Spine Wo Contrast  Result Date: 05/20/2016 CLINICAL DATA:  76 year old male status post fall backwards in November 2017 with continued back pain radiating to the left buttock and down the left leg. Lumbar spinal stenosis on CT today. Initial encounter. EXAM: MRI LUMBAR SPINE WITHOUT CONTRAST TECHNIQUE: Multiplanar, multisequence MR imaging of the lumbar spine was performed. No intravenous contrast was administered. COMPARISON:  Lumbar spine CT without contrast 1329 hours today. FINDINGS: Segmentation:  Normal as demonstrated on the CT today. Alignment: Mild grade 1 anterolisthesis of L4 on L5. Mild retrolisthesis of L5 on S1. Vertebrae: No  marrow edema or evidence of acute osseous abnormality. Chronic degenerative endplate changes at 075-GRM. Visible sacrum and SI joints are within normal limits. Scattered benign vertebral body hemangiomas including T12 and L2. Conus medullaris: Extends to the L1 level and appears normal. Paraspinal and other soft tissues: Stable from the earlier CT (please see that report). Negative visualized posterior paraspinal soft tissues. Disc levels: T11-T12:  Mild left facet hypertrophy.  No stenosis. T12-L1:  Negative. L1-L2:  Negative. L2-L3:  Minimal disc bulging and facet hypertrophy.  No stenosis. L3-L4: Mild disc desiccation and circumferential disc bulge. Mild facet hypertrophy. Borderline to mild spinal stenosis. No lateral recess or foraminal stenosis. L4-L5: Grade 1 anterolisthesis that mild grade 1 anterolisthesis. Severe facet hypertrophy. Fluid in both facet joints. Moderate to severe ligament flavum hypertrophy. There is a left side degenerative synovial cyst arising anteriorly from the facet encompassing 5 x 8 x 8 mm (AP by transverse by CC). Mild disc desiccation with circumferential disc bulge, broad-based posterior component. Subsequent severe spinal stenosis and left lateral recess stenosis (descending left L5 nerve root level). Up to moderate right lateral recess stenosis. No foraminal stenosis. L5-S1: Disc desiccation and severe disc space loss. Anterior eccentric circumferential disc bulge and endplate spurring. Mild facet hypertrophy. No significant stenosis. IMPRESSION: 1. 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 Neural impingement is in part related to an 8 mm left side synovial cyst. Query left L5 radiculitis. 2. Less pronounced lumbar spine degeneration elsewhere. Borderline to mild spinal stenosis at L3-L4. 3.  No acute osseous abnormality. Electronically Signed   By: Genevie Ann M.D.   On: 05/20/2016  17:01    Procedures Procedures (including critical care time)  Medications Ordered in ED Medications  oxyCODONE-acetaminophen (PERCOCET/ROXICET) 5-325 MG per tablet 1 tablet (1 tablet Oral Given 05/20/16 1710)  sodium chloride 0.9 % bolus 1,000 mL (0 mLs Intravenous Stopped 05/20/16 1600)  HYDROmorphone (DILAUDID) injection 0.5 mg (0.5 mg Intravenous Given 05/20/16 1424)  HYDROmorphone (DILAUDID) injection 1 mg (1 mg Intravenous Given 05/20/16 1726)     Initial Impression / Assessment and Plan / ED Course  I have reviewed the triage vital signs and the nursing notes.  Pertinent labs & imaging results that were available during my care of the patient were reviewed by me and considered in my medical decision making (see chart for details).  Clinical Course    Initial differential diagnosis includes lumbar strain, compression fracture, disc herniation, cauda equina, epidural abscess.  I suspect pt's nausea, vomiting, chills and fevers that lasted <24 hours may have been due to a viral illness.  Pt denied nausea, abdominal pain had no episodes of emesis in the ED. Pt tolerated PO in ED. EKG and CXR negative.  U/A negative. Lipase normal. Lactic acid barely elevated at 1.94. CBC remarkable for leukocytosis at 24 with elevated creatinine on CBC to 1.5 from baseline of 1.  Pt's last dose of steroids was >1 week ago.  Suspect leukocytosis and elevated creatinine from last night's n/v, fevers and chills. Pt given fluids in ED. CT scan initially done but MRI recommended by Dr. Rogene Houston after his evaluation.  MRI remarkable for significant bony abnormalities starting at L5 and below with mild anterolisthesis and synovial cyst with subsequent severe spinal stenosis at L4-L5 (please see full report).  Pt is neuro intact without bladder retention/incontinence or groin numbness.  Given MRI results, neuro intact and reliable f/u patient will be discharged with analgesics, strict ED return instructions and urgent  ortho surgery f/u within 2-3 days for re-evaluation and further discussion of MRI findings. Pt instructed to f/u with PCP for recheck of CBC for resolution of leukocytosis.  Pt discussed with and evaluated by Dr. Rogene Houston who agrees with ED tx plan and discharge plan.  Discussed MRI and CT scan results findings with pt and wife, handed them report. Pt verbalized understanding and is agreeable to discharge plan.   I personally performed the services described in this documentation, which was scribed in my presence. The recorded information has been reviewed and is accurate.  Final Clinical Impressions(s) / ED Diagnoses   Final diagnoses:  Acute left-sided low back pain, with sciatica presence unspecified    New Prescriptions Discharge Medication List as of 05/20/2016  5:43 PM    START taking these medications   Details  HYDROmorphone (DILAUDID) 2 MG tablet Take 1 tablet (2 mg total) by mouth every 4 (four) hours as needed for severe pain., Starting Mon 05/20/2016, Print    oxyCODONE-acetaminophen (PERCOCET/ROXICET) 5-325 MG tablet Take 2 tablets by mouth every 4 (four) hours as needed for severe pain., Starting Mon 05/20/2016, Print         Kinnie Feil, PA-C 05/21/16 2156    Kinnie Feil, PA-C 05/21/16 2206    Fredia Sorrow, MD 05/21/16 [phone]

## 2016-05-20 NOTE — ED Triage Notes (Signed)
Per [facility], Pt is coming from Back pain that has worsened in the left back that is radiating down on his left hip. Pt was helping his daughter move a month ago when he fell and reports the pain has worsened. Pt has had some relief intermittently with medication. Pt was to have follow-up with Orthopedic after seeing PCP, but reports the pain has been too much to tolerate. Vitals per EMS: 8/10 pain, 130/81, 114 HR, 18 RR, 94% on RA

## 2016-05-20 NOTE — ED Provider Notes (Deleted)
[facility] DEPT Provider Note    By signing my name below, I, Bea Graff, attest that this documentation has been prepared under the direction and in the presence of Carmon Sails, PA-C. Electronically Signed: Bea Graff, ED Scribe. 05/20/16. 2:50 PM    History   Chief Complaint Chief Complaint  Patient presents with  . Back Pain   The history is provided by the patient and medical records. No language interpreter was used.    HPI Comments:  Patrick [name] is a 76 y.o. male with PMHx of BPH, CAD, HLD, HTN who presents to the Emergency Department complaining of severe, worsening, low, left sided back pain thatradiates down the lateral LLE. He reports he fell approximately one month ago while helping his daughter move and has had the pain since, but painis now worsening and untolerable. Pt was seen for the back pain by his PCP, Dr. Regis Bill on 05/17/16 and was supposed to follow up with orthopedics today but cancelled the appointment and presented to ED due to severity of pain. Pt has been prescribed Meloxicam, Flexeril, Prednisone and Gabapentin by his PCP that he reports taking as directed. He reportsno pain relief from these medications. He reports having three acupuncture treatmentsabout two weeks ago that seemed to help significantly but since he stopped the treatment the pain has worsened again. Aggravating factors include walking, bending forward and trunk movement. He denies alleviatingfactors. Hedenies numbness, tingling or weakness of the lower extremities, bowel or bladder incontinence, calf pain or swelling, bruising, wounds, fevers, CP, SOB, abdominal pain, diarrhea, constipation.  Additionally, pt also reports nausea, vomiting associated with fevers and chills yesterday into last night that lasted about 1.5 hours. His wife reports fever Tmax 101 degrees earlier this morning that had resolved since presenting to the ED. No known sick contacts with similar  symptoms.  No new/different foods. Pt currently denies nausea and chills.   Past Medical History:  Diagnosis Date  . Allergy   . Aortic root dilatation (HCC)    Mild by echo 11/2011 (75mm)  . BPH (benign prostatic hypertrophy)   . Bradycardia    Precluding BB use.  Marland Kitchen CAD (coronary artery disease)    a. s/p Taxus DES to RCA and PDA in 2006 .b. Myoview 8/11: Walked 12:37. EF 57%. no ischemia or scar. c. Mod CAD by cath 11/2011 (40-50% ISR of PDA -- for OP myoview to assess).  . Colon polyps   . Diverticulitis   . GERD (gastroesophageal reflux disease)   . Glaucoma   . Hyperlipidemia   . Hypertension   . Impaired fasting glucose   . Sleep apnea   . Syncope     Patient Active Problem List   Diagnosis Date Noted  . Left sided sciatica 05/09/2016  . Coronary artery disease involving native coronary artery of native heart without angina pectoris 04/15/2016  . Advance directive discussed with patient 08/08/2014  . Dilated aortic root ([place]) 12/11/2011  . Routine general medical examination at a health care facility 12/28/2010  . SEBORRHEA 04/25/2008  . IMPAIRED FASTING GLUCOSE 07/31/2007  . Hyperlipidemia 01/09/2007  . Essential hypertension 01/09/2007  . Coronary atherosclerosis 01/09/2007  . ALLERGIC RHINITIS 01/09/2007  . GERD 01/09/2007  . DIVERTICULOSIS, COLON 01/09/2007  . BPH without obstruction/lower urinary tract symptoms 01/09/2007  . 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 COLONIC POLYPS, HX OF 01/09/2007    Past Surgical History:  Procedure Laterality Date  . ANGIOPLASTY     percutaneous transluminal coronary  angioplasty and stenting of  the right coronary artery and posterior   . CATARACT EXTRACTION W/ INTRAOCULAR LENS  IMPLANT, BILATERAL  9/15  . COLONOSCOPY    . INTRAOCULAR LENS INSERTION    . LEFT HEART CATHETERIZATION WITH CORONARY ANGIOGRAM N/A 11/26/2011   Procedure: LEFT HEART CATHETERIZATION WITH CORONARY ANGIOGRAM;  Surgeon: Thayer Headings, MD;  Location: [facility]  CATH LAB;  Service: Cardiovascular;  Laterality: N/A;  . PILONIDAL CYST / SINUS EXCISION         Home Medications    Prior to Admission medications   Medication Sig Start Date End Date Taking? Authorizing Provider  amLODipine (NORVASC) 5 MG tablet TAKE 1 TABLET BY MOUTH DAILY 03/19/16   Venia Carbon, MD  Ascorbic Acid (VITAMIN C) 1000 MG tablet Take 1,000 mg by mouth daily.      Historical Provider, MD  aspirin 81 MG tablet Take 81 mg by mouth daily.      Historical Provider, MD  Coenzyme Q10 (CO Q-10) 100 MG CAPS Take 1 capsule by mouth daily.     Historical Provider, MD  cyclobenzaprine (FLEXERIL) 10 MG tablet Take 0.5-1 tablets (5-10 mg total) by mouth at bedtime as needed for muscle spasms. 05/09/16   Venia Carbon, MD  fish oil-omega-3 fatty acids 1000 MG capsule Take 2 g by mouth daily.      Historical Provider, MD  gabapentin (NEURONTIN) 100 MG capsule Take 1 capsule (100 mg total) by mouth 3 (three) times daily. Can Increase to300 mg tid as tolerated and needed . 05/17/16   Burnis Medin, MD  latanoprost (XALATAN) 0.005 % ophthalmic solution Place 1 drop into both eyes at bedtime.  01/24/14   Historical Provider, MD  meloxicam (MOBIC) 7.5 MG tablet Take 1 tablet (7.5 mg total) by mouth daily as needed for pain. 05/09/16   Venia Carbon, MD  nitroGLYCERIN (NITROSTAT) 0.4 MG SL tablet Place 1 tablet (0.4 mg total) under the tongue every 5 (five) minutes x 3 doses as needed for chest pain. 11/08/15 02/07/17  Thayer Headings, MD  rosuvastatin (CRESTOR) 20 MG tablet Take 0.5 tablets (10 mg total) by mouth daily. 10/16/15   Thayer Headings, MD  triamcinolone cream (KENALOG) 0.1 % Apply 1 application topically 2 (two) times daily. 03/11/13   Venia Carbon, MD  valsartan-hydrochlorothiazide (DIOVAN-HCT) 320-25 MG tablet Take 1 tablet by mouth daily. 11/10/15   Thayer Headings, MD    Family History Family History  Problem Relation Age of Onset  . Coronary artery disease Father   .  Heart disease Father   . Heart failure Father   . Cancer Mother     ovarian cancer  . Hypertension Neg Hx   . Diabetes Neg Hx     Social History Social History  Substance Use Topics  . Smoking status: Former Smoker    Quit date: 05/13/1978  . Smokeless tobacco: Never Used  . Alcohol use Yes     Comment: wine nightly (2-3 glasses) if not working     Allergies   Lisinopril; Atorvastatin; Demerol [meperidine]; and Ezetimibe-simvastatin   Review of Systems Review of Systems     Physical Exam Updated Vital Signs BP 122/72 (BP Location: Left Arm)   Pulse 111   Temp 98.9 F (37.2 C) (Oral)   Resp 16   Ht 5\' 7"  (1.702 m)   Wt 194 lb (88 kg)   SpO2 94%   BMI 30.38 kg/m   Physical Exam  Constitutional: Vital signs are normal.  ED Treatments / Results   DIAGNOSTIC STUDIES: Oxygen Saturation is 94% on RA, adequate by my interpretation.   COORDINATION OF CARE: 11:14 AM- Will order CT and speak with Dr. Sherry Ruffing. Pt verbalizes understanding and agrees to plan.  2:50 PM- Dr. Rogene Houston examined patient and recommends MRI prior to discharge.   Medications  oxyCODONE-acetaminophen (PERCOCET/ROXICET) 5-325 MG per tablet 1 tablet (1 tablet Oral Given 05/20/16 0923)  oxyCODONE-acetaminophen (PERCOCET/ROXICET) 5-325 MG per tablet (not administered)  sodium chloride 0.9 % bolus 1,000 mL (1,000 mLs Intravenous New Bag/Given 05/20/16 1424)  HYDROmorphone (DILAUDID) injection 0.5 mg (0.5 mg Intravenous Given 05/20/16 1424)   Labs (all labs ordered are listed, but only abnormal results are displayed) Labs Reviewed  URINALYSIS, ROUTINE W REFLEX MICROSCOPIC - Abnormal; Notable for the following:       Result Value   Color, Urine AMBER (*)    Bilirubin Urine SMALL (*)    Protein, ur 30 (*)    Leukocytes, UA TRACE (*)    All other components within normal limits  CBC WITH DIFFERENTIAL/PLATELET - Abnormal; Notable for the following:    WBC 24.6 (*)    Neutro Abs 21.7 (*)     Monocytes Absolute 1.6 (*)    All other components within normal limits  COMPREHENSIVE METABOLIC PANEL - Abnormal; Notable for the following:    Glucose, Bld 106 (*)    BUN 24 (*)    Creatinine, Ser 1.50 (*)    GFR calc non Af Amer 44 (*)    GFR calc Af Amer 51 (*)    All other components within normal limits  URINALYSIS, MICROSCOPIC (REFLEX) - Abnormal; Notable for the following:    Bacteria, UA FEW (*)    Squamous Epithelial / LPF 0-5 (*)    All other components within normal limits  I-STAT CG4 LACTIC ACID, ED - Abnormal; Notable for the following:    Lactic Acid, Venous 1.94 (*)    All other components within normal limits  LIPASE, BLOOD  I-STAT CG4 LACTIC ACID, ED    EKG  EKG Interpretation  Date/Time:  Monday May 20 2016 12:41:00 EST Ventricular Rate:  84 PR Interval:    QRS Duration: 96 QT Interval:  355 QTC Calculation: 420 R Axis:   -46 Text Interpretation:  Sinus rhythm Probable left atrial enlargement LAD, consider left anterior fascicular block Abnormal R-wave progression, late transition Left ventricular hypertrophy No significant change since last tracing Confirmed by ZACKOWSKI  MD, SCOTT [phone]) on 05/20/2016 12:55:51 PM       Radiology Dg Chest 2 View  Result Date: 05/20/2016 CLINICAL DATA:  76 year old male with a history of fever and cough EXAM: CHEST  2 VIEW COMPARISON:  11/25/2011, 12/25/2009 FINDINGS: Cardiomediastinal silhouette unchanged in size and contour. No evidence of central vascular congestion. No pneumothorax or pleural effusion. Healed fractures of the right chest wall. No confluent airspace disease. Multilevel degenerative changes of the thoracic spine. IMPRESSION: Chronic lung changes without evidence of acute cardiopulmonary disease. Signed, Dulcy Fanny. Earleen Newport, DO [facility] Electronically Signed   By: Corrie Mckusick D.O.   On: 05/20/2016 12:22   Ct Lumbar Spine Wo Contrast  Result  Date: 05/20/2016 CLINICAL DATA:  76 year old male fell backwards 04/11/2016. Still having back pain extending into left buttock region and down left leg. History of cough, fever and chills. Subsequent encounter. EXAM: CT LUMBAR SPINE WITHOUT CONTRAST TECHNIQUE: Multidetector CT imaging of the lumbar spine was performed without intravenous contrast administration.  Multiplanar CT image reconstructions were also generated. COMPARISON:  05/20/2016 chest x-ray. No comparison lumbar spine exam. FINDINGS: Segmentation: Last fully open disk space is labeled L5-S1. Present examination incorporates from T12-L1 disc space through the S2-S3 level. Alignment: Mild scoliosis convex right. Vertebrae: Small sclerotic focus left aspect L3 vertebral body, right aspect L4 vertebral body and medial aspect left iliac possibly small bone islands. No report history of prostate cancer to suggest these may represent sclerotic metastatic lesions. Small nonspecific lucencies of the ilium. Mild sacroiliac joint degenerative changes noted bilaterally. Paraspinal and other soft tissues: Right liver cyst incompletely assessed. Large left renal cysts including parapelvic cysts. Small right renal cyst. Within the anterior aspect of the mid to lower right kidney is a 1.6 cm hyperdense structure which could represent a hyperdense cyst but would require dedicated contrast enhanced renal MR to confirm such and exclude a hyperdense mass. Nonobstructing lower pole renal calculi bilateral. Atherosclerotic changes of the aorta and iliac arteries without aneurysmal dilation. L5-S1 endplate reactive changes most consistent with degenerative changes without surrounding findings to suggest discitis/osteomyelitis. Disc levels: T12-L1:  Negative. L1-2:  Negative. L2-3: Minimal Schmorl's node deformity. Minimal bulge. Very mild spinal stenosis. L3-4: Bulge. Facet degenerative changes. Slightly short pedicles multifactorial mild to slightly moderate spinal  stenosis. L4-5: Prominent bilateral facet degenerative changes. 2 mm anterior slip L4. Moderate bulge slightly greater to left. Dorsal epidural fat. Slightly short pedicles. Multifactorial moderate to marked bilateral lateral recess stenosis and moderate to marked spinal stenosis. Mild left foraminal narrowing. L5-S1: Disc degeneration with endplate reactive changes. Moderate bulge and osteophyte with slight contact with the upper S1 nerve roots greater on the right. No significant thecal sac compromise. Mild foraminal narrowing greater on the left. IMPRESSION: Summary of pertinent findings includes: L4-5 multifactorial moderate to marked bilateral lateral recess stenosis and moderate to marked spinal stenosis. Mild left foraminal narrowing. L3-4 multifactorial mild to slightly moderate spinal stenosis. L5-S1 moderate bulge and osteophyte with slight contact with the upper S1 nerve roots greater on the right. No significant thecal sac compromise. Mild foraminal narrowing greater on the left. L2-3 very mild spinal stenosis. Please see above for further detail. Within the anterior aspect of the mid to lower right kidney is a 1.6 cm hyperdense structure which could represent a hyperdense cyst but would require dedicated contrast enhanced renal MR to confirm such and exclude a hyperdense mass. Electronically Signed   By: Genia Del M.D.   On: 05/20/2016 14:12    Procedures Procedures (including critical care time)  Medications Ordered in ED Medications  oxyCODONE-acetaminophen (PERCOCET/ROXICET) 5-325 MG per tablet 1 tablet (1 tablet Oral Given 05/20/16 0923)  oxyCODONE-acetaminophen (PERCOCET/ROXICET) 5-325 MG per tablet (not administered)  sodium chloride 0.9 % bolus 1,000 mL (1,000 mLs Intravenous New Bag/Given 05/20/16 1424)  HYDROmorphone (DILAUDID) injection 0.5 mg (0.5 mg Intravenous Given 05/20/16 1424)     Initial Impression / Assessment and Plan / ED Course  I have reviewed the triage vital signs  and the nursing notes.  Pertinent labs & imaging results that were available during my care of the patient were reviewed by me and considered in my medical decision making (see chart for details).  Clinical Course     I personally performed the services described in this documentation, which was scribed in my presence. The recorded information has been reviewed and is accurate.   Final Clinical Impressions(s) / ED Diagnoses   Final diagnoses:  None    New Prescriptions New Prescriptions  No medications on file     Kinnie Feil, Hershal Coria 05/21/16 2205

## 2016-05-20 NOTE — ED Provider Notes (Signed)
Medical screening examination/treatment/procedure(s) were conducted as a shared visit with non-physician practitioner(s) and myself.  I personally evaluated the patient during the encounter.   EKG Interpretation  Date/Time:  Monday May 20 2016 12:41:00 EST Ventricular Rate:  84 PR Interval:    QRS Duration: 96 QT Interval:  355 QTC Calculation: 420 R Axis:   -46 Text Interpretation:  Sinus rhythm Probable left atrial enlargement LAD, consider left anterior fascicular block Abnormal R-wave progression, late transition Left ventricular hypertrophy No significant change since last tracing Confirmed by Nasiah Polinsky  MD, Rockland Kotarski [phone]) on 05/20/2016 12:55:51 PM       Patient seen by me. Along with the physician assistant. Patient been having back pain consistent with a left-sided sciatica since November 30. Multiple visits to primary care provider with outpatient meds no narcotics without any significant relief. Patient did see an acupuncturist around the Christmas time for a period of time which was actually his nephew from out of town and had the fair amount of improvement with that but now is back having severe pain making difficulty putting shoes on.  For this CT of the back was done which shows no significant bony abnormalities based on the duration of the symptoms even no there is no left foot numbness or weakness will go ahead and get an MRI of the back. Patient had a referral pending to orthopedics by primary care doctor but they couldn't see him quickly so he made an appointment [facility] for this morning but the pain was so bad he came here. Pain improved here with hydromorphone and Percocet.  Patient yesterday had what sounds like some sort of viral illness with nausea and vomiting fever and chills. Patient had some nausea some morning at 6 AM but has none sense. Patient does have a significant leukocytosis which will require follow-up by primary care doctor to make sure it goes  back to normal. Patient has a mild lactic acidosis but nothing in any critical range. Patient clinically is very nontoxic. Patient has not been on steroids recently so it does not explain the leukocytosis. Has been on steroids in the past but not recently.   MRI will be obtained. Patient enlisted show some emergent reason for admission will be discharged home on hydromorphone orally and Percocet orally. And follow-up with orthopedics.Patient will recontact [facility] for follow-up.   Fredia Sorrow, MD 05/20/16 [phone]

## 2016-05-20 NOTE — ED Notes (Signed)
EKG given to Dr. Zackowski  

## 2016-05-20 NOTE — ED Notes (Signed)
Transported to MRI

## 2016-05-20 NOTE — ED Notes (Signed)
While in MRI patient's IV came out, tech attempted to tape it back without success.  This tech cleaned site.  No bleeding at this time.

## 2016-05-20 NOTE — ED Notes (Signed)
Patient transported to CT 

## 2016-05-20 NOTE — Discharge Instructions (Signed)
You have been prescribed to pain medications, please take these as prescribed. Avoid consuming alcohol or driving a vehicle after taking these medications as they may cause drowsiness.  Please call your orthopedic surgeon as soon as we are able to make an appointment for further discussion of your CT scan and MRI results.  Return to the emergency department if you develop bladder incontinence or bladder retention, groin numbness, numbness or weakness in your lower extremities.

## 2016-05-21 ENCOUNTER — Telehealth: Payer: Self-pay | Admitting: Internal Medicine

## 2016-05-21 DIAGNOSIS — M5432 Sciatica, left side: Secondary | ICD-10-CM

## 2016-05-21 NOTE — Telephone Encounter (Signed)
Referral made 

## 2016-05-21 NOTE — Telephone Encounter (Signed)
Pt was in the ed yesterday for his back pain.  He is wanting a referral to Dr Ellene Route.  He states he has had visits with you regarding his back. Pt requesting call back  Number is [phone] Thanks

## 2016-05-22 DIAGNOSIS — M5136 Other intervertebral disc degeneration, lumbar region: Secondary | ICD-10-CM | POA: Diagnosis not present

## 2016-05-22 DIAGNOSIS — M9905 Segmental and somatic dysfunction of pelvic region: Secondary | ICD-10-CM | POA: Diagnosis not present

## 2016-05-22 DIAGNOSIS — M955 Acquired deformity of pelvis: Secondary | ICD-10-CM | POA: Diagnosis not present

## 2016-05-22 DIAGNOSIS — M9903 Segmental and somatic dysfunction of lumbar region: Secondary | ICD-10-CM | POA: Diagnosis not present

## 2016-05-23 ENCOUNTER — Other Ambulatory Visit: Payer: Self-pay | Admitting: Neurosurgery

## 2016-05-23 ENCOUNTER — Telehealth: Payer: Self-pay | Admitting: Cardiovascular Disease

## 2016-05-23 DIAGNOSIS — M9905 Segmental and somatic dysfunction of pelvic region: Secondary | ICD-10-CM | POA: Diagnosis not present

## 2016-05-23 DIAGNOSIS — M5136 Other intervertebral disc degeneration, lumbar region: Secondary | ICD-10-CM | POA: Diagnosis not present

## 2016-05-23 DIAGNOSIS — M9903 Segmental and somatic dysfunction of lumbar region: Secondary | ICD-10-CM | POA: Diagnosis not present

## 2016-05-23 DIAGNOSIS — M7138 Other bursal cyst, other site: Secondary | ICD-10-CM | POA: Diagnosis not present

## 2016-05-23 DIAGNOSIS — M955 Acquired deformity of pelvis: Secondary | ICD-10-CM | POA: Diagnosis not present

## 2016-05-23 NOTE — Telephone Encounter (Signed)
New Message    Having lumbar laminectomy for synovial cyst 05/27/16 with Dr Cran and needs medical clearance.   [phone] fax

## 2016-05-23 NOTE — Telephone Encounter (Signed)
Mr. Padmanabhan is at low risk for upcoming laminectomy.

## 2016-05-24 ENCOUNTER — Encounter (HOSPITAL_COMMUNITY): Payer: Self-pay | Admitting: *Deleted

## 2016-05-24 NOTE — Telephone Encounter (Signed)
Pt may hold his ASA for 5 days if requested however , I would prefer that he continue it if possible

## 2016-05-24 NOTE — Telephone Encounter (Signed)
Clearance printed and faxed to [phone] with confirmation received.

## 2016-05-27 ENCOUNTER — Inpatient Hospital Stay (HOSPITAL_COMMUNITY)
Admission: RE | Admit: 2016-05-27 | Discharge: 2016-05-27 | DRG: 520 | Disposition: A | Discharge status: Home / Self Care | Payer: Medicare Other | Source: Ambulatory Visit | Attending: Neurosurgery | Admitting: Neurosurgery

## 2016-05-27 ENCOUNTER — Encounter (HOSPITAL_COMMUNITY)
Admission: RE | Disposition: A | Discharge status: Home / Self Care | Payer: Self-pay | Source: Ambulatory Visit | Attending: Neurosurgery

## 2016-05-27 ENCOUNTER — Inpatient Hospital Stay (HOSPITAL_COMMUNITY): Payer: Medicare Other | Admitting: Certified Registered Nurse Anesthetist

## 2016-05-27 ENCOUNTER — Inpatient Hospital Stay (HOSPITAL_COMMUNITY): Payer: Medicare Other

## 2016-05-27 ENCOUNTER — Encounter (HOSPITAL_COMMUNITY): Payer: Self-pay | Admitting: Certified Registered Nurse Anesthetist

## 2016-05-27 DIAGNOSIS — G589 Mononeuropathy, unspecified: Secondary | ICD-10-CM | POA: Diagnosis not present

## 2016-05-27 DIAGNOSIS — G4730 Sleep apnea, unspecified: Secondary | ICD-10-CM | POA: Diagnosis present

## 2016-05-27 DIAGNOSIS — N400 Enlarged prostate without lower urinary tract symptoms: Secondary | ICD-10-CM | POA: Diagnosis present

## 2016-05-27 DIAGNOSIS — M48061 Spinal stenosis, lumbar region without neurogenic claudication: Secondary | ICD-10-CM | POA: Diagnosis present

## 2016-05-27 DIAGNOSIS — M549 Dorsalgia, unspecified: Secondary | ICD-10-CM | POA: Diagnosis present

## 2016-05-27 DIAGNOSIS — Z79899 Other long term (current) drug therapy: Secondary | ICD-10-CM | POA: Diagnosis not present

## 2016-05-27 DIAGNOSIS — M47816 Spondylosis without myelopathy or radiculopathy, lumbar region: Principal | ICD-10-CM | POA: Diagnosis present

## 2016-05-27 DIAGNOSIS — Z7982 Long term (current) use of aspirin: Secondary | ICD-10-CM | POA: Diagnosis not present

## 2016-05-27 DIAGNOSIS — M7138 Other bursal cyst, other site: Secondary | ICD-10-CM | POA: Diagnosis present

## 2016-05-27 DIAGNOSIS — Z888 Allergy status to other drugs, medicaments and biological substances status: Secondary | ICD-10-CM

## 2016-05-27 DIAGNOSIS — E785 Hyperlipidemia, unspecified: Secondary | ICD-10-CM | POA: Diagnosis not present

## 2016-05-27 DIAGNOSIS — Z86010 Personal history of colonic polyps: Secondary | ICD-10-CM

## 2016-05-27 DIAGNOSIS — Z87891 Personal history of nicotine dependence: Secondary | ICD-10-CM | POA: Diagnosis not present

## 2016-05-27 DIAGNOSIS — Z9841 Cataract extraction status, right eye: Secondary | ICD-10-CM | POA: Diagnosis not present

## 2016-05-27 DIAGNOSIS — H409 Unspecified glaucoma: Secondary | ICD-10-CM | POA: Diagnosis not present

## 2016-05-27 DIAGNOSIS — K219 Gastro-esophageal reflux disease without esophagitis: Secondary | ICD-10-CM | POA: Diagnosis not present

## 2016-05-27 DIAGNOSIS — Z8249 Family history of ischemic heart disease and other diseases of the circulatory system: Secondary | ICD-10-CM

## 2016-05-27 DIAGNOSIS — I10 Essential (primary) hypertension: Secondary | ICD-10-CM | POA: Diagnosis present

## 2016-05-27 DIAGNOSIS — M4726 Other spondylosis with radiculopathy, lumbar region: Secondary | ICD-10-CM | POA: Diagnosis not present

## 2016-05-27 DIAGNOSIS — I2510 Atherosclerotic heart disease of native coronary artery without angina pectoris: Secondary | ICD-10-CM | POA: Diagnosis not present

## 2016-05-27 DIAGNOSIS — Z961 Presence of intraocular lens: Secondary | ICD-10-CM | POA: Diagnosis not present

## 2016-05-27 DIAGNOSIS — Z9842 Cataract extraction status, left eye: Secondary | ICD-10-CM

## 2016-05-27 DIAGNOSIS — M5136 Other intervertebral disc degeneration, lumbar region: Secondary | ICD-10-CM | POA: Diagnosis not present

## 2016-05-27 DIAGNOSIS — Z955 Presence of coronary angioplasty implant and graft: Secondary | ICD-10-CM | POA: Diagnosis not present

## 2016-05-27 HISTORY — DX: Other complications of anesthesia, initial encounter: T88.59XA

## 2016-05-27 HISTORY — PX: LUMBAR LAMINECTOMY/DECOMPRESSION MICRODISCECTOMY: SHX5026

## 2016-05-27 HISTORY — DX: Adverse effect of unspecified anesthetic, initial encounter: T41.45XA

## 2016-05-27 LAB — CBC
HCT: 43.2 % (ref 39.0–52.0)
Hemoglobin: 14.8 g/dL (ref 13.0–17.0)
MCH: 30.8 pg (ref 26.0–34.0)
MCHC: 34.3 g/dL (ref 30.0–36.0)
MCV: 89.8 fL (ref 78.0–100.0)
PLATELETS: 229 10*3/uL (ref 150–400)
RBC: 4.81 MIL/uL (ref 4.22–5.81)
RDW: 13.7 % (ref 11.5–15.5)
WBC: 5 10*3/uL (ref 4.0–10.5)

## 2016-05-27 LAB — BASIC METABOLIC PANEL
ANION GAP: 13 (ref 5–15)
BUN: 32 mg/dL — AB (ref 6–20)
CALCIUM: 9.2 mg/dL (ref 8.9–10.3)
CO2: 21 mmol/L — ABNORMAL LOW (ref 22–32)
Chloride: 103 mmol/L (ref 101–111)
Creatinine, Ser: 1.12 mg/dL (ref 0.61–1.24)
GFR calc Af Amer: >60 mL/min (ref >60)
GFR calc non Af Amer: >60 mL/min (ref >60)
GLUCOSE: 121 mg/dL — AB (ref 65–99)
POTASSIUM: 3.8 mmol/L (ref 3.5–5.1)
SODIUM: 137 mmol/L (ref 135–145)

## 2016-05-27 LAB — SURGICAL PCR SCREEN
MRSA, PCR: NEGATIVE
Staphylococcus aureus: NEGATIVE

## 2016-05-27 SURGERY — LUMBAR LAMINECTOMY/DECOMPRESSION MICRODISCECTOMY 1 LEVEL
Anesthesia: General | Site: Back | Laterality: Left

## 2016-05-27 MED ORDER — SODIUM CHLORIDE 0.9 % IR SOLN
Status: DC | PRN
  Administered 2016-05-27: 13:00:00

## 2016-05-27 MED ORDER — BUPIVACAINE HCL (PF) 0.25 % IJ SOLN
INTRAMUSCULAR | Status: AC
  Filled 2016-05-27: qty 30

## 2016-05-27 MED ORDER — ASPIRIN EC 81 MG PO TBEC
81.0000 mg | DELAYED_RELEASE_TABLET | Freq: Every day | ORAL | Status: DC
Start: 2016-05-27 — End: 2016-05-27

## 2016-05-27 MED ORDER — SUGAMMADEX SODIUM 200 MG/2ML IV SOLN
INTRAVENOUS | Status: DC | PRN
  Administered 2016-05-27: 200 mg via INTRAVENOUS

## 2016-05-27 MED ORDER — HYDROMORPHONE HCL 1 MG/ML IJ SOLN: 0.2500 mg | INTRAMUSCULAR | Status: DC | PRN

## 2016-05-27 MED ORDER — ONDANSETRON HCL 4 MG/2ML IJ SOLN
INTRAMUSCULAR | Status: AC
  Filled 2016-05-27: qty 8

## 2016-05-27 MED ORDER — ACETAMINOPHEN 10 MG/ML IV SOLN
INTRAVENOUS | Status: DC | PRN
  Administered 2016-05-27: 1000 mg via INTRAVENOUS

## 2016-05-27 MED ORDER — ACETAMINOPHEN 650 MG RE SUPP: 650.0000 mg | RECTAL | Status: DC | PRN

## 2016-05-27 MED ORDER — ALUM & MAG HYDROXIDE-SIMETH 200-200-20 MG/5ML PO SUSP: 30.0000 mL | Freq: Four times a day (QID) | ORAL | Status: DC | PRN

## 2016-05-27 MED ORDER — OMEGA-3 FATTY ACIDS 1000 MG PO CAPS: 1.0000 g | ORAL_CAPSULE | Freq: Every day | ORAL | Status: DC

## 2016-05-27 MED ORDER — HYDROMORPHONE HCL 1 MG/ML IJ SOLN: 0.5000 mg | INTRAMUSCULAR | Status: DC | PRN

## 2016-05-27 MED ORDER — FENTANYL CITRATE (PF) 100 MCG/2ML IJ SOLN
INTRAMUSCULAR | Status: DC | PRN
  Administered 2016-05-27 (×3): 50 ug via INTRAVENOUS

## 2016-05-27 MED ORDER — ROSUVASTATIN CALCIUM 5 MG PO TABS: 10.0000 mg | ORAL_TABLET | Freq: Every day | ORAL | Status: DC

## 2016-05-27 MED ORDER — ACETAMINOPHEN 10 MG/ML IV SOLN
INTRAVENOUS | Status: AC
  Filled 2016-05-27: qty 100

## 2016-05-27 MED ORDER — SODIUM CHLORIDE 0.9% FLUSH: 3.0000 mL | INTRAVENOUS | Status: DC | PRN

## 2016-05-27 MED ORDER — LIDOCAINE 2% (20 MG/ML) 5 ML SYRINGE
INTRAMUSCULAR | Status: AC
  Filled 2016-05-27: qty 15

## 2016-05-27 MED ORDER — CO Q-10 100 MG PO CAPS: 1.0000 | ORAL_CAPSULE | Freq: Every day | ORAL | Status: DC

## 2016-05-27 MED ORDER — SODIUM CHLORIDE 0.9% FLUSH: 3.0000 mL | Freq: Two times a day (BID) | INTRAVENOUS | Status: DC

## 2016-05-27 MED ORDER — LIDOCAINE HCL (CARDIAC) 20 MG/ML IV SOLN
INTRAVENOUS | Status: DC | PRN
  Administered 2016-05-27: 60 mg via INTRAVENOUS

## 2016-05-27 MED ORDER — CEFAZOLIN SODIUM-DEXTROSE 2-4 GM/100ML-% IV SOLN
2.0000 g | INTRAVENOUS | Status: AC
  Administered 2016-05-27: 2 g via INTRAVENOUS
  Filled 2016-05-27: qty 100

## 2016-05-27 MED ORDER — PANTOPRAZOLE SODIUM 40 MG IV SOLR: 40.0000 mg | Freq: Every day | INTRAVENOUS | Status: DC

## 2016-05-27 MED ORDER — LIDOCAINE-EPINEPHRINE 1 %-1:100000 IJ SOLN
INTRAMUSCULAR | Status: DC | PRN
  Administered 2016-05-27: 10 mL via INTRADERMAL

## 2016-05-27 MED ORDER — VALSARTAN-HYDROCHLOROTHIAZIDE 320-25 MG PO TABS: 1.0000 | ORAL_TABLET | Freq: Every day | ORAL | Status: DC

## 2016-05-27 MED ORDER — ONDANSETRON HCL 4 MG/2ML IJ SOLN
INTRAMUSCULAR | Status: DC | PRN
  Administered 2016-05-27: 4 mg via INTRAVENOUS

## 2016-05-27 MED ORDER — PROPOFOL 10 MG/ML IV BOLUS
INTRAVENOUS | Status: DC | PRN
  Administered 2016-05-27: 110 mg via INTRAVENOUS

## 2016-05-27 MED ORDER — LACTATED RINGERS IV SOLN
INTRAVENOUS | Status: DC
  Administered 2016-05-27 (×2): via INTRAVENOUS

## 2016-05-27 MED ORDER — AMLODIPINE BESYLATE 5 MG PO TABS: 5.0000 mg | ORAL_TABLET | Freq: Every day | ORAL | Status: DC

## 2016-05-27 MED ORDER — FENTANYL CITRATE (PF) 100 MCG/2ML IJ SOLN
INTRAMUSCULAR | Status: AC
  Filled 2016-05-27: qty 4

## 2016-05-27 MED ORDER — CEFAZOLIN SODIUM-DEXTROSE 2-4 GM/100ML-% IV SOLN: 2.0000 g | Freq: Three times a day (TID) | INTRAVENOUS | Status: DC

## 2016-05-27 MED ORDER — 0.9 % SODIUM CHLORIDE (POUR BTL) OPTIME
TOPICAL | Status: DC | PRN
  Administered 2016-05-27: 1000 mL

## 2016-05-27 MED ORDER — MENTHOL 3 MG MT LOZG: 1.0000 | LOZENGE | OROMUCOSAL | Status: DC | PRN

## 2016-05-27 MED ORDER — ONDANSETRON HCL 4 MG/2ML IJ SOLN
INTRAMUSCULAR | Status: AC
  Administered 2016-05-27: 4 mg
  Filled 2016-05-27: qty 2

## 2016-05-27 MED ORDER — IRBESARTAN 300 MG PO TABS: 300.0000 mg | ORAL_TABLET | Freq: Every day | ORAL | Status: DC

## 2016-05-27 MED ORDER — ROCURONIUM BROMIDE 100 MG/10ML IV SOLN
INTRAVENOUS | Status: DC | PRN
  Administered 2016-05-27: 50 mg via INTRAVENOUS

## 2016-05-27 MED ORDER — EPHEDRINE SULFATE 50 MG/ML IJ SOLN
INTRAMUSCULAR | Status: DC | PRN
  Administered 2016-05-27: 5 mg via INTRAVENOUS

## 2016-05-27 MED ORDER — PHENOL 1.4 % MT LIQD: 1.0000 | OROMUCOSAL | Status: DC | PRN

## 2016-05-27 MED ORDER — CHLORHEXIDINE GLUCONATE CLOTH 2 % EX PADS: 6.0000 | MEDICATED_PAD | Freq: Once | CUTANEOUS | Status: DC

## 2016-05-27 MED ORDER — ROCURONIUM BROMIDE 50 MG/5ML IV SOSY
PREFILLED_SYRINGE | INTRAVENOUS | Status: AC
  Filled 2016-05-27: qty 5

## 2016-05-27 MED ORDER — PHENYLEPHRINE 40 MCG/ML (10ML) SYRINGE FOR IV PUSH (FOR BLOOD PRESSURE SUPPORT)
PREFILLED_SYRINGE | INTRAVENOUS | Status: AC
  Filled 2016-05-27: qty 10

## 2016-05-27 MED ORDER — FENTANYL CITRATE (PF) 100 MCG/2ML IJ SOLN
INTRAMUSCULAR | Status: AC
  Administered 2016-05-27: 100 ug
  Filled 2016-05-27: qty 2

## 2016-05-27 MED ORDER — PROPOFOL 10 MG/ML IV BOLUS
INTRAVENOUS | Status: AC
  Filled 2016-05-27: qty 20

## 2016-05-27 MED ORDER — MUPIROCIN 2 % EX OINT
1.0000 "application " | TOPICAL_OINTMENT | Freq: Once | CUTANEOUS | Status: AC
  Administered 2016-05-27: 1 via TOPICAL
  Filled 2016-05-27: qty 22

## 2016-05-27 MED ORDER — NITROGLYCERIN 0.4 MG SL SUBL: 0.4000 mg | SUBLINGUAL_TABLET | SUBLINGUAL | Status: DC | PRN

## 2016-05-27 MED ORDER — THROMBIN 5000 UNITS EX SOLR
CUTANEOUS | Status: DC | PRN
  Administered 2016-05-27: 10000 [IU] via TOPICAL

## 2016-05-27 MED ORDER — HYDROCHLOROTHIAZIDE 25 MG PO TABS: 25.0000 mg | ORAL_TABLET | Freq: Every day | ORAL | Status: DC

## 2016-05-27 MED ORDER — DEXAMETHASONE SODIUM PHOSPHATE 10 MG/ML IJ SOLN
INTRAMUSCULAR | Status: AC
  Filled 2016-05-27: qty 1

## 2016-05-27 MED ORDER — OXYCODONE-ACETAMINOPHEN 5-325 MG PO TABS: 1.0000 | ORAL_TABLET | ORAL | Status: DC | PRN

## 2016-05-27 MED ORDER — HYDROCODONE-ACETAMINOPHEN 5-325 MG PO TABS: 1.0000 | ORAL_TABLET | Freq: Four times a day (QID) | ORAL | Status: DC | PRN

## 2016-05-27 MED ORDER — BUPIVACAINE HCL (PF) 0.25 % IJ SOLN
INTRAMUSCULAR | Status: DC | PRN
  Administered 2016-05-27: 10 mL

## 2016-05-27 MED ORDER — PHENYLEPHRINE HCL 10 MG/ML IJ SOLN
INTRAVENOUS | Status: DC | PRN
  Administered 2016-05-27: 25 ug/min via INTRAVENOUS

## 2016-05-27 MED ORDER — ONDANSETRON HCL 4 MG/2ML IJ SOLN: 4.0000 mg | INTRAMUSCULAR | Status: DC | PRN

## 2016-05-27 MED ORDER — PHENYLEPHRINE HCL 10 MG/ML IJ SOLN
INTRAMUSCULAR | Status: DC | PRN
  Administered 2016-05-27: 40 ug via INTRAVENOUS
  Administered 2016-05-27: 80 ug via INTRAVENOUS

## 2016-05-27 MED ORDER — SODIUM CHLORIDE 0.9 % IV SOLN: 250.0000 mL | INTRAVENOUS | Status: DC

## 2016-05-27 MED ORDER — DEXAMETHASONE SODIUM PHOSPHATE 10 MG/ML IJ SOLN
10.0000 mg | INTRAMUSCULAR | Status: AC
  Administered 2016-05-27: 10 mg via INTRAVENOUS
  Filled 2016-05-27: qty 1

## 2016-05-27 MED ORDER — HEMOSTATIC AGENTS (NO CHARGE) OPTIME
TOPICAL | Status: DC | PRN
  Administered 2016-05-27: 1 via TOPICAL

## 2016-05-27 MED ORDER — CYCLOBENZAPRINE HCL 10 MG PO TABS
10.0000 mg | ORAL_TABLET | Freq: Three times a day (TID) | ORAL | Status: DC | PRN
  Filled 2016-05-27: qty 1

## 2016-05-27 MED ORDER — LIDOCAINE-EPINEPHRINE (PF) 2 %-1:200000 IJ SOLN
INTRAMUSCULAR | Status: AC
  Filled 2016-05-27: qty 20

## 2016-05-27 MED ORDER — LATANOPROST 0.005 % OP SOLN
1.0000 [drp] | Freq: Every day | OPHTHALMIC | Status: DC
  Filled 2016-05-27: qty 2.5

## 2016-05-27 MED ORDER — THROMBIN 5000 UNITS EX SOLR
CUTANEOUS | Status: AC
  Filled 2016-05-27: qty 10000

## 2016-05-27 MED ORDER — ACETAMINOPHEN 325 MG PO TABS: 650.0000 mg | ORAL_TABLET | ORAL | Status: DC | PRN

## 2016-05-27 SURGICAL SUPPLY — 59 items
ADH SKN CLS APL DERMABOND .7 (GAUZE/BANDAGES/DRESSINGS) ×1
APL SKNCLS STERI-STRIP NONHPOA (GAUZE/BANDAGES/DRESSINGS) ×1
BAG DECANTER FOR FLEXI CONT (MISCELLANEOUS) ×2 IMPLANT
BENZOIN TINCTURE PRP APPL 2/3 (GAUZE/BANDAGES/DRESSINGS) ×2 IMPLANT
BLADE CLIPPER SURG (BLADE) IMPLANT
BLADE SURG 11 STRL SS (BLADE) ×2 IMPLANT
BUR MATCHSTICK NEURO 3.0 LAGG (BURR) ×2 IMPLANT
BUR PRECISION FLUTE 6.0 (BURR) ×2 IMPLANT
CANISTER SUCT 3000ML PPV (MISCELLANEOUS) ×2 IMPLANT
CARTRIDGE OIL MAESTRO DRILL (MISCELLANEOUS) ×1 IMPLANT
CONT SPEC 4OZ CLIKSEAL STRL BL (MISCELLANEOUS) ×2 IMPLANT
DECANTER SPIKE VIAL GLASS SM (MISCELLANEOUS) ×2 IMPLANT
DERMABOND ADVANCED (GAUZE/BANDAGES/DRESSINGS) ×1
DERMABOND ADVANCED .7 DNX12 (GAUZE/BANDAGES/DRESSINGS) ×1 IMPLANT
DIFFUSER DRILL AIR PNEUMATIC (MISCELLANEOUS) ×2 IMPLANT
DRAPE HALF SHEET 40X57 (DRAPES) IMPLANT
DRAPE LAPAROTOMY 100X72X124 (DRAPES) ×2 IMPLANT
DRAPE MICROSCOPE LEICA (MISCELLANEOUS) ×2 IMPLANT
DRAPE POUCH INSTRU U-SHP 10X18 (DRAPES) ×2 IMPLANT
DRAPE SURG 17X23 STRL (DRAPES) ×2 IMPLANT
DRSG OPSITE POSTOP 4X6 (GAUZE/BANDAGES/DRESSINGS) ×2 IMPLANT
DURAPREP 26ML APPLICATOR (WOUND CARE) ×4 IMPLANT
ELECT REM PT RETURN 9FT ADLT (ELECTROSURGICAL) ×2
ELECTRODE REM PT RTRN 9FT ADLT (ELECTROSURGICAL) ×1 IMPLANT
GAUZE SPONGE 4X4 12PLY STRL (GAUZE/BANDAGES/DRESSINGS) ×2 IMPLANT
GAUZE SPONGE 4X4 16PLY XRAY LF (GAUZE/BANDAGES/DRESSINGS) IMPLANT
GLOVE BIO SURGEON STRL SZ8 (GLOVE) ×2 IMPLANT
GLOVE BIOGEL PI IND STRL 7.0 (GLOVE) ×1 IMPLANT
GLOVE BIOGEL PI IND STRL 7.5 (GLOVE) ×2 IMPLANT
GLOVE BIOGEL PI INDICATOR 7.0 (GLOVE) ×1
GLOVE BIOGEL PI INDICATOR 7.5 (GLOVE) ×2
GLOVE EXAM NITRILE LRG STRL (GLOVE) IMPLANT
GLOVE EXAM NITRILE XL STR (GLOVE) IMPLANT
GLOVE EXAM NITRILE XS STR PU (GLOVE) IMPLANT
GLOVE INDICATOR 8.5 STRL (GLOVE) ×2 IMPLANT
GLOVE SS BIOGEL STRL SZ 7.5 (GLOVE) ×1 IMPLANT
GLOVE SUPERSENSE BIOGEL SZ 7.5 (GLOVE) ×1
GOWN STRL REUS W/ TWL LRG LVL3 (GOWN DISPOSABLE) ×2 IMPLANT
GOWN STRL REUS W/ TWL XL LVL3 (GOWN DISPOSABLE) ×2 IMPLANT
GOWN STRL REUS W/TWL 2XL LVL3 (GOWN DISPOSABLE) IMPLANT
GOWN STRL REUS W/TWL LRG LVL3 (GOWN DISPOSABLE) ×4
GOWN STRL REUS W/TWL XL LVL3 (GOWN DISPOSABLE) ×4
KIT BASIN OR (CUSTOM PROCEDURE TRAY) ×2 IMPLANT
KIT ROOM TURNOVER OR (KITS) ×2 IMPLANT
NEEDLE HYPO 22GX1.5 SAFETY (NEEDLE) ×2 IMPLANT
NEEDLE SPNL 22GX3.5 QUINCKE BK (NEEDLE) ×2 IMPLANT
NS IRRIG 1000ML POUR BTL (IV SOLUTION) ×2 IMPLANT
OIL CARTRIDGE MAESTRO DRILL (MISCELLANEOUS) ×2
PACK LAMINECTOMY NEURO (CUSTOM PROCEDURE TRAY) ×2 IMPLANT
RUBBERBAND STERILE (MISCELLANEOUS) ×4 IMPLANT
SPONGE SURGIFOAM ABS GEL SZ50 (HEMOSTASIS) ×2 IMPLANT
STRIP CLOSURE SKIN 1/2X4 (GAUZE/BANDAGES/DRESSINGS) ×2 IMPLANT
SUT VIC AB 0 CT1 18XCR BRD8 (SUTURE) ×1 IMPLANT
SUT VIC AB 0 CT1 8-18 (SUTURE) ×2
SUT VIC AB 2-0 CT1 18 (SUTURE) ×2 IMPLANT
SUT VICRYL 4-0 PS2 18IN ABS (SUTURE) ×2 IMPLANT
TOWEL OR 17X24 6PK STRL BLUE (TOWEL DISPOSABLE) ×2 IMPLANT
TOWEL OR 17X26 10 PK STRL BLUE (TOWEL DISPOSABLE) ×2 IMPLANT
WATER STERILE IRR 1000ML POUR (IV SOLUTION) ×2 IMPLANT

## 2016-05-27 NOTE — Op Note (Signed)
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  Surgeon: Dominica Severin Bevely Hackbart  Assistant: Marland Kitchen ditty  Anesthesia: Gen.  EBL: Minimal  History of present illness: Patient is a 76 year old some is a progress worsening back and right-sided L5 radicular symptoms workup revealed a synovial cyst causing severe thecal sac compression or right L5 nerve compression. Due to patient's failure conservative treatment imaging findings and progression of clinical syndrome I recommended decompressive laminectomy for resection removal of synovial cyst. I extensively went over the risks and benefits of the operation the patient as well as perioperative course expectations of outcome and alternatives surgery and he understood and agreed to proceed forward.  Operative procedure: Patient brought into the or was induced on general anesthesia positioned prone the Wilson frame her back was prepped and draped in routine sterile fashion. Pressure localize the appropriate level so after infiltration of 10 mL lidocaine with epi a midline incision was made and Bovie light cautery was used dissect through the subcutaneous tissue and fascia and then subperiosteal dissections care lamina of L4 and L5 on the left. Interoperative x-ray confirmed with navigation proper level so high-speed drill was used to drill down the vast majority of the L4 lamina on the left superior aspect of L5 medial facet complex. Then laminotomy was begun with a 3 minute Kerrison punch identified ligament flavum which was removed piecemeal fashion there was a large synovial cyst just at the superior aspect the disc space extending all with the undersurface of the L4 foramen this was teased off  of the dura identified the L5 nerve root and L5 pedicle and working underneath the under bit the medial gutter remove the cyst wall of the medial facet removed the cyst off the thecal sac and proximal L5 nerve root EXTENDING to the inferior aspect of the L4 foramen. At the end of the cystectomy there was no further stenosis in the thecal sac or the L5 foramen. The wound scope see her good fixing space was maintained Gelfoam was opened up the dura the muscle. Fascia reapproximate layers with after Vicryl skin is closed running 4 subcuticular Dermabond benzo and Steri-Strips and sterile dressing was applied patient recovered in stable condition. At the end the case all needle counts and sponge counts were correct.

## 2016-05-27 NOTE — Anesthesia Postprocedure Evaluation (Signed)
Anesthesia Post Note  Patient: Patrick [name]  Procedure(s) Performed: Procedure(s) (LRB): Laminectomy for facet/synovial cyst - Lumbar four - Lumbar five - left (Left)  Patient location during evaluation: PACU Anesthesia Type: General Level of consciousness: awake and alert Pain management: pain level controlled Vital Signs Assessment: post-procedure vital signs reviewed and stable Respiratory status: spontaneous breathing, nonlabored ventilation and respiratory function stable Cardiovascular status: blood pressure returned to baseline and stable Postop Assessment: no signs of nausea or vomiting Anesthetic complications: no       Last Vitals:  Vitals:   05/27/16 1500 05/27/16 1515  BP:  114/88  Pulse: (!) 52 64  Resp: 10 18  Temp: 36.5 C 36.4 C    Last Pain:  Vitals:   05/27/16 1500  TempSrc:   PainSc: 3                  Mardene Lessig,W. EDMOND

## 2016-05-27 NOTE — Discharge Instructions (Signed)
No lifting bending twisting no driving   Wound Care Keep incision covered and dry for one week.  If you shower prior to then, cover incision with plastic wrap.  You may remove outer bandage after one week and shower.  Do not put any creams, lotions, or ointments on incision. Leave steri-strips on neck.  They will fall off by themselves. Activity Walk each and every day, increasing distance each day. No lifting greater than 5 lbs.  Avoid bending, arching, or twisting. No driving for 2 weeks; may ride as a passenger locally. If provided with back brace, wear when out of bed.  It is not necessary to wear in bed. Diet Resume your normal diet.  Return to Work Will be discussed at you follow up appointment. Call Your Doctor If Any of These Occur Redness, drainage, or swelling at the wound.  Temperature greater than 101 degrees. Severe pain not relieved by pain medication. Incision starts to come apart. Follow Up Appt Call today for appointment in 1-2 weeks [phone] or for problems.  If you have any hardware placed in your spine, you will need an x-ray before your appointment.

## 2016-05-27 NOTE — Discharge Summary (Signed)
  Physician Discharge Summary  Patient ID: Patrick [name] MRN: [id number] DOB/AGE: April 03, 1941 76 y.o.  Admit date: 05/27/2016 Discharge date: 05/27/2016  Admission Diagnoses:Left-sided synovial cyst L4-5  Discharge Diagnoses: Same Active Problems:   Synovial cyst of lumbar facet joint   Discharged Condition: good  Hospital Course: Patient admitted hospital underwent a right-sided decompressive laminectomy for removal of synovial cyst. Postoperatively patient did very well recovered in the floor on the floor was angling and voiding spontaneously tolerating regular diet stable for discharge home.  Consults: Significant Diagnostic Studies: Treatments: Decompressive laminectomy L4-5. Removal of synovial cyst on the left Discharge Exam: Blood pressure 114/88, pulse 64, temperature 97.6 F (36.4 C), resp. rate 18, weight 88 kg (194 lb), SpO2 95 %. Strength 5 out of 5 wound clean dry and intact  Disposition: Home   Allergies as of 05/27/2016      Reactions   Atorvastatin Other (See Comments)   MYALGIAS, LEG CRAMPING   Ezetimibe-simvastatin Other (See Comments)   MYALGIAS, CRAMPING   Lisinopril Cough   Pravastatin Other (See Comments)   Myalgias at 40mg  dosage      Medication List    TAKE these medications   amLODipine 5 MG tablet Commonly known as:  NORVASC TAKE 1 TABLET BY MOUTH DAILY   aspirin 81 MG tablet Take 81 mg by mouth daily.   Co Q-10 100 MG Caps Take 1 capsule by mouth daily.   fish oil-omega-3 fatty acids 1000 MG capsule Take 1 g by mouth daily.   HYDROcodone-acetaminophen 5-325 MG tablet Commonly known as:  NORCO/VICODIN Take 1 tablet by mouth every 6 (six) hours as needed for moderate pain.   latanoprost 0.005 % ophthalmic solution Commonly known as:  XALATAN Place 1 drop into both eyes at bedtime.   nitroGLYCERIN 0.4 MG SL tablet Commonly known as:  NITROSTAT Place 1 tablet (0.4 mg total) under the tongue every 5 (five) minutes x 3 doses as needed  for chest pain.   rosuvastatin 20 MG tablet Commonly known as:  CRESTOR Take 0.5 tablets (10 mg total) by mouth daily.   valsartan-hydrochlorothiazide 320-25 MG tablet Commonly known as:  DIOVAN-HCT Take 1 tablet by mouth daily.        Signed: Deshawna Mcneece P 05/27/2016, 5:28 PM

## 2016-05-27 NOTE — Anesthesia Preprocedure Evaluation (Addendum)
Anesthesia Evaluation  Patient identified by MRN, date of birth, ID band Patient awake    Reviewed: Allergy & Precautions, H&P , NPO status , Patient's Chart, lab work & pertinent test results  Airway Mallampati: III  TM Distance: >3 FB Neck ROM: Full    Dental no notable dental hx. (+) Teeth Intact, Dental Advisory Given   Pulmonary former smoker,    Pulmonary exam normal breath sounds clear to auscultation       Cardiovascular hypertension, Pt. on medications + CAD, + Cardiac Stents and + Peripheral Vascular Disease   Rhythm:Regular Rate:Normal     Neuro/Psych negative neurological ROS  negative psych ROS   GI/Hepatic Neg liver ROS, GERD  ,  Endo/Other  negative endocrine ROS  Renal/GU negative Renal ROS  negative genitourinary   Musculoskeletal   Abdominal   Peds  Hematology negative hematology ROS (+)   Anesthesia Other Findings   Reproductive/Obstetrics negative OB ROS                            Anesthesia Physical Anesthesia Plan  ASA: III  Anesthesia Plan: General   Post-op Pain Management:    Induction: Intravenous  Airway Management Planned: Oral ETT  Additional Equipment:   Intra-op Plan:   Post-operative Plan: Extubation in OR  Informed Consent: I have reviewed the patients History and Physical, chart, labs and discussed the procedure including the risks, benefits and alternatives for the proposed anesthesia with the patient or authorized representative who has indicated his/her understanding and acceptance.   Dental advisory given  Plan Discussed with: CRNA  Anesthesia Plan Comments:         Anesthesia Quick Evaluation

## 2016-05-27 NOTE — H&P (Signed)
Patrick [name] is an 76 y.o. male.   Chief Complaint: Back and left leg pain HPI: 76 year old gentleman with back and left leg pain rating down L5 distribution with weakness in his left EHL and workup revealed a synovial cyst causing severe stenosis at L4-5 with displacement left L5 nerve root. Due to patient's failure conservative treatment imaging findings and progression of clinical syndrome I recommended decompressive laminectomy for resection of synovial cyst deciliter reviewed the risks and benefits of the operation the patient as well as perioperative course expectations of outcome alternatives surgery and he understood and agreed to proceed forward.  Past Medical History:  Diagnosis Date  . Allergy   . Aortic root dilatation (HCC)    Mild by echo 11/2011 (42m)  . BPH (benign prostatic hypertrophy)   . Bradycardia    Precluding BB use.  .Marland KitchenCAD (coronary artery disease)    a. s/p Taxus DES to RCA and PDA in 2006 .b. Myoview 8/11: Walked 12:37. EF 57%. no ischemia or scar. c. Mod CAD by cath 11/2011 (40-50% ISR of PDA -- for OP myoview to assess).  . Colon polyps   . Complication of anesthesia    "they said they about lost, me when I had eye surgery"  . Diverticulitis   . GERD (gastroesophageal reflux disease)   . Glaucoma   . Hyperlipidemia   . Hypertension   . Impaired fasting glucose   . Sleep apnea   . Syncope     Past Surgical History:  Procedure Laterality Date  . ANGIOPLASTY     percutaneous transluminal coronary  angioplasty and stenting of the right coronary artery and posterior   . CATARACT EXTRACTION W/ INTRAOCULAR LENS  IMPLANT, BILATERAL  9/15  . COLONOSCOPY    . INTRAOCULAR LENS INSERTION    . LEFT HEART CATHETERIZATION WITH CORONARY ANGIOGRAM N/A 11/26/2011   Procedure: LEFT HEART CATHETERIZATION WITH CORONARY ANGIOGRAM;  Surgeon: PThayer Headings MD;  [facility];  Service: Cardiovascular;  Laterality: N/A;  . PILONIDAL CYST / SINUS EXCISION       Family History  Problem Relation Age of Onset  . Coronary artery disease Father   . Heart disease Father   . Heart failure Father   . Cancer Mother     ovarian cancer  . Hypertension Neg Hx   . Diabetes Neg Hx    Social History:  reports that he quit smoking about 38 years ago. He quit after 30.00 years of use. He has never used smokeless tobacco. He reports that he drinks about 2.4 oz of alcohol per week . He reports that he does not use drugs.  Allergies:  Allergies  Allergen Reactions  . Atorvastatin Other (See Comments)    MYALGIAS, LEG CRAMPING  . Ezetimibe-Simvastatin Other (See Comments)    MYALGIAS, CRAMPING  . Lisinopril Cough  . Pravastatin Other (See Comments)    Myalgias at 457mdosage    Medications Prior to Admission  Medication Sig Dispense Refill  . amLODipine (NORVASC) 5 MG tablet TAKE 1 TABLET BY MOUTH DAILY 90 tablet 3  . Coenzyme Q10 (CO Q-10) 100 MG CAPS Take 1 capsule by mouth daily.     . fish oil-omega-3 fatty acids 1000 MG capsule Take 1 g by mouth daily.     . Marland KitchenYDROcodone-acetaminophen (NORCO/VICODIN) 5-325 MG tablet Take 1 tablet by mouth every 6 (six) hours as needed for moderate pain.    . Marland Kitchenatanoprost (XALATAN) 0.005 % ophthalmic solution Place 1 drop  into both eyes at bedtime.     . nitroGLYCERIN (NITROSTAT) 0.4 MG SL tablet Place 1 tablet (0.4 mg total) under the tongue every 5 (five) minutes x 3 doses as needed for chest pain. 25 tablet 6  . rosuvastatin (CRESTOR) 20 MG tablet Take 0.5 tablets (10 mg total) by mouth daily. 45 tablet 3  . valsartan-hydrochlorothiazide (DIOVAN-HCT) 320-25 MG tablet Take 1 tablet by mouth daily. 90 tablet 3  . aspirin 81 MG tablet Take 81 mg by mouth daily.        Results for orders placed or performed during the hospital encounter of 05/27/16 (from the past 48 hour(s))  CBC     Status: None   Collection Time: 05/27/16 10:14 AM  Result Value Ref Range   WBC 5.0 4.0 - 10.5 K/uL   RBC 4.81 4.22 - 5.81 MIL/uL    Hemoglobin 14.8 13.0 - 17.0 g/dL   HCT 43.2 39.0 - 52.0 %   MCV 89.8 78.0 - 100.0 fL   MCH 30.8 26.0 - 34.0 pg   MCHC 34.3 30.0 - 36.0 g/dL   RDW 13.7 11.5 - 15.5 %   Platelets 229 150 - 400 K/uL  Basic metabolic panel     Status: Abnormal   Collection Time: 05/27/16 10:14 AM  Result Value Ref Range   Sodium 137 135 - 145 mmol/L   Potassium 3.8 3.5 - 5.1 mmol/L   Chloride 103 101 - 111 mmol/L   CO2 21 (L) 22 - 32 mmol/L   Glucose, Bld 121 (H) 65 - 99 mg/dL   BUN 32 (H) 6 - 20 mg/dL   Creatinine, Ser 1.12 0.61 - 1.24 mg/dL   Calcium 9.2 8.9 - 10.3 mg/dL   GFR calc non Af Amer >60 >60 mL/min   GFR calc Af Amer >60 >60 mL/min    Comment: (NOTE) The eGFR has been calculated using the CKD EPI equation. This calculation has not been validated in all clinical situations. eGFR's persistently <60 mL/min signify possible Chronic Kidney Disease.    Anion gap 13 5 - 15   No results found.  Review of Systems  Musculoskeletal: Positive for back pain, joint pain and myalgias.  Neurological: Positive for tingling, sensory change and focal weakness.  All other systems reviewed and are negative.   Blood pressure (!) 144/63, pulse 83, temperature 97.9 F (36.6 C), temperature source Oral, resp. rate 18, weight 88 kg (194 lb), SpO2 95 %. Physical Exam  Constitutional: He appears well-developed and well-nourished.  HENT:  Head: Normocephalic.  Eyes: Pupils are equal, round, and reactive to light.  Respiratory: Effort normal.  Neurological: He is alert. He has normal strength. GCS eye subscore is 4. GCS verbal subscore is 5. GCS motor subscore is 6.  Strength is 5 out of 5 iliopsoas, quads, hamstrings, gastric, into tibialis and EHL on the right however he's got 4 out of 5 weakness in his left EHL  Skin: Skin is warm and dry.     Assessment/Plan 76 year old gentleman presents for left-sided L4-5 laminectomy for resection of synovial cyst  Gabriel Paulding P, MD 05/27/2016, 12:16 PM

## 2016-05-27 NOTE — Anesthesia Procedure Notes (Signed)
Procedure Name: Intubation Date/Time: 05/27/2016 12:28 PM Performed by: Candis Shine Pre-anesthesia Checklist: Patient identified, Emergency Drugs available, Suction available and Patient being monitored Patient Re-evaluated:Patient Re-evaluated prior to inductionOxygen Delivery Method: Circle System Utilized Preoxygenation: Pre-oxygenation with 100% oxygen Intubation Type: IV induction Ventilation: Mask ventilation without difficulty and Oral airway inserted - appropriate to patient size Laryngoscope Size: Mac and 3 Grade View: Grade I Tube type: Oral Tube size: 7.5 mm Number of attempts: 1 Airway Equipment and Method: Stylet and Oral airway Placement Confirmation: ETT inserted through vocal cords under direct vision,  positive ETCO2 and breath sounds checked- equal and bilateral Secured at: 22 cm Tube secured with: Tape Dental Injury: Teeth and Oropharynx as per pre-operative assessment

## 2016-05-27 NOTE — Transfer of Care (Signed)
Immediate Anesthesia Transfer of Care Note  Patient: Patrick [name]  Procedure(s) Performed: Procedure(s) with comments: Laminectomy for facet/synovial cyst - Lumbar four - Lumbar five - left (Left) - Laminectomy for facet/synovial cyst - Lumbar four - Lumbar five - left  Patient Location: PACU  Anesthesia Type:General  Level of Consciousness: awake, alert  and oriented  Airway & Oxygen Therapy: Patient Spontanous Breathing and Patient connected to nasal cannula oxygen  Post-op Assessment: Report given to RN and Post -op Vital signs reviewed and stable  Post vital signs: Reviewed and stable  Last Vitals:  Vitals:   05/27/16 0959  BP: (!) 144/63  Pulse: 83  Resp: 18  Temp: 36.6 C    Last Pain:  Vitals:   05/27/16 0959  TempSrc: Oral      Patients Stated Pain Goal: 3 ([phone] [id number])  Complications: No apparent anesthesia complications

## 2016-05-27 NOTE — Progress Notes (Signed)
Pt doing well. Pt and daughter given D/C instructions with Rx's, verbal understanding was provided. Pt's incision is clean and dry with no sign of infection. Pt's IV was removed prior to D/C. Pt D/C'd home via wheelchair @ 1815 per MD order. Pt is stable @ D/C and has no other needs at this time. Holli Humbles, RN

## 2016-05-28 ENCOUNTER — Telehealth: Payer: Self-pay

## 2016-05-28 ENCOUNTER — Encounter (HOSPITAL_COMMUNITY): Payer: Self-pay | Admitting: Neurosurgery

## 2016-05-28 NOTE — Telephone Encounter (Signed)
Spoke to pt. He said he is feeling really good!!!! No pain in his leg. Back is a little tender.

## 2016-05-28 NOTE — Telephone Encounter (Signed)
Had cyst removed from around lumbar spine Nice to hear he did so well with the surgery

## 2016-07-02 ENCOUNTER — Ambulatory Visit (INDEPENDENT_AMBULATORY_CARE_PROVIDER_SITE_OTHER): Payer: Medicare Other | Admitting: Urgent Care

## 2016-07-02 VITALS — BP 144/82 | HR 62 | Temp 97.7°F | Resp 16 | Ht 67.75 in | Wt 193.8 lb

## 2016-07-02 DIAGNOSIS — I2510 Atherosclerotic heart disease of native coronary artery without angina pectoris: Secondary | ICD-10-CM

## 2016-07-02 DIAGNOSIS — I10 Essential (primary) hypertension: Secondary | ICD-10-CM

## 2016-07-02 DIAGNOSIS — E785 Hyperlipidemia, unspecified: Secondary | ICD-10-CM

## 2016-07-02 DIAGNOSIS — I2584 Coronary atherosclerosis due to calcified coronary lesion: Secondary | ICD-10-CM | POA: Diagnosis not present

## 2016-07-02 DIAGNOSIS — R112 Nausea with vomiting, unspecified: Secondary | ICD-10-CM

## 2016-07-02 DIAGNOSIS — R55 Syncope and collapse: Secondary | ICD-10-CM | POA: Diagnosis not present

## 2016-07-02 DIAGNOSIS — Z955 Presence of coronary angioplasty implant and graft: Secondary | ICD-10-CM | POA: Diagnosis not present

## 2016-07-02 LAB — POCT CBC
Granulocyte percent: 79.3 %G (ref 37–80)
HCT, POC: 43.6 % (ref 43.5–53.7)
Hemoglobin: 15.1 g/dL (ref 14.1–18.1)
LYMPH, POC: 1.2 (ref 0.6–3.4)
MCH, POC: 31.4 pg — AB (ref 27–31.2)
MCHC: 34.6 g/dL (ref 31.8–35.4)
MCV: 90.8 fL (ref 80–97)
MID (CBC): 0.6 (ref 0–0.9)
MPV: 7.1 fL (ref 0–99.8)
PLATELET COUNT, POC: 277 10*3/uL (ref 142–424)
POC Granulocyte: 6.8 (ref 2–6.9)
POC LYMPH PERCENT: 14.3 %L (ref 10–50)
POC MID %: 6.4 %M (ref 0–12)
RBC: 4.81 M/uL (ref 4.69–6.13)
RDW, POC: 14.5 %
WBC: 8.6 10*3/uL (ref 4.6–10.2)

## 2016-07-02 LAB — POCT URINALYSIS DIP (MANUAL ENTRY)
Bilirubin, UA: NEGATIVE
Blood, UA: NEGATIVE
GLUCOSE UA: NEGATIVE
Ketones, POC UA: NEGATIVE
LEUKOCYTES UA: NEGATIVE
NITRITE UA: NEGATIVE
SPEC GRAV UA: 1.025
UROBILINOGEN UA: 0.2
pH, UA: 7

## 2016-07-02 LAB — GLUCOSE, POCT (MANUAL RESULT ENTRY): POC GLUCOSE: 101 mg/dL — AB (ref 70–99)

## 2016-07-02 NOTE — Progress Notes (Signed)
     IF you received an x-ray today, you will receive an invoice from [facility]. Please contact [facility] at [phone] with questions or concerns regarding your invoice.   IF you received labwork today, you will receive an invoice from [facility]. Please contact [facility] at [phone] with questions or concerns regarding your invoice.   Our billing staff will not be able to assist you with questions regarding bills from these companies.  You will be contacted with the lab results as soon as they are available. The fastest way to get your results is to activate your My Chart account. Instructions are located on the last page of this paperwork. If you have not heard from us regarding the results in 2 weeks, please contact this office.     

## 2016-07-02 NOTE — Patient Instructions (Signed)
Syncope °Syncope is when you temporarily lose consciousness. Syncope may also be called fainting or passing out. It is caused by a sudden decrease in blood flow to the brain. Even though most causes of syncope are not dangerous, syncope can be a sign of a serious medical problem. Signs that you may be about to faint include: °· Feeling dizzy or light-headed. °· Feeling nauseous. °· Seeing all white or all black in your field of vision. °· Having cold, clammy skin. °If you fainted, get medical help right away.Call your local emergency services (911 in the [place] Do not drive yourself to the hospital. °Follow these instructions at home: °Pay attention to any changes in your symptoms. Take these actions to help with your condition: °· Have someone stay with you until you feel stable. °· Do not drive, use machinery, or play sports until your health care provider says it is okay. °· Keep all follow-up visits as told by your health care provider. This is important. °· If you start to feel like you might faint, lie down right away and raise (elevate) your feet above the level of your heart. Breathe deeply and steadily. Wait until all of the symptoms have passed. °· Drink enough fluid to keep your urine clear or pale yellow. °· If you are taking blood pressure or heart medicine, get up slowly and take several minutes to sit and then stand. This can reduce dizziness. °· Take over-the-counter and prescription medicines only as told by your health care provider. °Get help right away if: °· You have a severe headache. °· You have unusual pain in your chest, abdomen, or back. °· You are bleeding from your mouth or rectum, or you have black or tarry stool. °· You have a very fast or irregular heartbeat (palpitations). °· You have pain with breathing. °· You faint once or repeatedly. °· You have a seizure. °· You are confused. °· You have trouble walking. °· You have severe weakness. °· You have vision problems. °These symptoms  may represent a serious problem that is an emergency. Do not wait to see if your symptoms will go away. Get medical help right away. Call your local emergency services (911 in the [place] Do not drive yourself to the hospital. °This information is not intended to replace advice given to you by your health care provider. Make sure you discuss any questions you have with your health care provider. °Document Released: 04/29/2005 Document Revised: 10/05/2015 Document Reviewed: 01/11/2015 °Elsevier Interactive Patient Education © 2017 [employer] ° °

## 2016-07-02 NOTE — Progress Notes (Signed)
MRN: [id number] DOB: 1940-11-24  Subjective:   Patrick [name] is a 76 y.o. male presenting for chief complaint of Loss of Consciousness  Reports sudden onset of mild confusion, nausea with vomiting x1, syncope while sitting at table. Patient had just taken his medications for blood pressure. Denies headache, blurred vision, facial droop, dizziness, chest pain, shob, heart racing, palpitations, abdominal pain, weakness, numbness or tingling, diaphoresis, neck pain, jaw pain, limb pain. Denies smoking cigarettes, quit smoking 1980. He had 1 glass of wine last night. Patient has cardiologist, Dr. Cathie Olden. His last OV was 04/15/2016, had a normal visit with f/u recommended in 6 months. He has not used nitroglycerin ever. Has a history of 2 stents in 2007. 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    Patrick [name] has a current medication list which includes the following prescription(s): amlodipine, aspirin, co q-10, fish oil-omega-3 fatty acids, hydrocodone-acetaminophen, latanoprost, nitroglycerin, rosuvastatin, and valsartan-hydrochlorothiazide. Also is allergic to atorvastatin; ezetimibe-simvastatin; lisinopril; and pravastatin.  Patrick [name]  has a past medical history of Allergy; Aortic root dilatation (HCC); BPH (benign prostatic hypertrophy); Bradycardia; CAD (coronary artery disease); Colon polyps; Complication of anesthesia; Diverticulitis; GERD (gastroesophageal reflux disease); Glaucoma; Hyperlipidemia; Hypertension; Impaired fasting glucose; Sleep apnea; and Syncope. Also  has a past surgical history that includes Angioplasty; Pilonidal cyst / sinus excision; Intraocular lens insertion; left heart catheterization with coronary angiogram (N/A, 11/26/2011); Cataract extraction w/ intraocular lens  implant, bilateral (9/15); Colonoscopy; and Lumbar laminectomy/decompression microdiscectomy (Left, 05/27/2016).  Objective:   Vitals: BP (!) 144/82   Pulse 62   Temp 97.7 F (36.5 C)  (Oral)   Resp 16   Ht 5' 7.75" (1.721 m)   Wt 193 lb 12.8 oz (87.9 kg)   SpO2 96%   BMI 29.69 kg/m   BP Readings from Last 3 Encounters:  07/02/16 (!) 144/82  05/27/16 114/88  05/20/16 116/70    Physical Exam  Constitutional: He is oriented to person, place, and time. He appears well-developed and well-nourished.  HENT:  Mouth/Throat: Oropharynx is clear and moist.  Eyes: EOM are normal. Pupils are equal, round, and reactive to light.  Neck: Normal range of motion. Neck supple.  Cardiovascular: Normal rate, regular rhythm and intact distal pulses.  Exam reveals no gallop and no friction rub.   No murmur heard. Pulmonary/Chest: No respiratory distress. He has no wheezes. He has no rales.  Abdominal: Soft. Bowel sounds are normal. He exhibits no distension and no mass. There is no tenderness. There is no guarding.  Musculoskeletal: He exhibits no edema.  Neurological: He is alert and oriented to person, place, and time. No cranial nerve deficit. Coordination normal.  Skin: Skin is warm and dry.   ECG interpretation - nonspecific T-wave flattening in leads I-III, aVF V4-V6. Comparable to ecg from 05/20/2016, no acute changes.  Results for orders placed or performed in visit on 07/02/16 (from the past 24 hour(s))  POCT glucose (manual entry)     Status: Abnormal   Collection Time: 07/02/16 10:41 AM  Result Value Ref Range   POC Glucose 101 (A) 70 - 99 mg/dl  POCT CBC     Status: Abnormal   Collection Time: 07/02/16 10:42 AM  Result Value Ref Range   WBC 8.6 4.6 - 10.2 K/uL   Lymph, poc 1.2 0.6 - 3.4   POC LYMPH PERCENT 14.3 10 - 50 %L   MID (cbc) 0.6 0 - 0.9   POC MID % 6.4 0 - 12 %M   POC Granulocyte 6.8  2 - 6.9   Granulocyte percent 79.3 37 - 80 %G   RBC 4.81 4.69 - 6.13 M/uL   Hemoglobin 15.1 14.1 - 18.1 g/dL   HCT, POC 43.6 43.5 - 53.7 %   MCV 90.8 80 - 97 fL   MCH, POC 31.4 (A) 27 - 31.2 pg   MCHC 34.6 31.8 - 35.4 g/dL   RDW, POC 14.5 %   Platelet Count, POC 277  142 - 424 K/uL   MPV 7.1 0 - 99.8 fL  POCT urinalysis dipstick     Status: Abnormal   Collection Time: 07/02/16 11:08 AM  Result Value Ref Range   Color, UA yellow yellow   Clarity, UA clear clear   Glucose, UA negative negative   Bilirubin, UA negative negative   Ketones, POC UA negative negative   Spec Grav, UA 1.025    Blood, UA negative negative   pH, UA 7.0    Protein Ur, POC trace (A) negative   Urobilinogen, UA 0.2    Nitrite, UA Negative Negative   Leukocytes, UA Negative Negative   Assessment and Plan :   1. Syncope, unspecified syncope type 2. Nausea and vomiting, intractability of vomiting not specified, unspecified vomiting type 3. Essential hypertension 4. Hyperlipidemia, unspecified hyperlipidemia type 5. Coronary artery disease due to calcified coronary lesion 6. History of coronary artery stent placement - Physical exam findings, ecg and labs are reassuring against an acute process. I discussed warning signs of TIA, stroke, ACS. Patient verbalized understanding. Recommended patient hydrate better. Ambulatory referral to Cardiology is pending.   Patrick Eagles, PA-C [facility] [id number] 07/02/2016  10:11 AM

## 2016-07-03 LAB — BASIC METABOLIC PANEL
BUN/Creatinine Ratio: 20 (ref 10–24)
BUN: 20 mg/dL (ref 8–27)
CO2: 19 mmol/L (ref 18–29)
CREATININE: 1 mg/dL (ref 0.76–1.27)
Calcium: 9.8 mg/dL (ref 8.6–10.2)
Chloride: 98 mmol/L (ref 96–106)
GFR calc Af Amer: 85 (ref >59)
GFR, EST NON AFRICAN AMERICAN: 73 (ref >59)
GLUCOSE: 140 mg/dL — AB (ref 65–99)
Potassium: 4.2 mmol/L (ref 3.5–5.2)
Sodium: 140 mmol/L (ref 134–144)

## 2016-07-04 NOTE — Progress Notes (Signed)
Cardiology Office Note:    Date:  07/05/2016   ID:  Patrick [name], DOB 08-07-1940, MRN [id number]  PCP:  Viviana Simpler, MD  Cardiologist:  Dr. Liam Rogers   Electrophysiologist:  n/a  Referring MD: Venia Carbon, MD   Chief Complaint  Patient presents with  . Loss of Consciousness    History of Present Illness:    Patrick [name] is a 76 y.o. male with a hx of CAD status post prior PCI with Taxus DES to the RCA and PDA in 2006, HTN, HL, sleep apnea.  Last seen by Dr. [name] 12/17.  Since that time, he underwent lumbar laminectomy in 1/18 by Dr. Saintclair Halsted.  He was seen in urgent care 07/02/16 with syncope. He is referred back for further evaluation.  He is here alone. He tells me that he took his morning medications while sitting at the table. Shortly afterward, he was nauseated and vomited. He was told that he was unresponsive for a few seconds. He was lethargic and went back to bed. Family members told him he was hard to arouse later. He then went to urgent care. By the time he went to urgent care, he felt back to normal. He denies chest discomfort. He denies significant dyspnea. He denies orthopnea, PND or edema. He has not had any further episodes of dizziness, near syncope, syncope.  Prior CV studies:   The following studies were reviewed today:  Myoview 7/13 No ischemia, EF 59  Echo 7/13 EF 55-60, normal wall motion, grade 1 diastolic dysfunction, mildly dilated aortic root (39 mm), mild LAE  LHC 7/13 LAD 20-30 LCx irregularities-20 RCA proximal stent with minor irregularities, mid 30-40, PDA stent with 40-50 ISR EF 65  Past Medical History:  Diagnosis Date  . Allergy   . Aortic root dilatation (HCC)    Mild by echo 11/2011 (12mm)  . BPH (benign prostatic hypertrophy)   . Bradycardia    Precluding BB use.  Marland Kitchen CAD (coronary artery disease)    a. s/p Taxus DES to RCA and PDA in 2006 .b. Myoview 8/11: Walked 12:37. EF 57%. no ischemia or scar. c. Mod CAD by cath 11/2011  (40-50% ISR of PDA -- for OP myoview to assess).  . Colon polyps   . Complication of anesthesia    "they said they about lost, me when I had eye surgery"  . Diverticulitis   . GERD (gastroesophageal reflux disease)   . Glaucoma   . Hyperlipidemia   . Hypertension   . Impaired fasting glucose   . Sleep apnea   . Syncope     Past Surgical History:  Procedure Laterality Date  . ANGIOPLASTY     percutaneous transluminal coronary  angioplasty and stenting of the right coronary artery and posterior   . CATARACT EXTRACTION W/ INTRAOCULAR LENS  IMPLANT, BILATERAL  9/15  . COLONOSCOPY    . INTRAOCULAR LENS INSERTION    . LEFT HEART CATHETERIZATION WITH CORONARY ANGIOGRAM N/A 11/26/2011   Procedure: LEFT HEART CATHETERIZATION WITH CORONARY ANGIOGRAM;  Surgeon: Thayer Headings, MD;  Location: [facility] CATH LAB;  Service: Cardiovascular;  Laterality: N/A;  . LUMBAR LAMINECTOMY/DECOMPRESSION MICRODISCECTOMY Left 05/27/2016   Procedure: Laminectomy for facet/synovial cyst - Lumbar four - Lumbar five - left;  Surgeon: Kary Kos, MD;  Location: [place];  Service: Neurosurgery;  Laterality: Left;  Laminectomy for facet/synovial cyst - Lumbar four - Lumbar five - left  . PILONIDAL CYST / SINUS EXCISION  Current Medications: Current Meds  Medication Sig  . amLODipine (NORVASC) 5 MG tablet TAKE 1 TABLET BY MOUTH DAILY  . aspirin 81 MG tablet Take 81 mg by mouth daily.    . Coenzyme Q10 (CO Q-10) 100 MG CAPS Take 1 capsule by mouth daily.   . fish oil-omega-3 fatty acids 1000 MG capsule Take 1 g by mouth daily.   Marland Kitchen HYDROcodone-acetaminophen (NORCO/VICODIN) 5-325 MG tablet Take 1 tablet by mouth every 6 (six) hours as needed for moderate pain.  Marland Kitchen latanoprost (XALATAN) 0.005 % ophthalmic solution Place 1 drop into both eyes at bedtime.   . nitroGLYCERIN (NITROSTAT) 0.4 MG SL tablet Place 1 tablet (0.4 mg total) under the tongue every 5 (five) minutes x 3 doses as needed for chest pain.  . rosuvastatin  (CRESTOR) 20 MG tablet Take 0.5 tablets (10 mg total) by mouth daily.  . valsartan-hydrochlorothiazide (DIOVAN-HCT) 320-25 MG tablet Take 1 tablet by mouth daily.     Allergies:   Atorvastatin; Ezetimibe-simvastatin; Lisinopril; and Pravastatin   Social History   Social History  . Marital status: Divorced    Spouse name: N/A  . Number of children: 3  . Years of education: N/A   Occupational History  . driver part time Retired   Social History Main Topics  . Smoking status: Former Smoker    Years: 30.00    Quit date: 05/13/1978  . Smokeless tobacco: Never Used  . Alcohol use 2.4 oz/week    4 Glasses of wine per week     Comment: wine nightly (2-3 glasses) if not working  . Drug use: No  . Sexual activity: Yes   Other Topics Concern  . None   Social History Narrative   No living will   Daughter Benjamine Mola should make medical decisions if he can't   Would accept resuscitation   Wouldn't want prolonged tube feeds if cognitively unaware     Family History  Problem Relation Age of Onset  . Coronary artery disease Father   . Heart disease Father   . Heart failure Father   . Cancer Mother     ovarian cancer  . Hypertension Neg Hx   . Diabetes Neg Hx      ROS:   Please see the history of present illness.    ROS All other systems reviewed and are negative.   EKGs/Labs/Other Test Reviewed:    EKG:  EKG is not ordered today.  The ekg ordered today demonstrates n/a ECG from 07/02/16 personally reviewed: Sinus bradycardia, HR 55, PR 190, QTc 450, nonspecific ST-T wave changes-T wave inversions in V4-V5 (not present on prior ECGs)  Recent Labs: 05/20/2016: ALT 36 05/27/2016: Platelets 229 07/02/2016: BUN 20; Creatinine, Ser 1.00; Hemoglobin 15.1; Potassium 4.2; Sodium 140   Recent Lipid Panel    Component Value Date/Time   CHOL 147 04/12/2016 0823   CHOL 171 02/18/2014 0933   TRIG 76 04/12/2016 0823   HDL 51 04/12/2016 0823   HDL 42 02/18/2014 0933   CHOLHDL 2.9  04/12/2016 0823   VLDL 15 04/12/2016 0823   LDLCALC 81 04/12/2016 0823   LDLCALC 106 (H) 02/18/2014 0933   LDLDIRECT 125.7 07/01/2011 1134     Physical Exam:    VS:  BP (!) 150/62   Pulse 64   Ht 5\' 7"  (1.702 m)   Wt 193 lb 1.9 oz (87.6 kg)   BMI 30.25 kg/m     Wt Readings from Last 3 Encounters:  07/05/16 193 lb 1.9  oz (87.6 kg)  07/02/16 193 lb 12.8 oz (87.9 kg)  05/27/16 194 lb (88 kg)     Physical Exam  Constitutional: He is oriented to person, place, and time. He appears well-developed and well-nourished. No distress.  HENT:  Head: Normocephalic and atraumatic.  Eyes: No scleral icterus.  Neck: Normal range of motion. No JVD present. Carotid bruit is not present.  Cardiovascular: Normal rate, regular rhythm, S1 normal and S2 normal.   No murmur heard. Pulmonary/Chest: Effort normal and breath sounds normal. He has no wheezes. He has no rhonchi. He has no rales.  Abdominal: Soft. There is no tenderness.  Musculoskeletal: He exhibits no edema.  Neurological: He is alert and oriented to person, place, and time.  Skin: Skin is warm and dry.  Psychiatric: He has a normal mood and affect.    ASSESSMENT:    1. Syncope, unspecified syncope type   2. Coronary artery disease involving native coronary artery of native heart without angina pectoris   3. Essential hypertension   4. Hyperlipidemia, unspecified hyperlipidemia type    PLAN:    In order of problems listed above:  1. Syncope - His symptoms are most consistent with vasovagal syncope. He is bradycardic but seems asymptomatic.  His ECG demonstrates subtle TWI in V4-5 which is new when compared to prior ECGs. It has been 5 years since his last assessment for ischemia. He denies any further symptoms since the initial event. I do not suspect anything other than vasovagal syncope. However, I will obtain a Myoview as it has been a significant amount of time since his last assessment for ischemia as well as to assess his  LV function.  -  Arrange exercise Myoview    -  Arrange 24-hour Holter to rule out significant bradycardia arrhythmias  2. CAD - Status post prior stenting to the RCA and PDA in 2006. He had a nonischemic Myoview in 7/13. As noted, given his recent episode of syncope, a Myoview will be obtained. Continue aspirin, statin.  3. HTN - His blood pressure runs high in the doctor's office. Pressures at home have been optimal.  4. HL - Continue statin.  LDL in 12/17 was 81.   Medication Adjustments/Labs and Tests Ordered: Current medicines are reviewed at length with the patient today.  Concerns regarding medicines are outlined above.  Medication changes, Labs and Tests ordered today are outlined in the Patient Instructions noted below. Patient Instructions  Medication Instructions:  1. Your physician recommends that you continue on your current medications as directed. Please refer to the Current Medication list given to you today.  Labwork: NONE  Testing/Procedures: 1. Your physician has requested that you have en exercise stress myoview. For further information please visit [email or web address]. Please follow instruction sheet, as given.  2. Your physician has recommended that you wear a 24 HOUR holter monitor. Holter monitors are medical devices that record the heart's electrical activity. Doctors most often use these monitors to diagnose arrhythmias. Arrhythmias are problems with the speed or rhythm of the heartbeat. The monitor is a small, portable device. You can wear one while you do your normal daily activities. This is usually used to diagnose what is causing palpitations/syncope (passing out).  Follow-Up: DR. [name] [place] 10/2016  Any Other Special Instructions Will Be Listed Below (If Applicable).  If you need a refill on your cardiac medications before your next appointment, please call your pharmacy.   Return in about 4 months (around 11/02/2016) for Scheduled Follow Up, w/  Dr.  [name].   Signed, Richardson Dopp, PA-C  07/05/2016 8:48 AM    [facility] [place], [place],   [address] Phone: [phone]; Fax: [phone]

## 2016-07-05 ENCOUNTER — Ambulatory Visit (INDEPENDENT_AMBULATORY_CARE_PROVIDER_SITE_OTHER): Payer: Medicare Other | Admitting: Physician Assistant

## 2016-07-05 ENCOUNTER — Encounter: Payer: Self-pay | Admitting: Physician Assistant

## 2016-07-05 VITALS — BP 150/62 | HR 64 | Ht 67.0 in | Wt 193.1 lb

## 2016-07-05 DIAGNOSIS — E785 Hyperlipidemia, unspecified: Secondary | ICD-10-CM

## 2016-07-05 DIAGNOSIS — I2510 Atherosclerotic heart disease of native coronary artery without angina pectoris: Secondary | ICD-10-CM | POA: Diagnosis not present

## 2016-07-05 DIAGNOSIS — I10 Essential (primary) hypertension: Secondary | ICD-10-CM | POA: Diagnosis not present

## 2016-07-05 DIAGNOSIS — R55 Syncope and collapse: Secondary | ICD-10-CM

## 2016-07-05 NOTE — Patient Instructions (Addendum)
Medication Instructions:  1. Your physician recommends that you continue on your current medications as directed. Please refer to the Current Medication list given to you today.  Labwork: NONE  Testing/Procedures: 1. Your physician has requested that you have en exercise stress myoview. For further information please visit [email or web address]. Please follow instruction sheet, as given.  2. Your physician has recommended that you wear a 24 HOUR holter monitor. Holter monitors are medical devices that record the heart's electrical activity. Doctors most often use these monitors to diagnose arrhythmias. Arrhythmias are problems with the speed or rhythm of the heartbeat. The monitor is a small, portable device. You can wear one while you do your normal daily activities. This is usually used to diagnose what is causing palpitations/syncope (passing out).  Follow-Up: DR. [name] IN 10/2016  Any Other Special Instructions Will Be Listed Below (If Applicable).  If you need a refill on your cardiac medications before your next appointment, please call your pharmacy.

## 2016-07-09 ENCOUNTER — Telehealth (HOSPITAL_COMMUNITY): Payer: Self-pay | Admitting: *Deleted

## 2016-07-09 NOTE — Telephone Encounter (Signed)
Patient given detailed instructions per Myocardial Perfusion Study Information Sheet for the test on 07/11/16 at 0730. Patient notified to arrive 15 minutes early and that it is imperative to arrive on time for appointment to keep from having the test rescheduled.  If you need to cancel or reschedule your appointment, please call the office within 24 hours of your appointment. Failure to do so may result in a cancellation of your appointment, and a $50 no show fee. Patient verbalized understanding.Tia Gelb, Ranae Palms

## 2016-07-11 ENCOUNTER — Ambulatory Visit (HOSPITAL_COMMUNITY): Payer: Medicare Other | Attending: Cardiology

## 2016-07-11 ENCOUNTER — Ambulatory Visit (INDEPENDENT_AMBULATORY_CARE_PROVIDER_SITE_OTHER): Payer: Medicare Other

## 2016-07-11 ENCOUNTER — Encounter: Payer: Self-pay | Admitting: Physician Assistant

## 2016-07-11 DIAGNOSIS — I10 Essential (primary) hypertension: Secondary | ICD-10-CM | POA: Diagnosis not present

## 2016-07-11 DIAGNOSIS — I2510 Atherosclerotic heart disease of native coronary artery without angina pectoris: Secondary | ICD-10-CM | POA: Insufficient documentation

## 2016-07-11 DIAGNOSIS — R55 Syncope and collapse: Secondary | ICD-10-CM | POA: Diagnosis not present

## 2016-07-11 LAB — MYOCARDIAL PERFUSION IMAGING
CHL CUP MPHR: 145 {beats}/min
CHL CUP NUCLEAR SRS: 3
CHL CUP RESTING HR STRESS: 60 {beats}/min
CSEPED: 10 min
CSEPEDS: 30 s
CSEPEW: 12.3 METS
CSEPPHR: 137 {beats}/min
LHR: 0.27
LV dias vol: 105 mL (ref 62–150)
LV sys vol: 44 mL
Percent HR: 94 %
SDS: 0
SSS: 3
TID: 0.87

## 2016-07-11 MED ORDER — TECHNETIUM TC 99M TETROFOSMIN IV KIT
10.1000 | PACK | Freq: Once | INTRAVENOUS | Status: AC | PRN
  Administered 2016-07-11: 10.1 via INTRAVENOUS
  Filled 2016-07-11: qty 11

## 2016-07-11 MED ORDER — TECHNETIUM TC 99M TETROFOSMIN IV KIT
32.8000 | PACK | Freq: Once | INTRAVENOUS | Status: AC | PRN
  Administered 2016-07-11: 32.8 via INTRAVENOUS
  Filled 2016-07-11: qty 33

## 2016-07-12 ENCOUNTER — Telehealth: Payer: Self-pay | Admitting: *Deleted

## 2016-07-12 NOTE — Telephone Encounter (Signed)
Pt notified of Myoview results by phone with verbal understanding. Pt aware to continue current Tx plan. I will send a copy of results to PCP as well. Pt request a copy of results to be sent to him. I verified mailing address. Pt states he will be dropping off monitor today to our office. Pt and I agree pt will pick up results at the front desk.

## 2016-08-26 ENCOUNTER — Ambulatory Visit: Payer: Medicare Other

## 2016-10-15 DIAGNOSIS — M7138 Other bursal cyst, other site: Secondary | ICD-10-CM | POA: Diagnosis not present

## 2016-10-15 DIAGNOSIS — M5416 Radiculopathy, lumbar region: Secondary | ICD-10-CM | POA: Diagnosis not present

## 2016-10-22 DIAGNOSIS — M5416 Radiculopathy, lumbar region: Secondary | ICD-10-CM | POA: Diagnosis not present

## 2016-10-23 DIAGNOSIS — M5116 Intervertebral disc disorders with radiculopathy, lumbar region: Secondary | ICD-10-CM | POA: Diagnosis not present

## 2016-10-23 DIAGNOSIS — M48061 Spinal stenosis, lumbar region without neurogenic claudication: Secondary | ICD-10-CM | POA: Diagnosis not present

## 2016-10-23 DIAGNOSIS — M5416 Radiculopathy, lumbar region: Secondary | ICD-10-CM | POA: Diagnosis not present

## 2016-10-25 DIAGNOSIS — H401132 Primary open-angle glaucoma, bilateral, moderate stage: Secondary | ICD-10-CM | POA: Diagnosis not present

## 2016-10-25 DIAGNOSIS — H04123 Dry eye syndrome of bilateral lacrimal glands: Secondary | ICD-10-CM | POA: Diagnosis not present

## 2016-10-28 DIAGNOSIS — M5416 Radiculopathy, lumbar region: Secondary | ICD-10-CM | POA: Diagnosis not present

## 2016-10-31 ENCOUNTER — Encounter: Payer: Self-pay | Admitting: Cardiovascular Disease

## 2016-10-31 ENCOUNTER — Encounter (INDEPENDENT_AMBULATORY_CARE_PROVIDER_SITE_OTHER): Payer: Self-pay

## 2016-10-31 ENCOUNTER — Ambulatory Visit (INDEPENDENT_AMBULATORY_CARE_PROVIDER_SITE_OTHER): Payer: Medicare Other | Admitting: Cardiovascular Disease

## 2016-10-31 ENCOUNTER — Other Ambulatory Visit: Payer: Self-pay

## 2016-10-31 VITALS — BP 146/74 | HR 62 | Ht 67.0 in | Wt 187.4 lb

## 2016-10-31 DIAGNOSIS — I10 Essential (primary) hypertension: Secondary | ICD-10-CM

## 2016-10-31 DIAGNOSIS — I2510 Atherosclerotic heart disease of native coronary artery without angina pectoris: Secondary | ICD-10-CM | POA: Diagnosis not present

## 2016-10-31 LAB — LIPID PANEL
CHOLESTEROL TOTAL: 157 mg/dL (ref 100–199)
Chol/HDL Ratio: 3.4 ratio (ref 0.0–5.0)
HDL: 46 mg/dL (ref >39)
LDL CALC: 80 mg/dL (ref 0–99)
Triglycerides: 155 mg/dL — ABNORMAL HIGH (ref 0–149)
VLDL CHOLESTEROL CAL: 31 mg/dL (ref 5–40)

## 2016-10-31 LAB — COMPREHENSIVE METABOLIC PANEL
ALBUMIN: 4.5 g/dL (ref 3.5–4.8)
ALT: 28 IU/L (ref 0–44)
AST: 19 IU/L (ref 0–40)
Albumin/Globulin Ratio: 1.9 (ref 1.2–2.2)
Alkaline Phosphatase: 105 IU/L (ref 39–117)
BILIRUBIN TOTAL: 0.6 mg/dL (ref 0.0–1.2)
BUN / CREAT RATIO: 22 (ref 10–24)
BUN: 26 mg/dL (ref 8–27)
CO2: 21 mmol/L (ref 20–29)
Calcium: 9.6 mg/dL (ref 8.6–10.2)
Chloride: 102 mmol/L (ref 96–106)
Creatinine, Ser: 1.18 mg/dL (ref 0.76–1.27)
GFR calc Af Amer: 69 mL/min/{1.73_m2} (ref >59)
GFR calc non Af Amer: 60 mL/min/{1.73_m2} (ref >59)
GLOBULIN, TOTAL: 2.4 g/dL (ref 1.5–4.5)
Glucose: 97 mg/dL (ref 65–99)
Potassium: 4.5 mmol/L (ref 3.5–5.2)
SODIUM: 139 mmol/L (ref 134–144)
Total Protein: 6.9 g/dL (ref 6.0–8.5)

## 2016-10-31 NOTE — Patient Instructions (Addendum)
Medication Instructions:  Your physician recommends that you continue on your current medications as directed. Please refer to the Current Medication list given to you today.   Labwork: TODAY - cholesterol, complete metabolic panel   Testing/Procedures: None Ordered   Follow-Up: Your physician wants you to follow-up in: 6 months with Dr. [name].  You will receive a reminder letter in the mail two months in advance. If you don't receive a letter, please call our office to schedule the follow-up appointment.   If you need a refill on your cardiac medications before your next appointment, please call your pharmacy.   Thank you for choosing [facility]! Christen Bame, RN [phone]    Low-Sodium Eating Plan Sodium, which is an element that makes up salt, helps you maintain a healthy balance of fluids in your body. Too much sodium can increase your blood pressure and cause fluid and waste to be held in your body. Your health care provider or dietitian may recommend following this plan if you have high blood pressure (hypertension), kidney disease, liver disease, or heart failure. Eating less sodium can help lower your blood pressure, reduce swelling, and protect your heart, liver, and kidneys. What are tips for following this plan? General guidelines  Most people on this plan should limit their sodium intake to 1,500-2,000 mg (milligrams) of sodium each day. Reading food labels  The Nutrition Facts label lists the amount of sodium in one serving of the food. If you eat more than one serving, you must multiply the listed amount of sodium by the number of servings.  Choose foods with less than 140 mg of sodium per serving.  Avoid foods with 300 mg of sodium or more per serving. Shopping  Look for lower-sodium products, often labeled as "low-sodium" or "no salt added."  Always check the sodium content even if foods are labeled as "unsalted" or "no salt added".  Buy fresh  foods. ? Avoid canned foods and premade or frozen meals. ? Avoid canned, cured, or processed meats  Buy breads that have less than 80 mg of sodium per slice. Cooking  Eat more home-cooked food and less restaurant, buffet, and fast food.  Avoid adding salt when cooking. Use salt-free seasonings or herbs instead of table salt or sea salt. Check with your health care provider or pharmacist before using salt substitutes.  Cook with plant-based oils, such as canola, sunflower, or olive oil. Meal planning  When eating at a restaurant, ask that your food be prepared with less salt or no salt, if possible.  Avoid foods that contain MSG (monosodium glutamate). MSG is sometimes added to [place] food, bouillon, and some canned foods. What foods are recommended? The items listed may not be a complete list. Talk with your dietitian about what dietary choices are best for you. Grains Low-sodium cereals, including oats, puffed wheat and rice, and shredded wheat. Low-sodium crackers. Unsalted rice. Unsalted pasta. Low-sodium bread. Whole-grain breads and whole-grain pasta. Vegetables Fresh or frozen vegetables. "No salt added" canned vegetables. "No salt added" tomato sauce and paste. Low-sodium or reduced-sodium tomato and vegetable juice. Fruits Fresh, frozen, or canned fruit. Fruit juice. Meats and other protein foods Fresh or frozen (no salt added) meat, poultry, seafood, and fish. Low-sodium canned tuna and salmon. Unsalted nuts. Dried peas, beans, and lentils without added salt. Unsalted canned beans. Eggs. Unsalted nut butters. Dairy Milk. Soy milk. Cheese that is naturally low in sodium, such as ricotta cheese, fresh mozzarella, or Swiss cheese Low-sodium or reduced-sodium cheese.  Cream cheese. Yogurt. Fats and oils Unsalted butter. Unsalted margarine with no trans fat. Vegetable oils such as canola or olive oils. Seasonings and other foods Fresh and dried herbs and spices. Salt-free  seasonings. Low-sodium mustard and ketchup. Sodium-free salad dressing. Sodium-free light mayonnaise. Fresh or refrigerated horseradish. Lemon juice. Vinegar. Homemade, reduced-sodium, or low-sodium soups. Unsalted popcorn and pretzels. Low-salt or salt-free chips. What foods are not recommended? The items listed may not be a complete list. Talk with your dietitian about what dietary choices are best for you. Grains Instant hot cereals. Bread stuffing, pancake, and biscuit mixes. Croutons. Seasoned rice or pasta mixes. Noodle soup cups. Boxed or frozen macaroni and cheese. Regular salted crackers. Self-rising flour. Vegetables Sauerkraut, pickled vegetables, and relishes. Olives. [place] fries. Onion rings. Regular canned vegetables (not low-sodium or reduced-sodium). Regular canned tomato sauce and paste (not low-sodium or reduced-sodium). Regular tomato and vegetable juice (not low-sodium or reduced-sodium). Frozen vegetables in sauces. Meats and other protein foods Meat or fish that is salted, canned, smoked, spiced, or pickled. Bacon, ham, sausage, hotdogs, corned beef, chipped beef, packaged lunch meats, salt pork, jerky, pickled herring, anchovies, regular canned tuna, sardines, salted nuts. Dairy Processed cheese and cheese spreads. Cheese curds. Blue cheese. Feta cheese. String cheese. Regular cottage cheese. Buttermilk. Canned milk. Fats and oils Salted butter. Regular margarine. Ghee. Bacon fat. Seasonings and other foods Onion salt, garlic salt, seasoned salt, table salt, and sea salt. Canned and packaged gravies. Worcestershire sauce. Tartar sauce. Barbecue sauce. Teriyaki sauce. Soy sauce, including reduced-sodium. Steak sauce. Fish sauce. Oyster sauce. Cocktail sauce. Horseradish that you find on the shelf. Regular ketchup and mustard. Meat flavorings and tenderizers. Bouillon cubes. Hot sauce and Tabasco sauce. Premade or packaged marinades. Premade or packaged taco seasonings. Relishes.  Regular salad dressings. Salsa. Potato and tortilla chips. Corn chips and puffs. Salted popcorn and pretzels. Canned or dried soups. Pizza. Frozen entrees and pot pies. Summary  Eating less sodium can help lower your blood pressure, reduce swelling, and protect your heart, liver, and kidneys.  Most people on this plan should limit their sodium intake to 1,500-2,000 mg (milligrams) of sodium each day.  Canned, boxed, and frozen foods are high in sodium. Restaurant foods, fast foods, and pizza are also very high in sodium. You also get sodium by adding salt to food.  Try to cook at home, eat more fresh fruits and vegetables, and eat less fast food, canned, processed, or prepared foods. This information is not intended to replace advice given to you by your health care provider. Make sure you discuss any questions you have with your health care provider. Document Released: 10/19/2001 Document Revised: 04/22/2016 Document Reviewed: 04/22/2016 Elsevier Interactive Patient Education  2017 [employer].

## 2016-10-31 NOTE — Progress Notes (Signed)
Hosie Poisson Fair Date of Birth  [birth year]       [facility]    [employer] [address], [address], [place], [place]  [address]   [place], [place]  [address] [phone]     [phone]   Fax  [phone]    Fax [phone]  Problem List: 1. Coronary artery disease-moderate.  He's had a previous stent placed. 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 A subsequent Myoview study was normal. 2. Hypertension 3. Hyperlipidemia 4. Sleep apnea     Mr. Cullipher is doing very will.   He has been on a mission trip and to [employer].  No further episodes of chest pain.   He hauled 5 loads of hogs last week - his brother raises the hogs.  He is planning on going to [place] over Christmas.  He still does his lawn care business.  He walks at the [employer] - 3 miles a day.  Without symptoms.  November 09, 2012:  Damiel is doing well.  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   He is a retired Administrator.   Still walking 3 days a week.    Oct. 9, 2015:  Still very active - mows 8-9 yards a week.  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  No angina. BP is high. Has had a cold - is taking chlorpheneramine  Has had normal Bp at home   Sep 19, 2014:   Still very active.  Drives a truck.  Mows and trims 10 yards a week without any problems.  Has atypical  chest pain with mental stress but never with physical exertion.  Does not restrict his diet.   Nov. 18, 2016  Still working hard ( driving a truck and mowing yards)  Gets more short of breath compared to several years ago  Still eats some salty foods.   Has a lady who is cooking for him  - knows that she puts lots of salt in the food  October 16, 2015:  Demauri is seen back for his CAD, HTN, HLD . OSA  Just got back from a cruise in [place] .  Still very active.   Mows yards, still drives trucks.     Dec. 4, 2017:  Gresham is seen today with  [facility]. Still drives his truck and mows regularly .   No real CP or dyspnea.   Very rare episodes of chest tightness.  Keeps his BP at home.  Seems to go up when he comes to the doctor .   October 31, 2016:  Alvester is seen today for follow up  Had back surgery in January .   Back pain has returned . Still very busy,   Mows frequently .   Has a [employer] that he loves but seems to contribute to his back pain.   No angnia  BP at home has been mildly elevated.   Admits that he eats more salt than he should .   Current Outpatient Prescriptions on File Prior to Visit  Medication Sig Dispense Refill  . amLODipine (NORVASC) 5 MG tablet TAKE 1 TABLET BY MOUTH DAILY 90 tablet 3  . aspirin 81 MG tablet Take 81 mg by mouth daily.      . Coenzyme Q10 (CO Q-10) 100 MG CAPS Take 2 capsules by mouth daily.     . fish oil-omega-3  fatty acids 1000 MG capsule Take 1 g by mouth daily.     Marland Kitchen HYDROcodone-acetaminophen (NORCO/VICODIN) 5-325 MG tablet Take 1 tablet by mouth every 6 (six) hours as needed for moderate pain.    Marland Kitchen latanoprost (XALATAN) 0.005 % ophthalmic solution Place 1 drop into both eyes at bedtime.     . nitroGLYCERIN (NITROSTAT) 0.4 MG SL tablet Place 1 tablet (0.4 mg total) under the tongue every 5 (five) minutes x 3 doses as needed for chest pain. 25 tablet 6  . rosuvastatin (CRESTOR) 20 MG tablet Take 0.5 tablets (10 mg total) by mouth daily. 45 tablet 3  . valsartan-hydrochlorothiazide (DIOVAN-HCT) 320-25 MG tablet Take 1 tablet by mouth daily. 90 tablet 3   No current facility-administered medications on file prior to visit.     Allergies  Allergen Reactions  . Atorvastatin Other (See Comments)    MYALGIAS, LEG CRAMPING  . Ezetimibe-Simvastatin Other (See Comments)    MYALGIAS, CRAMPING  . Lisinopril Cough  . Pravastatin Other (See Comments)    Myalgias at 40mg  dosage    Past Medical History:  Diagnosis Date  . Allergy   . Aortic root dilatation (HCC)    Mild by echo  11/2011 (21mm)  . BPH (benign prostatic hypertrophy)   . Bradycardia    Precluding BB use.  Marland Kitchen CAD (coronary artery disease)    a. s/p Taxus DES to RCA and PDA in 2006 .  // b. Myoview 8/11: Walked 12:37. EF 57%. no ischemia or scar.  // c. Mod CAD by cath 11/2011 (40-50% ISR of PDA -- for OP myoview to assess).  //  d. Myoview 3/18: EF 58, no ischemia; Low Risk  . Colon polyps   . Complication of anesthesia    "they said they about lost, me when I had eye surgery"  . Diverticulitis   . GERD (gastroesophageal reflux disease)   . Glaucoma   . Hyperlipidemia   . Hypertension   . Impaired fasting glucose   . Sleep apnea   . Syncope     Past Surgical History:  Procedure Laterality Date  . ANGIOPLASTY     percutaneous transluminal coronary  angioplasty and stenting of the right coronary artery and posterior   . CATARACT EXTRACTION W/ INTRAOCULAR LENS  IMPLANT, BILATERAL  9/15  . COLONOSCOPY    . INTRAOCULAR LENS INSERTION    . LEFT HEART CATHETERIZATION WITH CORONARY ANGIOGRAM N/A 11/26/2011   Procedure: LEFT HEART CATHETERIZATION WITH CORONARY ANGIOGRAM;  Surgeon: Thayer Headings, MD;  Location[facility] CATH LAB;  Service: Cardiovascular;  Laterality: N/A;  . LUMBAR LAMINECTOMY/DECOMPRESSION MICRODISCECTOMY Left 05/27/2016   Procedure: Laminectomy for facet/synovial cyst - Lumbar four - Lumbar five - left;  Surgeon: Kary Kos, MD;  Location: [facility];  Service: Neurosurgery;  Laterality: Left;  Laminectomy for facet/synovial cyst - Lumbar four - Lumbar five - left  . PILONIDAL CYST / SINUS EXCISION      History  Smoking Status  . Former Smoker  . Years: 30.00  . Quit date: 05/13/1978  Smokeless Tobacco  . Never Used    History  Alcohol Use  . 2.4 oz/week  . 4 Glasses of wine per week    Comment: wine nightly (2-3 glasses) if not working    Family History  Problem Relation Age of Onset  . Coronary artery disease Father   . Heart disease Father   . Heart failure Father   . Cancer  Mother  ovarian cancer  . Hypertension Neg Hx   . Diabetes Neg Hx     Reviw of Systems:  Reviewed in the HPI.  All other systems are negative.  Physical Exam: Blood pressure (!) 146/74, pulse 62, height 5\' 7"  (1.702 m), weight 187 lb 6.4 oz (85 kg), SpO2 96 %. General: Well developed, well nourished, in no acute distress.  Head: Normocephalic, atraumatic, sclera non-icteric, mucus membranes are moist,   Neck: Supple. Carotids are 2 + without bruits. No JVD   Lungs: Clear   Heart: RR, normal S1 ,S2  Abdomen: Soft, non-tender, non-distended with normal bowel sounds.  Msk:  Strength and tone are normal   Extremities: No clubbing or cyanosis. No edema.  Distal pedal pulses are 2+ and equal    Neuro: CN II - XII intact.  Alert and oriented X 3.   Psych:  Normal   ECG: Sinus bradycardia 55 beats a minute. PACs .   He has no ST or T wave changes.   Assessment / Plan:   1. Coronary artery disease-moderate.  He's had a previous stent placed. 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 A subsequent Myoview study was normal. He's not had any further episodes of angina. Continue current medications. We'll check fasting labs today. I'll see him again in 6 months .   2. Hypertension- BP is on the higher side.  Still eats lots of salt.   Encouraged him to decrease his salt intake    We discussed cutting back on salty foods vs. Increasing Amlodipine to 10 mg a day .  He is already on max dose Valsartan and HCTZ.. Could consider adding coreg or bystolic at next visit also    3. Hyperlipidemia-  He is tolerating the Crestor . Labs are good.     Recheck in  6 months   4. Sleep apnea   Mertie Moores, MD  10/31/2016 8:09 AM    [facility] [place],  [place], [place]  [address] Pager [phone] Phone: [phone]; Fax: [phone]

## 2016-11-04 DIAGNOSIS — M4726 Other spondylosis with radiculopathy, lumbar region: Secondary | ICD-10-CM | POA: Diagnosis not present

## 2016-11-05 ENCOUNTER — Other Ambulatory Visit: Payer: Self-pay | Admitting: *Deleted

## 2016-11-05 DIAGNOSIS — L218 Other seborrheic dermatitis: Secondary | ICD-10-CM | POA: Diagnosis not present

## 2016-11-05 DIAGNOSIS — L570 Actinic keratosis: Secondary | ICD-10-CM | POA: Diagnosis not present

## 2016-11-05 DIAGNOSIS — L821 Other seborrheic keratosis: Secondary | ICD-10-CM | POA: Diagnosis not present

## 2016-11-05 DIAGNOSIS — L814 Other melanin hyperpigmentation: Secondary | ICD-10-CM | POA: Diagnosis not present

## 2016-11-05 DIAGNOSIS — D225 Melanocytic nevi of trunk: Secondary | ICD-10-CM | POA: Diagnosis not present

## 2016-11-05 MED ORDER — ROSUVASTATIN CALCIUM 20 MG PO TABS: 10.0000 mg | ORAL_TABLET | Freq: Every day | ORAL | 3 refills | Status: DC

## 2016-11-29 ENCOUNTER — Telehealth: Payer: Self-pay | Admitting: Cardiovascular Disease

## 2016-11-29 DIAGNOSIS — I10 Essential (primary) hypertension: Secondary | ICD-10-CM

## 2016-11-29 MED ORDER — OLMESARTAN MEDOXOMIL-HCTZ 40-25 MG PO TABS: 1.0000 | ORAL_TABLET | Freq: Every day | ORAL | 3 refills | Status: DC

## 2016-11-29 NOTE — Telephone Encounter (Signed)
OK to switch to Olmasartan / HCTZ 40/25  Check BMP in 2-3 weeks Have him record BP regularly

## 2016-11-29 NOTE — Telephone Encounter (Signed)
°  New Prob  States her BP medication is being taken off the market. Calling to speak to a nurse regarding an alternative. Please call.

## 2016-11-29 NOTE — Telephone Encounter (Signed)
Patient is not interested in taking this medication from a manufacture that the medication has not been recalled. Patient wants to switch to another medication. Patient is taking Valsartan-HCT 320/25 mg, per protocol patient needs to switch to olmesartan HCTZ 40/25. Will send to patient's pharmacy of choice. Instructed patient to check BP 2 to 3 times weekly for 2 weeks to make sure BP is being regulated with medication. Asked patient to give our office a call back if he has any other questions.

## 2016-12-04 NOTE — Telephone Encounter (Signed)
Spoke with patient to come in to have lab work done on 12/23/16 and to start recording BP reading. Patient stated that he will switch medication on 12/07/16. Patient verbalized understanding of plan.

## 2016-12-23 ENCOUNTER — Other Ambulatory Visit: Payer: Medicare Other | Admitting: *Deleted

## 2016-12-23 DIAGNOSIS — I10 Essential (primary) hypertension: Secondary | ICD-10-CM | POA: Diagnosis not present

## 2016-12-23 LAB — BASIC METABOLIC PANEL
BUN / CREAT RATIO: 21 (ref 10–24)
BUN: 26 mg/dL (ref 8–27)
CO2: 23 mmol/L (ref 20–29)
CREATININE: 1.26 mg/dL (ref 0.76–1.27)
Calcium: 9.5 mg/dL (ref 8.6–10.2)
Chloride: 104 mmol/L (ref 96–106)
GFR calc Af Amer: 64 mL/min/{1.73_m2} (ref >59)
GFR, EST NON AFRICAN AMERICAN: 55 mL/min/{1.73_m2} — AB (ref >59)
GLUCOSE: 115 mg/dL — AB (ref 65–99)
Potassium: 4.6 mmol/L (ref 3.5–5.2)
SODIUM: 141 mmol/L (ref 134–144)

## 2016-12-25 ENCOUNTER — Telehealth: Payer: Self-pay | Admitting: Cardiovascular Disease

## 2016-12-25 NOTE — Telephone Encounter (Signed)
Agree with note by Christen Bame, RN to cut the Benicar in 1/2 for now to see if that helps with the dizziness

## 2016-12-25 NOTE — Telephone Encounter (Signed)
Patient calling, states that he is returning a call for results °

## 2016-12-25 NOTE — Telephone Encounter (Signed)
Spoke with patient who called to say that he received the message about the lab results but he is concerned about low BP. He states since switching to Benicar HCT his BP readings are 116/71 mmHg, 126/71 mmHg, and 119/65 mmHg and he states that he has noticed episodes of light-headedness on several occasions recently. He states prior to making the change in medication, his BP readings were 829-562 mmHg systolic over 13-08 mmHg diastolic. I advised him to try taking 1/2 of his Benicar/HCT and continue to monitor BP and HR. I advised him to call back next week to report how he is feeling. He verbalized understanding and agreement and thanked me for the call.

## 2017-01-09 ENCOUNTER — Ambulatory Visit (INDEPENDENT_AMBULATORY_CARE_PROVIDER_SITE_OTHER): Payer: Medicare Other | Admitting: Internal Medicine

## 2017-01-09 ENCOUNTER — Encounter: Payer: Self-pay | Admitting: Internal Medicine

## 2017-01-09 ENCOUNTER — Telehealth: Payer: Self-pay | Admitting: Internal Medicine

## 2017-01-09 VITALS — BP 138/70 | HR 50 | Temp 98.0°F | Wt 196.0 lb

## 2017-01-09 DIAGNOSIS — S80861A Insect bite (nonvenomous), right lower leg, initial encounter: Secondary | ICD-10-CM

## 2017-01-09 DIAGNOSIS — S30860A Insect bite (nonvenomous) of lower back and pelvis, initial encounter: Secondary | ICD-10-CM

## 2017-01-09 DIAGNOSIS — S80862A Insect bite (nonvenomous), left lower leg, initial encounter: Secondary | ICD-10-CM

## 2017-01-09 DIAGNOSIS — W57XXXA Bitten or stung by nonvenomous insect and other nonvenomous arthropods, initial encounter: Secondary | ICD-10-CM

## 2017-01-09 MED ORDER — PREDNISONE 20 MG PO TABS: ORAL_TABLET | ORAL | 0 refills | Status: DC

## 2017-01-09 NOTE — Progress Notes (Signed)
Subjective:    Patient ID: Patrick [name], male    DOB: 13-Aug-1940, 76 y.o.   MRN: [id number]  HPI  Pt presents to the clinic today with c/o rash. He noticed this 3 days ago. He reports the rash is on his lower legs, feet, groin and back. The rash is very itchy. He thinks it may be chigger bites. He denies changes in soaps, lotions or detergents. He has tried Benadryl cream, Hydrocortisone cream and alcohol without any relief.   Review of Systems      Past Medical History:  Diagnosis Date  . Allergy   . Aortic root dilatation (HCC)    Mild by echo 11/2011 (16mm)  . BPH (benign prostatic hypertrophy)   . Bradycardia    Precluding BB use.  Marland Kitchen CAD (coronary artery disease)    a. s/p Taxus DES to RCA and PDA in 2006 .  // b. Myoview 8/11: Walked 12:37. EF 57%. no ischemia or scar.  // c. Mod CAD by cath 11/2011 (40-50% ISR of PDA -- for OP myoview to assess).  //  d. Myoview 3/18: EF 58, no ischemia; Low Risk  . Colon polyps   . Complication of anesthesia    "they said they about lost, me when I had eye surgery"  . Diverticulitis   . GERD (gastroesophageal reflux disease)   . Glaucoma   . Hyperlipidemia   . Hypertension   . Impaired fasting glucose   . Sleep apnea   . Syncope     Current Outpatient Prescriptions  Medication Sig Dispense Refill  . amLODipine (NORVASC) 5 MG tablet TAKE 1 TABLET BY MOUTH DAILY 90 tablet 3  . aspirin 81 MG tablet Take 81 mg by mouth daily.      . Coenzyme Q10 (CO Q-10) 100 MG CAPS Take 2 capsules by mouth daily.     . fish oil-omega-3 fatty acids 1000 MG capsule Take 1 g by mouth daily.     Marland Kitchen HYDROcodone-acetaminophen (NORCO/VICODIN) 5-325 MG tablet Take 1 tablet by mouth every 6 (six) hours as needed for moderate pain.    Marland Kitchen latanoprost (XALATAN) 0.005 % ophthalmic solution Place 1 drop into both eyes at bedtime.     . nitroGLYCERIN (NITROSTAT) 0.4 MG SL tablet Place 1 tablet (0.4 mg total) under the tongue every 5 (five) minutes x 3 doses as needed  for chest pain. 25 tablet 6  . olmesartan-hydrochlorothiazide (BENICAR HCT) 40-25 MG tablet Take 1 tablet by mouth daily. (Patient taking differently: Take 0.5 tablets by mouth daily. ) 90 tablet 3  . rosuvastatin (CRESTOR) 20 MG tablet Take 0.5 tablets (10 mg total) by mouth daily. 45 tablet 3   No current facility-administered medications for this visit.     Allergies  Allergen Reactions  . Atorvastatin Other (See Comments)    MYALGIAS, LEG CRAMPING  . Ezetimibe-Simvastatin Other (See Comments)    MYALGIAS, CRAMPING  . Lisinopril Cough  . Pravastatin Other (See Comments)    Myalgias at 40mg  dosage    Family History  Problem Relation Age of Onset  . Coronary artery disease Father   . Heart disease Father   . Heart failure Father   . Cancer Mother        ovarian cancer  . Hypertension Neg Hx   . Diabetes Neg Hx     Social History   Social History  . Marital status: Divorced    Spouse name: N/A  . Number of children: 3  .  Years of education: N/A   Occupational History  . driver part time Retired   Social History Main Topics  . Smoking status: Former Smoker    Years: 30.00    Quit date: 05/13/1978  . Smokeless tobacco: Never Used  . Alcohol use 2.4 oz/week    4 Glasses of wine per week     Comment: wine nightly (2-3 glasses) if not working  . Drug use: No  . Sexual activity: Yes   Other Topics Concern  . Not on file   Social History Narrative   No living will   Daughter Benjamine Mola should make medical decisions if he can't   Would accept resuscitation   Wouldn't want prolonged tube feeds if cognitively unaware     Constitutional: Denies fever, malaise, fatigue, headache or abrupt weight changes.  Skin: Pt reports rash. Denies ulcercations.    No other specific complaints in a complete review of systems (except as listed in HPI above).  Objective:   Physical Exam  BP 138/70   Pulse (!) 50   Temp 98 F (36.7 C) (Oral)   Wt 196 lb (88.9 kg)   SpO2  97%   BMI 30.70 kg/m  Wt Readings from Last 3 Encounters:  01/09/17 196 lb (88.9 kg)  10/31/16 187 lb 6.4 oz (85 kg)  07/11/16 193 lb (87.5 kg)    General: Appears his stated age, in NAD. Skin: Scattered maculopapular red lesions noted around ankles, upper thighs and back.  BMET    Component Value Date/Time   NA 141 12/23/2016 0817   K 4.6 12/23/2016 0817   CL 104 12/23/2016 0817   CO2 23 12/23/2016 0817   GLUCOSE 115 (H) 12/23/2016 0817   GLUCOSE 121 (H) 05/27/2016 1014   BUN 26 12/23/2016 0817   CREATININE 1.26 12/23/2016 0817   CREATININE 1.26 (H) 04/12/2016 0823   CALCIUM 9.5 12/23/2016 0817   GFRNONAA 55 (L) 12/23/2016 0817   GFRAA 64 12/23/2016 0817    Lipid Panel     Component Value Date/Time   CHOL 157 10/31/2016 0831   TRIG 155 (H) 10/31/2016 0831   HDL 46 10/31/2016 0831   CHOLHDL 3.4 10/31/2016 0831   CHOLHDL 2.9 04/12/2016 0823   VLDL 15 04/12/2016 0823   LDLCALC 80 10/31/2016 0831    CBC    Component Value Date/Time   WBC 8.6 07/02/2016 1042   WBC 5.0 05/27/2016 1014   RBC 4.81 07/02/2016 1042   RBC 4.81 05/27/2016 1014   HGB 15.1 07/02/2016 1042   HGB 14.8 05/27/2016 1014   HCT 43.6 07/02/2016 1042   HCT 43.2 05/27/2016 1014   PLT 229 05/27/2016 1014   MCV 90.8 07/02/2016 1042   MCH 31.4 (A) 07/02/2016 1042   MCH 30.8 05/27/2016 1014   MCHC 34.6 07/02/2016 1042   MCHC 34.3 05/27/2016 1014   RDW 13.7 05/27/2016 1014   LYMPHSABS 1.1 05/20/2016 1154   MONOABS 1.6 (H) 05/20/2016 1154   EOSABS 0.2 05/20/2016 1154   BASOSABS 0.0 05/20/2016 1154    Hgb A1C Lab Results  Component Value Date   HGBA1C 5.9 12/08/2009            Assessment & Plan:   Insect Bite:  Likely chiggers eRx for Prednisone 40 mg x 5 days Scrub the lesions vigorously with warm water and soap  Return precautions discussed Webb Silversmith, NP

## 2017-01-09 NOTE — Telephone Encounter (Signed)
Pt has appt to see R Baity NP on 01/09/17 at 12 noon.

## 2017-01-09 NOTE — Telephone Encounter (Signed)
Patient Name: Patrick [name] DOB: Dec 16, 1940 Initial Comment Caller states he has gotten into chiggers and it has caused him to break out into a rash. Nurse Assessment Nurse: Vallery Sa, RN, Cathy Date/Time (Eastern Time): 01/09/2017 8:31:35 AM Confirm and document reason for call. If symptomatic, describe symptoms. ---Caller states he developed chigger bites on his legs and back about 2 days ago. No severe breathing or swallowing difficulty. No fever. Alert and responsive. Does the patient have any new or worsening symptoms? ---Yes Will a triage be completed? ---Yes Related visit to physician within the last 2 weeks? ---No Does the PT have any chronic conditions? (i.e. diabetes, asthma, etc.) ---Yes List chronic conditions. ---High Blood Pressure and Cholesterol Is this a behavioral health or substance abuse call? ---No Guidelines Guideline Title Affirmed Question Affirmed Notes Insect Bite [1] SEVERE bite pain AND [2] not improved after 2 hours of pain medicine Final Disposition User See Physician within 4 Hours (or PCP triage) Vallery Sa, RN, Cathy Referrals REFERRED TO PCP OFFICE Disagree/Comply: Comply Scheduled for 12 noon appointment 01/09/17 with Webb Silversmith.

## 2017-01-09 NOTE — Patient Instructions (Signed)
Insect Bite, Adult An insect bite can make your skin red, itchy, and swollen. Some insects can spread disease to people with a bite. However, most insect bites do not lead to disease, and most are not serious. Follow these instructions at home: Bite area care  Do not scratch the bite area.  Keep the bite area clean and dry.  Wash the bite area every day with soap and water as told by your doctor.  Check the bite area every day for signs of infection. Check for: ? More redness, swelling, or pain. ? Fluid or blood. ? Warmth. ? Pus. Managing pain, itching, and swelling  You may put any of these on the bite area as told by your doctor: ? A baking soda paste. ? Cortisone cream. ? Calamine lotion.  If directed, put ice on the bite area. ? Put ice in a plastic bag. ? Place a towel between your skin and the bag. ? Leave the ice on for 20 minutes, 2-3 times a day. Medicines  Take medicines or put medicines on your skin only as told by your doctor.  If you were prescribed an antibiotic medicine, use it as told by your doctor. Do not stop using the antibiotic even if your condition improves. General instructions  Keep all follow-up visits as told by your doctor. This is important. How is this prevented? To help you have a lower risk of insect bites:  When you are outside, wear clothing that covers your arms and legs.  Use insect repellent. The best insect repellents have: ? An active ingredient of DEET, picaridin, oil of lemon eucalyptus (OLE), or IR3535. ? Higher amounts of DEET or another active ingredient than other repellents have.  If your home windows do not have screens, think about putting some in.  Contact a doctor if:  You have more redness, swelling, or pain in the bite area.  You have fluid, blood, or pus coming from the bite area.  The bite area feels warm.  You have a fever. Get help right away if:  You have joint pain.  You have a rash.  You have  shortness of breath.  You feel more tired or sleepy than you normally do.  You have neck pain.  You have a headache.  You feel weaker than you normally do.  You have chest pain.  You have pain in your belly.  You feel sick to your stomach (nauseous) or you throw up (vomit). Summary  An insect bite can make your skin red, itchy, and swollen.  Do not scratch the bite area, and keep it clean and dry.  Ice can help with pain and itching from the bite. This information is not intended to replace advice given to you by your health care provider. Make sure you discuss any questions you have with your health care provider. Document Released: 04/26/2000 Document Revised: 11/30/2015 Document Reviewed: 09/14/2014 Elsevier Interactive Patient Education  2018 [employer] Inc[employer]  

## 2017-01-29 DIAGNOSIS — H401131 Primary open-angle glaucoma, bilateral, mild stage: Secondary | ICD-10-CM | POA: Diagnosis not present

## 2017-03-03 ENCOUNTER — Ambulatory Visit: Payer: Medicare Other

## 2017-03-03 ENCOUNTER — Encounter: Payer: Medicare Other | Admitting: Internal Medicine

## 2017-03-18 ENCOUNTER — Encounter: Payer: Self-pay | Admitting: Internal Medicine

## 2017-03-18 ENCOUNTER — Ambulatory Visit (INDEPENDENT_AMBULATORY_CARE_PROVIDER_SITE_OTHER): Payer: Medicare Other

## 2017-03-18 ENCOUNTER — Ambulatory Visit (INDEPENDENT_AMBULATORY_CARE_PROVIDER_SITE_OTHER): Payer: Medicare Other | Admitting: Internal Medicine

## 2017-03-18 VITALS — BP 112/70 | HR 54 | Temp 97.7°F | Ht 66.75 in | Wt 196.0 lb

## 2017-03-18 DIAGNOSIS — Z0000 Encounter for general adult medical examination without abnormal findings: Secondary | ICD-10-CM | POA: Diagnosis not present

## 2017-03-18 DIAGNOSIS — I77810 Thoracic aortic ectasia: Secondary | ICD-10-CM

## 2017-03-18 DIAGNOSIS — N400 Enlarged prostate without lower urinary tract symptoms: Secondary | ICD-10-CM | POA: Diagnosis not present

## 2017-03-18 DIAGNOSIS — I2510 Atherosclerotic heart disease of native coronary artery without angina pectoris: Secondary | ICD-10-CM

## 2017-03-18 DIAGNOSIS — I10 Essential (primary) hypertension: Secondary | ICD-10-CM | POA: Diagnosis not present

## 2017-03-18 DIAGNOSIS — E785 Hyperlipidemia, unspecified: Secondary | ICD-10-CM

## 2017-03-18 DIAGNOSIS — K2100 Gastro-esophageal reflux disease with esophagitis, without bleeding: Secondary | ICD-10-CM

## 2017-03-18 MED ORDER — OMEPRAZOLE 20 MG PO CPDR: 20.0000 mg | DELAYED_RELEASE_CAPSULE | Freq: Every day | ORAL | 11 refills | Status: DC

## 2017-03-18 NOTE — Assessment & Plan Note (Signed)
May be due for repeat echo soon

## 2017-03-18 NOTE — Progress Notes (Signed)
   Subjective:    Patient ID: Patrick [name], male    DOB: 1940/06/08, 76 y.o.   MRN: [id number]  HPI I reviewed health advisor's note, was available for consultation, and agree with documentation and plan.    Review of Systems     Objective:   Physical Exam        Assessment & Plan:

## 2017-03-18 NOTE — Progress Notes (Signed)
Subjective:    Patient ID: Patrick [name], male    DOB: Oct 31, 1940, 76 y.o.   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 amLODipine (NORVASC) 5 MG tablet TAKE 1 TABLET BY MOUTH DAILY 90 tablet 3  . aspirin 81 MG tablet Take 81 mg by mouth daily.      . Coenzyme Q10 (CO Q-10) 100 MG CAPS Take 2 capsules by mouth daily.     . fish oil-omega-3 fatty acids 1000 MG capsule Take 1 g by mouth daily.     Marland Kitchen latanoprost (XALATAN) 0.005 % ophthalmic solution Place 1 drop into both eyes at bedtime.     Marland Kitchen olmesartan-hydrochlorothiazide (BENICAR HCT) 40-25 MG tablet Take 1 tablet by mouth daily. (Patient taking differently: Take 0.5 tablets by mouth daily. ) 90 tablet 3  . rosuvastatin (CRESTOR) 20 MG tablet Take 0.5 tablets (10 mg total) by mouth daily. 45 tablet 3  . nitroGLYCERIN (NITROSTAT) 0.4 MG SL tablet Place 1 tablet (0.4 mg total) under the tongue every 5 (five) minutes x 3 doses as needed for chest pain. 25 tablet 6   No current facility-administered medications on file prior to visit.     Allergies  Allergen Reactions  . Atorvastatin Other (See Comments)    MYALGIAS, LEG CRAMPING  . Ezetimibe-Simvastatin Other (See Comments)    MYALGIAS, CRAMPING  . Lisinopril Cough  . Pravastatin Other (See Comments)   Myalgias at 40mg  dosage    Past Medical History:  Diagnosis Date  . Allergy   . Aortic root dilatation (HCC)    Mild by echo 11/2011 (46mm)  . BPH (benign prostatic hypertrophy)   . Bradycardia    Precluding BB use.  Marland Kitchen CAD (coronary artery disease)    a. s/p Taxus DES to RCA and PDA in 2006 .  // b. Myoview 8/11: Walked 12:37. EF 57%. no ischemia or scar.  // c. Mod CAD by cath 11/2011 (40-50% ISR of PDA -- for OP myoview to assess).  //  d. Myoview 3/18: EF 58, no ischemia; Low Risk  . Colon polyps   . Complication of anesthesia    "they said they about lost, me when I had eye surgery"  . Diverticulitis   . GERD (gastroesophageal reflux disease)   . Glaucoma   . Hyperlipidemia   . Hypertension   . Impaired fasting glucose   . Sleep apnea   . Syncope     Past Surgical History:  Procedure Laterality Date  . ANGIOPLASTY     percutaneous transluminal coronary  angioplasty and stenting of the right coronary artery and posterior   . CATARACT EXTRACTION W/ INTRAOCULAR LENS  IMPLANT, BILATERAL  9/15  . COLONOSCOPY    . INTRAOCULAR LENS INSERTION    . PILONIDAL CYST /  SINUS EXCISION      Family History  Problem Relation Age of Onset  . Coronary artery disease Father   . Heart disease Father   . Heart failure Father   . Cancer Mother        ovarian cancer  . Hypertension Neg Hx   . Diabetes Neg Hx     Social History   Socioeconomic History  . Marital status: Divorced    Spouse name: Not on file  . Number of children: 3  . Years of education: Not on file  . Highest education level: Not on file  Social Needs  . Financial resource strain: Not on file  . Food insecurity - worry: Not on file  . Food insecurity - inability: Not on file  . Transportation needs - medical: Not on file  . Transportation needs - non-medical: Not on file  Occupational History  . Occupation: driver part Geophysicist/field seismologist: retired  Tobacco Use  . Smoking status: Former Smoker    Years: 30.00      Last attempt to quit: 05/13/1978    Years since quitting: 38.8  . Smokeless tobacco: Never Used  Substance and Sexual Activity  . Alcohol use: Yes    Alcohol/week: 8.4 oz    Types: 14 Glasses of wine per week  . Drug use: No  . Sexual activity: Yes  Other Topics Concern  . Not on file  Social History Narrative   No living will   Daughter Benjamine Mola should make medical decisions if he can't   Would accept resuscitation   Wouldn't want prolonged tube feeds if cognitively unaware    Review of Systems  No heartbvurn Appetite is good Weight stable Sleeps well Wears seat belt Bowels are fine. No blood     Objective:   Physical Exam  Constitutional: He appears well-developed. No distress.  HENT:  Mouth/Throat: Oropharynx is clear and moist. No oropharyngeal exudate.  Neck: No thyromegaly present.  Cardiovascular: Normal rate, regular rhythm, normal heart sounds and intact distal pulses. Exam reveals no gallop.  No murmur heard. Pulmonary/Chest: Effort normal and breath sounds normal. No respiratory distress. He has no wheezes. He has no rales.  Abdominal: Soft. He exhibits no distension. There is no tenderness. There is no rebound and no guarding.  Musculoskeletal: He exhibits no edema or tenderness.  Lymphadenopathy:    He has no cervical adenopathy.  Skin: No rash noted. No erythema.  Psychiatric: He has a normal mood and affect. His behavior is normal.          Assessment & Plan:

## 2017-03-18 NOTE — Progress Notes (Signed)
PCP notes:   Health maintenance:  Flu vaccine - per pt, vaccine given 10/18 at pharmacy  Abnormal screenings:   Hearing - failed  Hearing Screening   125Hz  250Hz  500Hz  1000Hz  2000Hz  3000Hz  4000Hz  6000Hz  8000Hz   Right ear:   40 40 40  0    Left ear:   40 0 40  0      Patient concerns:   None  Nurse concerns:  None  Next PCP appt:   03/18/17 @ 0900

## 2017-03-18 NOTE — Assessment & Plan Note (Signed)
No angina Recent stress test reassuring

## 2017-03-18 NOTE — Patient Instructions (Signed)
Patrick [name] , Thank you for taking time to come for your Medicare Wellness Visit. I appreciate your ongoing commitment to your health goals. Please review the following plan we discussed and let me know if I can assist you in the future.   These are the goals we discussed: Goals    Starting 03/18/2017, I will attempt to drink at least 6-8 glasses of water daily.       This is a list of the screening recommended for you and due dates:  Health Maintenance  Topic Date Due  . Tetanus Vaccine  08/03/2023  . Flu Shot  Completed  . Pneumonia vaccines  Completed   Preventive Care for Adults  A healthy lifestyle and preventive care can promote health and wellness. Preventive health guidelines for adults include the following key practices.  . A routine yearly physical is a good way to check with your health care provider about your health and preventive screening. It is a chance to share any concerns and updates on your health and to receive a thorough exam.  . Visit your dentist for a routine exam and preventive care every 6 months. Brush your teeth twice a day and floss once a day. Good oral hygiene prevents tooth decay and gum disease.  . The frequency of eye exams is based on your age, health, family medical history, use  of contact lenses, and other factors. Follow your health care provider's recommendations for frequency of eye exams.  . Eat a healthy diet. Foods like vegetables, fruits, whole grains, low-fat dairy products, and lean protein foods contain the nutrients you need without too many calories. Decrease your intake of foods high in solid fats, added sugars, and salt. Eat the right amount of calories for you. Get information about a proper diet from your health care provider, if necessary.  . Regular physical exercise is one of the most important things you can do for your health. Most adults should get at least 150 minutes of moderate-intensity exercise (any activity that increases  your heart rate and causes you to sweat) each week. In addition, most adults need muscle-strengthening exercises on 2 or more days a week.  Silver Sneakers may be a benefit available to you. To determine eligibility, you may visit the website: www.silversneakers.com or contact program at [phone] Mon-Fri between 8AM-8PM.   . Maintain a healthy weight. The body mass index (BMI) is a screening tool to identify possible weight problems. It provides an estimate of body fat based on height and weight. Your health care provider can find your BMI and can help you achieve or maintain a healthy weight.   For adults 20 years and older: ? A BMI below 18.5 is considered underweight. ? A BMI of 18.5 to 24.9 is normal. ? A BMI of 25 to 29.9 is considered overweight. ? A BMI of 30 and above is considered obese.   . Maintain normal blood lipids and cholesterol levels by exercising and minimizing your intake of saturated fat. Eat a balanced diet with plenty of fruit and vegetables. Blood tests for lipids and cholesterol should begin at age 24 and be repeated every 5 years. If your lipid or cholesterol levels are high, you are over 50, or you are at high risk for heart disease, you may need your cholesterol levels checked more frequently. Ongoing high lipid and cholesterol levels should be treated with medicines if diet and exercise are not working.  . If you smoke, find out from your health  care provider how to quit. If you do not use tobacco, please do not start.  . If you choose to drink alcohol, please do not consume more than 2 drinks per day. One drink is considered to be 12 ounces (355 mL) of beer, 5 ounces (148 mL) of wine, or 1.5 ounces (44 mL) of liquor.  . If you are 28-23 years old, ask your health care provider if you should take aspirin to prevent strokes.  . Use sunscreen. Apply sunscreen liberally and repeatedly throughout the day. You should seek shade when your shadow is shorter than you.  Protect yourself by wearing long sleeves, pants, a wide-brimmed hat, and sunglasses year round, whenever you are outdoors.  . Once a month, do a whole body skin exam, using a mirror to look at the skin on your back. Tell your health care provider of new moles, moles that have irregular borders, moles that are larger than a pencil eraser, or moles that have changed in shape or color.

## 2017-03-18 NOTE — Assessment & Plan Note (Signed)
BP Readings from Last 3 Encounters:  03/18/17 112/70  03/18/17 112/70  01/09/17 138/70   Good control Recent blood work okay

## 2017-03-18 NOTE — Assessment & Plan Note (Signed)
No sig problems No need for medications yet

## 2017-03-18 NOTE — Assessment & Plan Note (Signed)
Some increased symptoms Discussed using 2 week course to quiet down and as needed (like if he eats late)

## 2017-03-18 NOTE — Assessment & Plan Note (Signed)
No problems with statin for secondary prevention Lab Results  Component Value Date   LDLCALC 80 10/31/2016

## 2017-03-18 NOTE — Progress Notes (Signed)
Pre visit review using our clinic review tool, if applicable. No additional management support is needed unless otherwise documented below in the visit note. 

## 2017-03-18 NOTE — Progress Notes (Signed)
Subjective:   Patrick [name] is a 75 y.o. male who presents for Medicare Annual/Subsequent preventive examination.  Review of Systems:  N/A Cardiac Risk Factors include: advanced age (>10men, >59 women);dyslipidemia;hypertension;male gender;obesity (BMI >30kg/m2)     Objective:    Vitals: BP 112/70 (BP Location: Right Arm, Patient Position: Sitting, Cuff Size: Normal)   Pulse (!) 54   Temp 97.7 F (36.5 C) (Oral)   Ht 5' 6.75" (1.695 m) Comment: no shoes  Wt 196 lb (88.9 kg)   SpO2 94%   BMI 30.93 kg/m   Body mass index is 30.93 kg/m.  Tobacco Social History   Tobacco Use  Smoking Status Former Smoker  . Years: 30.00  . Last attempt to quit: 05/13/1978  . Years since quitting: 38.8  Smokeless Tobacco Never Used     Counseling given: No   Past Medical History:  Diagnosis Date  . Allergy   . Aortic root dilatation (HCC)    Mild by echo 11/2011 (68mm)  . BPH (benign prostatic hypertrophy)   . Bradycardia    Precluding BB use.  Marland Kitchen CAD (coronary artery disease)    a. s/p Taxus DES to RCA and PDA in 2006 .  // b. Myoview 8/11: Walked 12:37. EF 57%. no ischemia or scar.  // c. Mod CAD by cath 11/2011 (40-50% ISR of PDA -- for OP myoview to assess).  //  d. Myoview 3/18: EF 58, no ischemia; Low Risk  . Colon polyps   . Complication of anesthesia    "they said they about lost, me when I had eye surgery"  . Diverticulitis   . GERD (gastroesophageal reflux disease)   . Glaucoma   . Hyperlipidemia   . Hypertension   . Impaired fasting glucose   . Sleep apnea   . Syncope    Past Surgical History:  Procedure Laterality Date  . ANGIOPLASTY     percutaneous transluminal coronary  angioplasty and stenting of the right coronary artery and posterior   . CATARACT EXTRACTION W/ INTRAOCULAR LENS  IMPLANT, BILATERAL  9/15  . COLONOSCOPY    . INTRAOCULAR LENS INSERTION    . PILONIDAL CYST / SINUS EXCISION     Family History  Problem Relation Age of Onset  . Coronary artery  disease Father   . Heart disease Father   . Heart failure Father   . Cancer Mother        ovarian cancer  . Hypertension Neg Hx   . Diabetes Neg Hx    Social History   Substance and Sexual Activity  Sexual Activity Yes    Outpatient Encounter Medications as of 03/18/2017  Medication Sig  . amLODipine (NORVASC) 5 MG tablet TAKE 1 TABLET BY MOUTH DAILY  . aspirin 81 MG tablet Take 81 mg by mouth daily.    . Coenzyme Q10 (CO Q-10) 100 MG CAPS Take 2 capsules by mouth daily.   . fish oil-omega-3 fatty acids 1000 MG capsule Take 1 g by mouth daily.   Marland Kitchen latanoprost (XALATAN) 0.005 % ophthalmic solution Place 1 drop into both eyes at bedtime.   Marland Kitchen olmesartan-hydrochlorothiazide (BENICAR HCT) 40-25 MG tablet Take 1 tablet by mouth daily. (Patient taking differently: Take 0.5 tablets by mouth daily. )  . rosuvastatin (CRESTOR) 20 MG tablet Take 0.5 tablets (10 mg total) by mouth daily.  . [DISCONTINUED] predniSONE (DELTASONE) 20 MG tablet Take 2 tablets daily with breakfast for 5 days  . HYDROcodone-acetaminophen (NORCO/VICODIN) 5-325 MG tablet Take 1  tablet by mouth every 6 (six) hours as needed for moderate pain.  . nitroGLYCERIN (NITROSTAT) 0.4 MG SL tablet Place 1 tablet (0.4 mg total) under the tongue every 5 (five) minutes x 3 doses as needed for chest pain.   No facility-administered encounter medications on file as of 03/18/2017.     Activities of Daily Living In your present state of health, do you have any difficulty performing the following activities: 03/18/2017 05/27/2016  Hearing? N N  Vision? N N  Difficulty concentrating or making decisions? N Y  Walking or climbing stairs? N N  Dressing or bathing? N N  Doing errands, shopping? N -  Preparing Food and eating ? N -  Using the Toilet? N -  In the past six months, have you accidently leaked urine? N -  Do you have problems with loss of bowel control? N -  Managing your Medications? N -  Managing your Finances? N -    Housekeeping or managing your Housekeeping? N -  Some recent data might be hidden    Patient Care Team: Venia Carbon, MD as PCP - General Nahser, Wonda Cheng, MD as Consulting Physician (Cardiology) Kary Kos, MD as Consulting Physician (Neurosurgery) Clarene Essex, MD as Consulting Physician (Gastroenterology) Eula Listen, DDS as Referring Physician (Dentistry) Quentin Mulling, OD as Referring Physician (Optometry)   Assessment:     Hearing Screening   125Hz  250Hz  500Hz  1000Hz  2000Hz  3000Hz  4000Hz  6000Hz  8000Hz   Right ear:   40 40 40  0    Left ear:   40 0 40  0    Vision Screening Comments: Last vision exam in April 2018 @ [name]    Exercise Activities and Dietary recommendations Current Exercise Habits: The patient does not participate in regular exercise at present, Exercise limited by: Other - see comments(work committments)  Goals    None     Fall Risk Fall Risk  03/18/2017 07/02/2016 08/28/2015 05/16/2015 05/20/2014  Falls in the past year? No No No No No   Depression Screen PHQ 2/9 Scores 03/18/2017 07/02/2016 08/28/2015 05/16/2015  PHQ - 2 Score 0 0 0 0  PHQ- 9 Score 0 - - -    Cognitive Function MMSE - Mini Mental State Exam 03/18/2017 08/28/2015  Orientation to time 5 5  Orientation to Place 5 5  Registration 3 3  Attention/ Calculation 0 0  Recall 3 3  Language- name 2 objects 0 0  Language- repeat 1 1  Language- follow 3 step command 3 3  Language- read & follow direction 0 0  Write a sentence 0 0  Copy design 0 0  Total score 20 20     PLEASE NOTE: A Mini-Cog screen was completed. Maximum score is 20. A value of 0 denotes this part of Folstein MMSE was not completed or the patient failed this part of the Mini-Cog screening.   Mini-Cog Screening Orientation to Time - Max 5 pts Orientation to Place - Max 5 pts Registration - Max 3 pts Recall - Max 3 pts Language Repeat - Max 1 pts Language Follow 3 Step Command - Max 3 pts     Immunization History   Administered Date(s) Administered  . Influenza Split 01/20/2011  . Influenza,inj,Quad PF,6+ Mos 03/01/2014, 02/27/2016  . Influenza-Unspecified 02/07/2013, 02/08/2016, 02/10/2017  . Pneumococcal Conjugate-13 08/02/2013  . Pneumococcal Polysaccharide-23 10/28/2008  . Td 06/05/2002, 08/02/2013  . Zoster 12/28/2010   Screening Tests Health Maintenance  Topic Date Due  . TETANUS/TDAP  08/03/2023  . INFLUENZA VACCINE  Completed  . PNA vac Low Risk Adult  Completed      Plan:     I have personally reviewed, addressed, and noted the following in the patient's chart:  A. Medical and social history B. Use of alcohol, tobacco or illicit drugs  C. Current medications and supplements D. Functional ability and status E.  Nutritional status F.  Physical activity G. Advance directives H. List of other physicians I.  Hospitalizations, surgeries, and ER visits in previous 12 months J.  Anderson to include hearing, vision, cognitive, depression L. Referrals and appointments - none  In addition, I have reviewed and discussed with patient certain preventive protocols, quality metrics, and best practice recommendations. A written personalized care plan for preventive services as well as general preventive health recommendations were provided to patient.  See attached scanned questionnaire for additional information.   Signed,   Lindell Noe, MHA, BS, LPN Health Coach

## 2017-04-03 ENCOUNTER — Other Ambulatory Visit: Payer: Self-pay | Admitting: Internal Medicine

## 2017-04-28 ENCOUNTER — Telehealth: Payer: Self-pay | Admitting: Cardiovascular Disease

## 2017-04-28 DIAGNOSIS — H401132 Primary open-angle glaucoma, bilateral, moderate stage: Secondary | ICD-10-CM | POA: Diagnosis not present

## 2017-04-28 DIAGNOSIS — H26491 Other secondary cataract, right eye: Secondary | ICD-10-CM | POA: Diagnosis not present

## 2017-04-28 NOTE — Telephone Encounter (Signed)
°  New Prob  Calling to verify pt is still taking olmesartan-hydrochlorothiazide (BENICAR HCT) 40-25 MG tablet. Please call.

## 2017-04-29 NOTE — Telephone Encounter (Signed)
Left detailed message on voice mail of Marcelima, pharmacist with [facility]. I advised her that our last documentation is that patient is taking 1/2 tab of Benicar (olmesartan-hctz 40-25 mg). I advised her to call back with questions or concerns.

## 2017-05-20 DIAGNOSIS — K449 Diaphragmatic hernia without obstruction or gangrene: Secondary | ICD-10-CM | POA: Diagnosis not present

## 2017-05-27 ENCOUNTER — Encounter (INDEPENDENT_AMBULATORY_CARE_PROVIDER_SITE_OTHER): Payer: Self-pay

## 2017-05-27 ENCOUNTER — Encounter: Payer: Self-pay | Admitting: Cardiovascular Disease

## 2017-05-27 ENCOUNTER — Ambulatory Visit: Payer: Medicare Other | Admitting: Cardiovascular Disease

## 2017-05-27 VITALS — BP 144/74 | HR 65 | Ht 66.0 in | Wt 197.0 lb

## 2017-05-27 DIAGNOSIS — I2510 Atherosclerotic heart disease of native coronary artery without angina pectoris: Secondary | ICD-10-CM | POA: Diagnosis not present

## 2017-05-27 DIAGNOSIS — I10 Essential (primary) hypertension: Secondary | ICD-10-CM | POA: Diagnosis not present

## 2017-05-27 NOTE — Progress Notes (Signed)
Patrick [name] Date of Birth  1940-11-10       [facility]    [employer] [address], [place], [place]  [address]   [place], [place]  [address] [phone]     [phone]   Fax  [phone]    Fax [phone]  Problem List: 1. Coronary artery disease-moderate.  He's had a previous stent placed. 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 A subsequent Myoview study was normal. 2. Hypertension 3. Hyperlipidemia 4. Sleep apnea     Mr. Bonito is doing very will.   He has been on a mission trip and to [employer].  No further episodes of chest pain.   He hauled 5 loads of hogs last week - his brother raises the hogs.  He is planning on going to [place] over Christmas.  He still does his lawn care business.  He walks at the [employer] - 3 miles a day.  Without symptoms.  November 09, 2012:  Jakyron is doing well.  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   He is a retired Administrator.   Still walking 3 days a week.    Oct. 9, 2015:  Still very active - mows 8-9 yards a week.  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  No angina. BP is high. Has had a cold - is taking chlorpheneramine  Has had normal Bp at home   Sep 19, 2014:   Still very active.  Drives a truck.  Mows and trims 10 yards a week without any problems.  Has atypical  chest pain with mental stress but never with physical exertion.  Does not restrict his diet.   Nov. 18, 2016  Still working hard ( driving a truck and mowing yards)  Gets more short of breath compared to several years ago  Still eats some salty foods.   Has a lady who is cooking for him  - knows that she puts lots of salt in the food  October 16, 2015:  Leeandre is seen back for his CAD, HTN, HLD . OSA  Just got back from a cruise in [place] .  Still very active.   Mows yards, still drives trucks.     Dec. 4, 2017:  Dany is seen today with  [facility]. Still drives his truck and mows regularly .   No real CP or dyspnea.   Very rare episodes of chest tightness.  Keeps his BP at home.  Seems to go up when he comes to the doctor .   October 31, 2016:  Anden is seen today for follow up  Had back surgery in January .   Back pain has returned . Still very busy,   Mows frequently .   Has a [employer] that he loves but seems to contribute to his back pain.   No angnia  BP at home has been mildly elevated.   Admits that he eats more salt than he should .   Jan. 15, 2019:   BP is a little elevate.  No dyspnea. , no CP  Eating a bit of extra salt.  Has not been exercising .   Current Outpatient Medications on File Prior to Visit  Medication Sig Dispense Refill  . amLODipine (NORVASC) 5 MG tablet TAKE 1 TABLET BY MOUTH DAILY 90 tablet 3  . aspirin 81 MG tablet Take  81 mg by mouth daily.      . Coenzyme Q10 (CO Q-10) 100 MG CAPS Take 2 capsules by mouth daily.     . fish oil-omega-3 fatty acids 1000 MG capsule Take 1 g by mouth daily.     Marland Kitchen latanoprost (XALATAN) 0.005 % ophthalmic solution Place 1 drop into both eyes at bedtime.     Marland Kitchen olmesartan-hydrochlorothiazide (BENICAR HCT) 40-25 MG tablet Take 1 tablet by mouth daily. (Patient taking differently: Take 0.5 tablets by mouth daily. ) 90 tablet 3  . omeprazole (PRILOSEC) 40 MG capsule Take 1 capsule by mouth daily.    . rosuvastatin (CRESTOR) 20 MG tablet Take 0.5 tablets (10 mg total) by mouth daily. 45 tablet 3  . nitroGLYCERIN (NITROSTAT) 0.4 MG SL tablet Place 1 tablet (0.4 mg total) under the tongue every 5 (five) minutes x 3 doses as needed for chest pain. 25 tablet 6   No current facility-administered medications on file prior to visit.     Allergies  Allergen Reactions  . Atorvastatin Other (See Comments)    MYALGIAS, LEG CRAMPING  . Ezetimibe-Simvastatin Other (See Comments)    MYALGIAS, CRAMPING  . Lisinopril Cough  . Pravastatin Other (See Comments)     Myalgias at 40mg  dosage    Past Medical History:  Diagnosis Date  . Allergy   . Aortic root dilatation (HCC)    Mild by echo 11/2011 (43mm)  . BPH (benign prostatic hypertrophy)   . Bradycardia    Precluding BB use.  Marland Kitchen CAD (coronary artery disease)    a. s/p Taxus DES to RCA and PDA in 2006 .  // b. Myoview 8/11: Walked 12:37. EF 57%. no ischemia or scar.  // c. Mod CAD by cath 11/2011 (40-50% ISR of PDA -- for OP myoview to assess).  //  d. Myoview 3/18: EF 58, no ischemia; Low Risk  . Colon polyps   . Complication of anesthesia    "they said they about lost, me when I had eye surgery"  . Diverticulitis   . GERD (gastroesophageal reflux disease)   . Glaucoma   . Hyperlipidemia   . Hypertension   . Impaired fasting glucose   . Sleep apnea   . Syncope     Past Surgical History:  Procedure Laterality Date  . ANGIOPLASTY     percutaneous transluminal coronary  angioplasty and stenting of the right coronary artery and posterior   . CATARACT EXTRACTION W/ INTRAOCULAR LENS  IMPLANT, BILATERAL  9/15  . COLONOSCOPY    . INTRAOCULAR LENS INSERTION    . LEFT HEART CATHETERIZATION WITH CORONARY ANGIOGRAM N/A 11/26/2011   Procedure: LEFT HEART CATHETERIZATION WITH CORONARY ANGIOGRAM;  Surgeon: Thayer Headings, MD;  Location: [facility] CATH LAB;  Service: Cardiovascular;  Laterality: N/A;  . LUMBAR LAMINECTOMY/DECOMPRESSION MICRODISCECTOMY Left 05/27/2016   Procedure: Laminectomy for facet/synovial cyst - Lumbar four - Lumbar five - left;  Surgeon: Kary Kos, MD;  Location: [place] [facility];  Service: Neurosurgery;  Laterality: Left;  Laminectomy for facet/synovial cyst - Lumbar four - Lumbar five - left  . PILONIDAL CYST / SINUS EXCISION      Social History   Tobacco Use  Smoking Status Former Smoker  . Years: 30.00  . Last attempt to quit: 05/13/1978  . Years since quitting: 39.0  Smokeless Tobacco Never Used    Social History   Substance and Sexual Activity  Alcohol Use Yes  . Alcohol/week: 8.4  oz  . Types: 14 Glasses of wine  per week    Family History  Problem Relation Age of Onset  . Coronary artery disease Father   . Heart disease Father   . Heart failure Father   . Cancer Mother        ovarian cancer  . Hypertension Neg Hx   . Diabetes Neg Hx     Reviw of Systems:  Noted in current history, otherwise systems are negative.  Physical Exam: Blood pressure (!) 144/74, pulse 65, height 5\' 6"  (1.676 m), weight 197 lb (89.4 kg).  GEN:  Well nourished, well developed in no acute distress HEENT: Normal NECK: No JVD; No carotid bruits LYMPHATICS: No lymphadenopathy CARDIAC: RR, no murmurs, rubs, gallops RESPIRATORY:  Clear to auscultation without rales, wheezing or rhonchi  ABDOMEN: Soft, non-tender, non-distended MUSCULOSKELETAL:  No edema; No deformity  SKIN: Warm and dry NEUROLOGIC:  Alert and oriented x 3   ECG: May 27, 2017: Normal sinus rhythm at 65.  Minimal voltage criteria for left ventricular hypertrophy  Assessment / Plan:   1. Coronary artery disease-moderate.  He's had a previous stent placed.   No recent angina  2. Hypertension-   BP is slightly elevated.  Needs to continue watching his salt and needs to get back out and exercise more  3. Hyperlipidemia-      4. Sleep apnea   Mertie Moores, MD  05/27/2017 3:45 PM    [place] [address],  [place], [place]  [address] Pager [phone] Phone: [phone]; Fax: [phone]

## 2017-05-27 NOTE — Patient Instructions (Signed)
Medication Instructions:  Your physician recommends that you continue on your current medications as directed. Please refer to the Current Medication list given to you today.   Labwork: TODAY - basic metabolic panel   Testing/Procedures: None Ordered   Follow-Up: Your physician wants you to follow-up in: 6 months with Dr. Nahser. You will receive a reminder letter in the mail two months in advance. If you don't receive a letter, please call our office to schedule the follow-up appointment.   If you need a refill on your cardiac medications before your next appointment, please call your pharmacy.   Thank you for choosing [facility]! Zell Doucette, RN [phone]    

## 2017-05-28 LAB — BASIC METABOLIC PANEL
BUN/Creatinine Ratio: 19 (ref 10–24)
BUN: 21 mg/dL (ref 8–27)
CO2: 22 mmol/L (ref 20–29)
Calcium: 9.3 mg/dL (ref 8.6–10.2)
Chloride: 103 mmol/L (ref 96–106)
Creatinine, Ser: 1.13 mg/dL (ref 0.76–1.27)
GFR calc Af Amer: 73 mL/min/{1.73_m2} (ref >59)
GFR, EST NON AFRICAN AMERICAN: 63 mL/min/{1.73_m2} (ref >59)
Glucose: 155 mg/dL — ABNORMAL HIGH (ref 65–99)
POTASSIUM: 4.3 mmol/L (ref 3.5–5.2)
SODIUM: 140 mmol/L (ref 134–144)

## 2017-07-15 ENCOUNTER — Telehealth: Payer: Self-pay | Admitting: Internal Medicine

## 2017-07-15 DIAGNOSIS — I2510 Atherosclerotic heart disease of native coronary artery without angina pectoris: Secondary | ICD-10-CM

## 2017-07-15 DIAGNOSIS — I2584 Coronary atherosclerosis due to calcified coronary lesion: Secondary | ICD-10-CM

## 2017-07-15 DIAGNOSIS — E785 Hyperlipidemia, unspecified: Secondary | ICD-10-CM

## 2017-07-15 NOTE — Telephone Encounter (Signed)
Copied from [place] [id number]. Topic: Inquiry >> Jul 15, 2017  6:13 PM Cecelia Byars, NT wrote: Reason for CRM: [facility] [place] [facility] and said [facility] is going [place] of the country  he needs nitroglycerine tablets  and he is leaving on Monday  please them at  [phone]

## 2017-07-16 MED ORDER — NITROGLYCERIN 0.4 MG SL SUBL: 0.4000 mg | SUBLINGUAL_TABLET | SUBLINGUAL | 1 refills | Status: DC | PRN

## 2017-07-16 NOTE — Telephone Encounter (Signed)
Patient going out of the country and asks for refill of Nitroglycerin tabs  Last OV: 03/18/17 PCP: Silvio Pate Pharmacy: [place], [place], [place] [phone] (Phone) [phone] (Fax)

## 2017-07-16 NOTE — Telephone Encounter (Signed)
Rx sent electronically.  

## 2017-10-13 ENCOUNTER — Ambulatory Visit (INDEPENDENT_AMBULATORY_CARE_PROVIDER_SITE_OTHER): Payer: Medicare Other | Admitting: Family Medicine

## 2017-10-13 ENCOUNTER — Encounter: Payer: Self-pay | Admitting: Family Medicine

## 2017-10-13 VITALS — BP 132/62 | HR 64 | Temp 98.4°F | Ht 64.0 in | Wt 190.8 lb

## 2017-10-13 DIAGNOSIS — W57XXXA Bitten or stung by nonvenomous insect and other nonvenomous arthropods, initial encounter: Secondary | ICD-10-CM | POA: Diagnosis not present

## 2017-10-13 DIAGNOSIS — R197 Diarrhea, unspecified: Secondary | ICD-10-CM | POA: Diagnosis not present

## 2017-10-13 DIAGNOSIS — S30861A Insect bite (nonvenomous) of abdominal wall, initial encounter: Secondary | ICD-10-CM | POA: Diagnosis not present

## 2017-10-13 DIAGNOSIS — R531 Weakness: Secondary | ICD-10-CM | POA: Diagnosis not present

## 2017-10-13 LAB — CBC WITH DIFFERENTIAL/PLATELET
BASOS ABS: 0.1 10*3/uL (ref 0.0–0.1)
Basophils Relative: 1.1 % (ref 0.0–3.0)
Eosinophils Absolute: 0.3 10*3/uL (ref 0.0–0.7)
Eosinophils Relative: 4.3 % (ref 0.0–5.0)
HCT: 43.6 % (ref 39.0–52.0)
Hemoglobin: 15 g/dL (ref 13.0–17.0)
LYMPHS ABS: 1.2 10*3/uL (ref 0.7–4.0)
Lymphocytes Relative: 19.8 % (ref 12.0–46.0)
MCHC: 34.4 g/dL (ref 30.0–36.0)
MCV: 91.4 fl (ref 78.0–100.0)
MONO ABS: 0.6 10*3/uL (ref 0.1–1.0)
MONOS PCT: 9.5 % (ref 3.0–12.0)
NEUTROS PCT: 65.3 % (ref 43.0–77.0)
Neutro Abs: 4.1 10*3/uL (ref 1.4–7.7)
Platelets: 272 10*3/uL (ref 150.0–400.0)
RBC: 4.77 Mil/uL (ref 4.22–5.81)
RDW: 13.6 % (ref 11.5–15.5)
WBC: 6.2 10*3/uL (ref 4.0–10.5)

## 2017-10-13 LAB — COMPREHENSIVE METABOLIC PANEL
ALT: 17 U/L (ref 0–53)
AST: 17 U/L (ref 0–37)
Albumin: 4.6 g/dL (ref 3.5–5.2)
Alkaline Phosphatase: 85 U/L (ref 39–117)
BILIRUBIN TOTAL: 0.4 mg/dL (ref 0.2–1.2)
BUN: 22 mg/dL (ref 6–23)
CO2: 25 mEq/L (ref 19–32)
CREATININE: 1.19 mg/dL (ref 0.40–1.50)
Calcium: 9.6 mg/dL (ref 8.4–10.5)
Chloride: 103 mEq/L (ref 96–112)
GFR: 63.02 mL/min (ref >60.00)
GLUCOSE: 103 mg/dL — AB (ref 70–99)
Potassium: 4.1 mEq/L (ref 3.5–5.1)
SODIUM: 137 meq/L (ref 135–145)
TOTAL PROTEIN: 7.4 g/dL (ref 6.0–8.3)

## 2017-10-13 NOTE — Addendum Note (Signed)
Addended by: Lendon Collar on: 10/13/2017 09:31 AM   Modules accepted: Orders

## 2017-10-13 NOTE — Addendum Note (Signed)
Addended by: Lendon Collar on: 10/13/2017 11:00 AM   Modules accepted: Orders

## 2017-10-13 NOTE — Progress Notes (Signed)
Subjective:    Patient ID: Patrick [name], male    DOB: 07-03-1940, 77 y.o.   MRN: [id number]  HPI This is a 77 yo male who presents today with diarrhea x 1 month He also brings in a tick that he collected 09/07/17. Was not engorged. One week later he felt sick while traveling to [place]. Felt bad for 1 day. Has had 3 watery bowel movements daily since then. No abdominal pain, no fever, no blood or mucus. No myalgias/arthralgias/rash/headache. Has felt a little weak.  No recent antibiotics.  Was in [place] 2 months ago (March 16). Went on [place]. His daughter lives there.   Past Medical History:  Diagnosis Date  . Allergy   . Aortic root dilatation (HCC)    Mild by echo 11/2011 (39mm)  . BPH (benign prostatic hypertrophy)   . Bradycardia    Precluding BB use.  Marland Kitchen CAD (coronary artery disease)    a. s/p Taxus DES to RCA and PDA in 2006 .  // b. Myoview 8/11: Walked 12:37. EF 57%. no ischemia or scar.  // c. Mod CAD by cath 11/2011 (40-50% ISR of PDA -- for OP myoview to assess).  //  d. Myoview 3/18: EF 58, no ischemia; Low Risk  . Colon polyps   . Complication of anesthesia    "they said they about lost, me when I had eye surgery"  . Diverticulitis   . GERD (gastroesophageal reflux disease)   . Glaucoma   . Hyperlipidemia   . Hypertension   . Impaired fasting glucose   . Sleep apnea   . Syncope    Past Surgical History:  Procedure Laterality Date  . ANGIOPLASTY     percutaneous transluminal coronary  angioplasty and stenting of the right coronary artery and posterior   . CATARACT EXTRACTION W/ INTRAOCULAR LENS  IMPLANT, BILATERAL  9/15  . COLONOSCOPY    . INTRAOCULAR LENS INSERTION    . LEFT HEART CATHETERIZATION WITH CORONARY ANGIOGRAM N/A 11/26/2011   Procedure: LEFT HEART CATHETERIZATION WITH CORONARY ANGIOGRAM;  Surgeon: Thayer Headings, MD;  Location: [facility] CATH LAB;  Service: Cardiovascular;  Laterality: N/A;  . LUMBAR LAMINECTOMY/DECOMPRESSION MICRODISCECTOMY Left 05/27/2016    Procedure: Laminectomy for facet/synovial cyst - Lumbar four - Lumbar five - left;  Surgeon: Kary Kos, MD;  Location: [place];  Service: Neurosurgery;  Laterality: Left;  Laminectomy for facet/synovial cyst - Lumbar four - Lumbar five - left  . PILONIDAL CYST / SINUS EXCISION     Family History  Problem Relation Age of Onset  . Coronary artery disease Father   . Heart disease Father   . Heart failure Father   . Cancer Mother        ovarian cancer  . Hypertension Neg Hx   . Diabetes Neg Hx    Social History   Tobacco Use  . Smoking status: Former Smoker    Years: 30.00    Last attempt to quit: 05/13/1978    Years since quitting: 39.4  . Smokeless tobacco: Never Used  Substance Use Topics  . Alcohol use: Yes    Alcohol/week: 8.4 oz    Types: 14 Glasses of wine per week  . Drug use: No      Review of Systems Per HPI    Objective:   Physical Exam  Constitutional: He is oriented to person, place, and time. He appears well-developed and well-nourished. No distress.  HENT:  Head: Normocephalic and atraumatic.  Eyes: Conjunctivae are  normal.  Cardiovascular: Normal rate, regular rhythm and normal heart sounds.  Pulmonary/Chest: Effort normal and breath sounds normal.  Abdominal: Soft. Bowel sounds are normal. He exhibits no distension and no mass. There is no tenderness. There is no rebound and no guarding.  Neurological: He is alert and oriented to person, place, and time.  Skin: Skin is warm and dry. He is not diaphoretic.  Psychiatric: He has a normal mood and affect. His behavior is normal. Judgment and thought content normal.  Vitals reviewed.     BP 132/62 (BP Location: Right Arm, Patient Position: Sitting, Cuff Size: Normal)   Pulse 64   Temp 98.4 F (36.9 C) (Oral)   Ht 5\' 4"  (1.626 m)   Wt 190 lb 12 oz (86.5 kg)   SpO2 95%   BMI 32.74 kg/m  Wt Readings from Last 3 Encounters:  10/13/17 190 lb 12 oz (86.5 kg)  05/27/17 197 lb (89.4 kg)  03/18/17 196 lb  (88.9 kg)       Assessment & Plan:  1. Diarrhea, unspecified type - Cdiff NAA+O+P+Stool Culture - CBC with Differential - Comprehensive metabolic panel - discussed use of small amounts of immodium prn - RTC precautions reviewed  2. Weakness - CBC with Differential - Comprehensive metabolic panel  3. Tick bite of abdominal wall, initial encounter - tick not engorged, no fever/rash/headache/myalgias, do not suspect tick borne illness   Clarene Reamer, FNP-BC  [facility]  10/13/2017 8:17 AM

## 2017-10-13 NOTE — Patient Instructions (Signed)
Good to see you today  Try 1/2 tablet of over the counter Immodium for a couple of days then can increase to 1 tablet.   Stop at the lab to get your stool collection container and have blood work drawn. I will notify you of results in a couple of days.   If you get worse, please let us know.

## 2017-10-14 LAB — C. DIFFICILE GDH AND TOXIN A/B
GDH ANTIGEN: NOT DETECTED
MICRO NUMBER:: 90664627
SPECIMEN QUALITY:: ADEQUATE
TOXIN A AND B: NOT DETECTED

## 2017-10-15 ENCOUNTER — Other Ambulatory Visit: Payer: Self-pay | Admitting: Family Medicine

## 2017-10-15 DIAGNOSIS — R197 Diarrhea, unspecified: Secondary | ICD-10-CM

## 2017-10-15 DIAGNOSIS — B89 Unspecified parasitic disease: Secondary | ICD-10-CM

## 2017-10-16 ENCOUNTER — Other Ambulatory Visit: Payer: Medicare Other

## 2017-10-16 DIAGNOSIS — R197 Diarrhea, unspecified: Secondary | ICD-10-CM

## 2017-10-25 LAB — OVA AND PARASITE EXAMINATION
SPECIMEN QUALITY:: ADEQUATE
VKL: 90681789

## 2017-10-25 LAB — STOOL CULTURE
MICRO NUMBER: 90681787
MICRO NUMBER:: 90681788
MICRO NUMBER:: 90681790
SHIGA RESULT: NOT DETECTED
SPECIMEN QUALITY: ADEQUATE
SPECIMEN QUALITY: ADEQUATE
SPECIMEN QUALITY:: ADEQUATE

## 2017-10-25 LAB — CRYPTOSPORIDIUM ANTIGEN, STOOL
MICRO NUMBER: 90698829
RESULT:: NOT DETECTED
SPECIMEN QUALITY:: ADEQUATE

## 2017-10-25 LAB — CYCLOSPORA AND ISOSPORA EXAMINATION
CYCLOSPORA EXAM: NOT DETECTED
ISOSPORA EXAM: NOT DETECTED

## 2017-10-27 MED ORDER — TINIDAZOLE 500 MG PO TABS: 2.0000 g | ORAL_TABLET | Freq: Once | ORAL | 0 refills | Status: AC

## 2017-10-27 NOTE — Addendum Note (Signed)
Addended by: Clarene Reamer B on: 10/27/2017 05:16 PM   Modules accepted: Orders

## 2017-11-04 DIAGNOSIS — H0100A Unspecified blepharitis right eye, upper and lower eyelids: Secondary | ICD-10-CM | POA: Diagnosis not present

## 2017-11-04 DIAGNOSIS — H401132 Primary open-angle glaucoma, bilateral, moderate stage: Secondary | ICD-10-CM | POA: Diagnosis not present

## 2017-11-04 DIAGNOSIS — H0100B Unspecified blepharitis left eye, upper and lower eyelids: Secondary | ICD-10-CM | POA: Diagnosis not present

## 2017-11-06 DIAGNOSIS — D1801 Hemangioma of skin and subcutaneous tissue: Secondary | ICD-10-CM | POA: Diagnosis not present

## 2017-11-06 DIAGNOSIS — L821 Other seborrheic keratosis: Secondary | ICD-10-CM | POA: Diagnosis not present

## 2017-11-06 DIAGNOSIS — L820 Inflamed seborrheic keratosis: Secondary | ICD-10-CM | POA: Diagnosis not present

## 2017-11-06 DIAGNOSIS — D225 Melanocytic nevi of trunk: Secondary | ICD-10-CM | POA: Diagnosis not present

## 2017-11-06 DIAGNOSIS — L218 Other seborrheic dermatitis: Secondary | ICD-10-CM | POA: Diagnosis not present

## 2017-11-06 DIAGNOSIS — L570 Actinic keratosis: Secondary | ICD-10-CM | POA: Diagnosis not present

## 2017-11-06 DIAGNOSIS — L814 Other melanin hyperpigmentation: Secondary | ICD-10-CM | POA: Diagnosis not present

## 2017-12-21 ENCOUNTER — Other Ambulatory Visit: Payer: Self-pay | Admitting: Cardiovascular Disease

## 2018-01-01 ENCOUNTER — Encounter: Payer: Self-pay | Admitting: Cardiovascular Disease

## 2018-01-07 ENCOUNTER — Other Ambulatory Visit: Payer: Self-pay

## 2018-01-07 NOTE — Patient Outreach (Signed)
[facility] Care Management  01/07/2018  Chung Chagoya Delange 1940-09-29 [id number]   Medication Adherence call to Mr. Authur left a message for patient to call back patient is due on Olmesartan/ Hctz 40/[facility] mg. [facility] to Amlodipine because Olmesartan was on back order. Mr. Oki is showing past due under [place].   [facility] Management Direct Dial [phone]  Fax [phone] [email or web address]

## 2018-01-17 ENCOUNTER — Other Ambulatory Visit: Payer: Self-pay | Admitting: Cardiovascular Disease

## 2018-01-26 ENCOUNTER — Encounter: Payer: Self-pay | Admitting: Cardiovascular Disease

## 2018-01-26 ENCOUNTER — Ambulatory Visit: Payer: Medicare Other | Admitting: Cardiovascular Disease

## 2018-01-26 VITALS — BP 150/52 | HR 59 | Ht 64.0 in | Wt 190.0 lb

## 2018-01-26 DIAGNOSIS — I2510 Atherosclerotic heart disease of native coronary artery without angina pectoris: Secondary | ICD-10-CM

## 2018-01-26 DIAGNOSIS — I10 Essential (primary) hypertension: Secondary | ICD-10-CM | POA: Diagnosis not present

## 2018-01-26 LAB — BASIC METABOLIC PANEL
BUN/Creatinine Ratio: 17 (ref 10–24)
BUN: 24 mg/dL (ref 8–27)
CALCIUM: 9.6 mg/dL (ref 8.6–10.2)
CO2: 22 mmol/L (ref 20–29)
CREATININE: 1.42 mg/dL — AB (ref 0.76–1.27)
Chloride: 102 mmol/L (ref 96–106)
GFR calc Af Amer: 55 mL/min/{1.73_m2} — ABNORMAL LOW (ref >59)
GFR, EST NON AFRICAN AMERICAN: 47 mL/min/{1.73_m2} — AB (ref >59)
Glucose: 109 mg/dL — ABNORMAL HIGH (ref 65–99)
POTASSIUM: 4.6 mmol/L (ref 3.5–5.2)
Sodium: 137 mmol/L (ref 134–144)

## 2018-01-26 LAB — LIPID PANEL
CHOLESTEROL TOTAL: 136 mg/dL (ref 100–199)
Chol/HDL Ratio: 2.8 ratio (ref 0.0–5.0)
HDL: 49 mg/dL (ref >39)
LDL Calculated: 67 mg/dL (ref 0–99)
TRIGLYCERIDES: 102 mg/dL (ref 0–149)
VLDL Cholesterol Cal: 20 mg/dL (ref 5–40)

## 2018-01-26 LAB — HEPATIC FUNCTION PANEL
ALBUMIN: 4.6 g/dL (ref 3.5–4.8)
ALT: 15 IU/L (ref 0–44)
AST: 18 IU/L (ref 0–40)
Alkaline Phosphatase: 92 IU/L (ref 39–117)
BILIRUBIN TOTAL: 0.4 mg/dL (ref 0.0–1.2)
BILIRUBIN, DIRECT: 0.15 mg/dL (ref 0.00–0.40)
Total Protein: 6.8 g/dL (ref 6.0–8.5)

## 2018-01-26 MED ORDER — OLMESARTAN MEDOXOMIL-HCTZ 40-25 MG PO TABS: 1.0000 | ORAL_TABLET | Freq: Every day | ORAL | 3 refills | Status: DC

## 2018-01-26 NOTE — Patient Instructions (Signed)
Medication Instructions:  Your physician recommends that you continue on your current medications as directed. Please refer to the Current Medication list given to you today.   Labwork: TODAY - cholesterol, liver panel, basic metabolic panel   Testing/Procedures: None Ordered   Follow-Up: Your physician wants you to follow-up in: 1 year with Dr. Nahser.  You will receive a reminder letter in the mail two months in advance. If you don't receive a letter, please call our office to schedule the follow-up appointment.   If you need a refill on your cardiac medications before your next appointment, please call your pharmacy.   Thank you for choosing [facility]! Joya Willmott, RN [phone]    

## 2018-01-26 NOTE — Progress Notes (Signed)
Patrick [name] Date of Birth  05-28-1940       [facility]    [employer] [address], [place], [place]  [address]   [place], [place]  [address] [phone]     [phone]   Fax  [phone]    Fax [phone]  Problem List: 1. Coronary artery disease-moderate.  He's had a previous stent placed. 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 A subsequent Myoview study was normal. 2. Hypertension 3. Hyperlipidemia 4. Sleep apnea     Patrick [name] is doing very will.   He has been on a mission trip and to [employer].  No further episodes of chest pain.   He hauled 5 loads of hogs last week - his brother raises the hogs.  He is planning on going to [place] over Christmas.  He still does his lawn care business.  He walks at the [employer] - 3 miles a day.  Without symptoms.  November 09, 2012:  Patrick [name] is doing well.  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   He is a retired Administrator.   Still walking 3 days a week.    Oct. 9, 2015:  Still very active - mows 8-9 yards a week.  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  No angina. BP is high. Has had a cold - is taking chlorpheneramine  Has had normal Bp at home   Sep 19, 2014:   Still very active.  Drives a truck.  Mows and trims 10 yards a week without any problems.  Has atypical  chest pain with mental stress but never with physical exertion.  Does not restrict his diet.   Nov. 18, 2016  Still working hard ( driving a truck and mowing yards)  Gets more short of breath compared to several years ago  Still eats some salty foods.   Has a lady who is cooking for him  - knows that she puts lots of salt in the food  October 16, 2015:  Patrick [name] is seen back for his CAD, HTN, HLD . OSA  Just got back from a cruise in [place] .  Still very active.   Mows yards, still drives trucks.     Dec. 4, 2017:  Patrick [name] is seen today with  [facility]. Still drives his truck and mows regularly .   No real CP or dyspnea.   Very rare episodes of chest tightness.  Keeps his BP at home.  Seems to go up when he comes to the doctor .   October 31, 2016:  Patrick [name] is seen today for follow up  Had back surgery in January .   Back pain has returned . Still very busy,   Mows frequently .   Has a [employer] that he loves but seems to contribute to his back pain.   No angnia  BP at home has been mildly elevated.   Admits that he eats more salt than he should .   Jan. 15, 2019:   BP is a little elevate.  No dyspnea. , no CP  Eating a bit of extra salt.  Has not been exercising .   January 26, 2018: Doing well BP at home has been good . Keeps a BP log. BP is a bit high today  - ate some hotdogs this weekend .  No CP . Exercising some .  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 and [place]  Did lots of walking in [place]   Current Outpatient Medications on File Prior to Visit  Medication Sig Dispense Refill  . amLODipine (NORVASC) 5 MG tablet TAKE 1 TABLET BY MOUTH DAILY 90 tablet 3  . aspirin 81 MG tablet Take 81 mg by mouth daily.      . Coenzyme Q10 (CO Q-10) 100 MG CAPS Take 2 capsules by mouth daily.     . fish oil-omega-3 fatty acids 1000 MG capsule Take 1 g by mouth daily.     Marland Kitchen latanoprost (XALATAN) 0.005 % ophthalmic solution Place 1 drop into both eyes at bedtime.     . nitroGLYCERIN (NITROSTAT) 0.4 MG SL tablet Place 1 tablet (0.4 mg total) under the tongue every 5 (five) minutes x 3 doses as needed for chest pain. 25 tablet 1  . olmesartan-hydrochlorothiazide (BENICAR HCT) 40-25 MG tablet Take 1 tablet by mouth daily.    Marland Kitchen omeprazole (PRILOSEC) 40 MG capsule Take 1 capsule by mouth daily.    . rosuvastatin (CRESTOR) 20 MG tablet TAKE ONE-HALF TABLET BY MOUTH ONCE DAILY 45 tablet 1  . triamcinolone cream (KENALOG) 0.1 % 1 APPLICATION APPLY ON THE SKIN AS DIRECTED  APPLY TO AFFECTED AREAS AS NEEDED AS DIRECTED  0   No current facility-administered medications on file prior to visit.     Allergies  Allergen Reactions  . Atorvastatin Other (See Comments)    MYALGIAS, LEG CRAMPING  . Ezetimibe-Simvastatin Other (See Comments)    MYALGIAS, CRAMPING  . Lisinopril Cough  . Pravastatin Other (See Comments)    Myalgias at 40mg  dosage    Past Medical History:  Diagnosis Date  . Allergy   . Aortic root dilatation (HCC)    Mild by echo 11/2011 (34mm)  . BPH (benign prostatic hypertrophy)   . Bradycardia    Precluding BB use.  Marland Kitchen CAD (coronary artery disease)    a. s/p Taxus DES to RCA and PDA in 2006 .  // b. Myoview 8/11: Walked 12:37. EF 57%. no ischemia or scar.  // c. Mod CAD by cath 11/2011 (40-50% ISR of PDA -- for OP myoview to assess).  //  d. Myoview 3/18: EF 58, no ischemia; Low Risk  . Colon polyps   . Complication of anesthesia    "they said they about lost, me when I had eye surgery"  . Diverticulitis   . GERD (gastroesophageal reflux disease)   . Glaucoma   . Hyperlipidemia   . Hypertension   . Impaired fasting glucose   . Sleep apnea   . Syncope     Past Surgical History:  Procedure Laterality Date  . ANGIOPLASTY     percutaneous transluminal coronary  angioplasty and stenting of the right coronary artery and posterior   . CATARACT EXTRACTION W/ INTRAOCULAR LENS  IMPLANT, BILATERAL  9/15  . COLONOSCOPY    . INTRAOCULAR LENS INSERTION    . LEFT HEART CATHETERIZATION WITH CORONARY ANGIOGRAM N/A 11/26/2011   Procedure: LEFT HEART CATHETERIZATION WITH CORONARY ANGIOGRAM;  Surgeon: Thayer Headings, MD;  Location: [facility] CATH LAB;  Service: Cardiovascular;  Laterality: N/A;  . LUMBAR LAMINECTOMY/DECOMPRESSION MICRODISCECTOMY Left 05/27/2016   Procedure: Laminectomy for facet/synovial cyst - Lumbar four - Lumbar five - left;  Surgeon: Kary Kos, MD;  Location: [facility];  Service: Neurosurgery;  Laterality: Left;  Laminectomy for facet/synovial  cyst -  Lumbar four - Lumbar five - left  . PILONIDAL CYST / SINUS EXCISION      Social History   Tobacco Use  Smoking Status Former Smoker  . Years: 30.00  . Last attempt to quit: 05/13/1978  . Years since quitting: 39.7  Smokeless Tobacco Never Used    Social History   Substance and Sexual Activity  Alcohol Use Yes  . Alcohol/week: 14.0 standard drinks  . Types: 14 Glasses of wine per week    Family History  Problem Relation Age of Onset  . Coronary artery disease Father   . Heart disease Father   . Heart failure Father   . Cancer Mother        ovarian cancer  . Hypertension Neg Hx   . Diabetes Neg Hx     Reviw of Systems:  Noted in current history, otherwise systems are negative.  Physical Exam: Blood pressure (!) 150/52, pulse (!) 59, height 5\' 4"  (1.626 m), weight 190 lb (86.2 kg), SpO2 94 %.  GEN:  Well nourished, well developed in no acute distress HEENT: Normal NECK: No JVD; No carotid bruits LYMPHATICS: No lymphadenopathy CARDIAC: RRR , no murmurs, rubs, gallops RESPIRATORY:  Clear to auscultation without rales, wheezing or rhonchi  ABDOMEN: Soft, non-tender, non-distended MUSCULOSKELETAL:  No edema; No deformity  SKIN: Warm and dry NEUROLOGIC:  Alert and oriented x 3   ECG:    Assessment / Plan:   1. Coronary artery disease-moderate.   He is not having any episodes of angina.  Continue current medications.  2. Hypertension-     blood pressure is generally well controlled.  He ate several hotdogs yesterday and so his blood pressure is little higher than normal.  He keeps a blood pressure log at home and his blood pressure is typically well controlled.  4. Sleep apnea-   well-controlled.  Mertie Moores, MD  01/26/2018 8:03 AM    [address],  [place], [place]  [address] Pager [phone] Phone: [phone]; Fax: [phone]

## 2018-02-14 ENCOUNTER — Other Ambulatory Visit: Payer: Self-pay | Admitting: Cardiovascular Disease

## 2018-02-16 NOTE — Telephone Encounter (Signed)
Outpatient Medication Detail    Disp Refills Start End   olmesartan-hydrochlorothiazide (BENICAR HCT) 40-25 MG tablet 90 tablet 3 01/26/2018    Sig - Route: Take 1 tablet by mouth daily. - Oral   Sent to pharmacy as: olmesartan-hydrochlorothiazide (BENICAR HCT) 40-25 MG tablet   E-Prescribing Status: Receipt confirmed by pharmacy (01/26/2018 8:15 AM EDT)   Pharmacy   [id number] [place], [place]

## 2018-02-16 NOTE — Telephone Encounter (Signed)
Outpatient Medication Detail    Disp Refills Start End   olmesartan-hydrochlorothiazide (BENICAR HCT) 40-25 MG tablet 90 tablet 3 01/26/2018    Sig - Route: Take 1 tablet by mouth daily. - Oral   Sent to pharmacy as: olmesartan-hydrochlorothiazide (BENICAR HCT) 40-25 MG tablet   E-Prescribing Status: Receipt confirmed by pharmacy (01/26/2018 8:15 AM EDT)   Pharmacy   [id number] [place], [place]

## 2018-03-02 DIAGNOSIS — Z961 Presence of intraocular lens: Secondary | ICD-10-CM | POA: Diagnosis not present

## 2018-03-20 ENCOUNTER — Ambulatory Visit: Payer: Medicare Other

## 2018-03-20 ENCOUNTER — Encounter: Payer: Medicare Other | Admitting: Internal Medicine

## 2018-03-20 ENCOUNTER — Other Ambulatory Visit: Payer: Self-pay | Admitting: Internal Medicine

## 2018-05-04 DIAGNOSIS — H43813 Vitreous degeneration, bilateral: Secondary | ICD-10-CM | POA: Diagnosis not present

## 2018-05-04 DIAGNOSIS — H35371 Puckering of macula, right eye: Secondary | ICD-10-CM | POA: Diagnosis not present

## 2018-05-04 DIAGNOSIS — H401132 Primary open-angle glaucoma, bilateral, moderate stage: Secondary | ICD-10-CM | POA: Diagnosis not present

## 2018-05-04 DIAGNOSIS — H26491 Other secondary cataract, right eye: Secondary | ICD-10-CM | POA: Diagnosis not present

## 2018-06-04 DIAGNOSIS — H26491 Other secondary cataract, right eye: Secondary | ICD-10-CM | POA: Diagnosis not present

## 2018-06-12 ENCOUNTER — Other Ambulatory Visit: Payer: Self-pay | Admitting: Cardiovascular Disease

## 2018-07-01 ENCOUNTER — Encounter: Payer: Self-pay | Admitting: Internal Medicine

## 2018-07-01 ENCOUNTER — Ambulatory Visit (INDEPENDENT_AMBULATORY_CARE_PROVIDER_SITE_OTHER): Payer: Medicare Other | Admitting: Internal Medicine

## 2018-07-01 VITALS — BP 138/70 | HR 55 | Temp 97.4°F | Ht 67.5 in | Wt 192.0 lb

## 2018-07-01 DIAGNOSIS — I10 Essential (primary) hypertension: Secondary | ICD-10-CM

## 2018-07-01 DIAGNOSIS — K219 Gastro-esophageal reflux disease without esophagitis: Secondary | ICD-10-CM

## 2018-07-01 DIAGNOSIS — N183 Chronic kidney disease, stage 3 unspecified: Secondary | ICD-10-CM

## 2018-07-01 DIAGNOSIS — I2510 Atherosclerotic heart disease of native coronary artery without angina pectoris: Secondary | ICD-10-CM

## 2018-07-01 DIAGNOSIS — Z0000 Encounter for general adult medical examination without abnormal findings: Secondary | ICD-10-CM | POA: Diagnosis not present

## 2018-07-01 DIAGNOSIS — N400 Enlarged prostate without lower urinary tract symptoms: Secondary | ICD-10-CM

## 2018-07-01 DIAGNOSIS — I77810 Thoracic aortic ectasia: Secondary | ICD-10-CM

## 2018-07-01 DIAGNOSIS — N1831 Chronic kidney disease, stage 3a: Secondary | ICD-10-CM | POA: Insufficient documentation

## 2018-07-01 DIAGNOSIS — Z7189 Other specified counseling: Secondary | ICD-10-CM

## 2018-07-01 MED ORDER — KETOCONAZOLE 2 % EX SHAM: 1.0000 "application " | MEDICATED_SHAMPOO | CUTANEOUS | 5 refills | Status: DC

## 2018-07-01 NOTE — Assessment & Plan Note (Signed)
Last GFR 47 Is on ARB

## 2018-07-01 NOTE — Assessment & Plan Note (Signed)
Mild No Rx needed 

## 2018-07-01 NOTE — Assessment & Plan Note (Signed)
BP Readings from Last 3 Encounters:  07/01/18 138/70  01/26/18 (!) 150/52  10/13/17 132/62   Good control

## 2018-07-01 NOTE — Progress Notes (Signed)
Hearing Screening   Method: Audiometry   125Hz  250Hz  500Hz  1000Hz  2000Hz  3000Hz  4000Hz  6000Hz  8000Hz   Right ear:   40 40 40  0    Left ear:   40 0 40  0    Comments: January 2020

## 2018-07-01 NOTE — Assessment & Plan Note (Signed)
Quiet on the med

## 2018-07-01 NOTE — Assessment & Plan Note (Signed)
See social history 

## 2018-07-01 NOTE — Assessment & Plan Note (Signed)
Some vague chest tightness doesn't really sound anginal No changes

## 2018-07-01 NOTE — Progress Notes (Signed)
Subjective:    Patient ID: Patrick [name], male    DOB: Jun 16, 1940, 78 y.o.   MRN: [id number]  HPI Here for Medicare wellness visit and follow up of chronic health conditions Reviewed form and advanced directives Reviewed other doctors Will have 1-2 glasses of red wine most days No tobacco Some exercise No falls No depression or anhedonia Vision is okay Hearing is not great---not ready for aide Independent with instrumental ADLs Mild memory issues---trouble with names  No problems with heart Will get occasional tightness in chest ---not really related to exertion No nausea, diaphoresis, SOB with this No change in exercise tolerance Still hauls in truck a little Walks at park--but not as often Reviewed echo from some years ago--mildly dilated aortic root (doesn't seem too worrisome)  Continues on the omeprazole Props at night--tries to eat light and early at night No dysphagia  Voids okay Nocturia stable---x 1 Flow is okay--empties well  Reviewed recent labs GFR 47  Current Outpatient Medications on File Prior to Visit  Medication Sig Dispense Refill  . amLODipine (NORVASC) 5 MG tablet TAKE 1 TABLET BY MOUTH DAILY 90 tablet 1  . aspirin 81 MG tablet Take 81 mg by mouth daily.      . Coenzyme Q10 (CO Q-10) 100 MG CAPS Take 2 capsules by mouth daily.     . fish oil-omega-3 fatty acids 1000 MG capsule Take 1 g by mouth daily.     Marland Kitchen latanoprost (XALATAN) 0.005 % ophthalmic solution Place 1 drop into both eyes at bedtime.     . nitroGLYCERIN (NITROSTAT) 0.4 MG SL tablet Place 1 tablet (0.4 mg total) under the tongue every 5 (five) minutes x 3 doses as needed for chest pain. 25 tablet 1  . olmesartan-hydrochlorothiazide (BENICAR HCT) 40-25 MG tablet Take 1 tablet by mouth daily. 90 tablet 3  . omeprazole (PRILOSEC) 40 MG capsule Take 1 capsule by mouth daily.    . rosuvastatin (CRESTOR) 20 MG tablet TAKE ONE-HALF TABLET BY MOUTH ONCE DAILY 45 tablet 2  . triamcinolone cream  (KENALOG) 0.1 % 1 APPLICATION APPLY ON THE SKIN AS DIRECTED APPLY TO AFFECTED AREAS AS NEEDED AS DIRECTED  0   No current facility-administered medications on file prior to visit.     Allergies  Allergen Reactions  . Atorvastatin Other (See Comments)    MYALGIAS, LEG CRAMPING  . Ezetimibe-Simvastatin Other (See Comments)    MYALGIAS, CRAMPING  . Lisinopril Cough  . Pravastatin Other (See Comments)    Myalgias at 40mg  dosage    Past Medical History:  Diagnosis Date  . Allergy   . Aortic root dilatation (HCC)    Mild by echo 11/2011 (60mm)  . BPH (benign prostatic hypertrophy)   . Bradycardia    Precluding BB use.  Marland Kitchen CAD (coronary artery disease)    a. s/p Taxus DES to RCA and PDA in 2006 .  // b. Myoview 8/11: Walked 12:37. EF 57%. no ischemia or scar.  // c. Mod CAD by cath 11/2011 (40-50% ISR of PDA -- for OP myoview to assess).  //  d. Myoview 3/18: EF 58, no ischemia; Low Risk  . Colon polyps   . Complication of anesthesia    "they said they about lost, me when I had eye surgery"  . Diverticulitis   . GERD (gastroesophageal reflux disease)   . Glaucoma   . Hyperlipidemia   . Hypertension   . Impaired fasting glucose   . Sleep apnea   .  Syncope     Past Surgical History:  Procedure Laterality Date  . ANGIOPLASTY     percutaneous transluminal coronary  angioplasty and stenting of the right coronary artery and posterior   . CATARACT EXTRACTION W/ INTRAOCULAR LENS  IMPLANT, BILATERAL  9/15  . COLONOSCOPY    . INTRAOCULAR LENS INSERTION    . LEFT HEART CATHETERIZATION WITH CORONARY ANGIOGRAM N/A 11/26/2011   Procedure: LEFT HEART CATHETERIZATION WITH CORONARY ANGIOGRAM;  Surgeon: Thayer Headings, MD;  Location: [facility] CATH LAB;  Service: Cardiovascular;  Laterality: N/A;  . LUMBAR LAMINECTOMY/DECOMPRESSION MICRODISCECTOMY Left 05/27/2016   Procedure: Laminectomy for facet/synovial cyst - Lumbar four - Lumbar five - left;  Surgeon: Kary Kos, MD;  Location: [facility];  Service:  Neurosurgery;  Laterality: Left;  Laminectomy for facet/synovial cyst - Lumbar four - Lumbar five - left  . PILONIDAL CYST / SINUS EXCISION      Family History  Problem Relation Age of Onset  . Coronary artery disease Father   . Heart disease Father   . Heart failure Father   . Cancer Mother        ovarian cancer  . Hypertension Neg Hx   . Diabetes Neg Hx     Social History   Socioeconomic History  . Marital status: Divorced    Spouse name: Not on file  . Number of children: 3  . Years of education: Not on file  . Highest education level: Not on file  Occupational History  . 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 Financial resource strain: Not on file  . Food insecurity:    Worry: Not on file    Inability: Not on file  . Transportation needs:    Medical: Not on file    Non-medical: Not on file  Tobacco Use  . Smoking status: Former Smoker    Years: 30.00    Last attempt to quit: 05/13/1978    Years since quitting: 40.1  . Smokeless tobacco: Never Used  Substance and Sexual Activity  . Alcohol use: Yes    Alcohol/week: 14.0 standard drinks    Types: 14 Glasses of wine per week  . Drug use: No  . Sexual activity: Yes  Lifestyle  . Physical activity:    Days per week: Not on file    Minutes per session: Not on file  . Stress: Not on file  Relationships  . Social connections:    Talks on phone: Not on file    Gets together: Not on file    Attends religious service: Not on file    Active member of club or organization: Not on file    Attends meetings of clubs or organizations: Not on file    Relationship status: Not on file  . Intimate partner violence:    Fear of current or ex partner: Not on file    Emotionally abused: Not on file    Physically abused: Not on file    Forced sexual activity: Not on file  Other Topics Concern  . Not on file  Social History Narrative   No living will   Daughter Benjamine Mola should make medical  decisions if he can't   Would accept resuscitation   Wouldn't want prolonged tube feeds if cognitively unaware   Review of Systems Synovial cyst removed from lumbar facet--no problems since then Still with bend in penis--sex not part of his current relationship Appetite is good Weight stable Sleeps well Wears  seat belt Lost a wisdom tooth recently--was abscessed. Keeps up with dentist Keeps up with dermatologist. No current rash--but has some itching/scaling on scalp Bowels are fine--no blood Stress with son--had stroke and now home for comfort care    Objective:   Physical Exam  Constitutional: He is oriented to person, place, and time. He appears well-developed. No distress.  HENT:  Mouth/Throat: Oropharynx is clear and moist. No oropharyngeal exudate.  Neck: No thyromegaly present.  Cardiovascular: Normal rate, regular rhythm, normal heart sounds and intact distal pulses. Exam reveals no gallop.  No murmur heard. Respiratory: Effort normal and breath sounds normal. No respiratory distress. He has no wheezes. He has no rales.  GI: Soft. There is no abdominal tenderness.  Musculoskeletal:        General: No tenderness or edema.  Lymphadenopathy:    He has no cervical adenopathy.  Neurological: He is alert and oriented to person, place, and time.  President--- "Gerarda Fraction Trump, Obama, Bush" [phone] [id number] Recall 3/3  Skin: No rash noted. No erythema.  Psychiatric: He has a normal mood and affect. His behavior is normal.           Assessment & Plan:

## 2018-07-01 NOTE — Assessment & Plan Note (Signed)
I have personally reviewed the Medicare Annual Wellness questionnaire and have noted  1. The patient's medical and social history  2. Their use of alcohol, tobacco or illicit drugs  3. Their current medications and supplements  4. The patient's functional ability including ADL's, fall risks, home safety risks and hearing or visual              impairment.  5. Diet and physical activities  6. Evidence for depression or mood disorders  The patients weight, height, BMI and visual acuity have been recorded in the chart  I have made referrals, counseling and provided education to the patient based review of the above and I have provided the pt with a written personalized care plan for preventive services.   I have provided you with a copy of your personalized plan for preventive services. Please take the time to review along with your updated medication list.  Yearly flu vaccine Discussed increasing exercise Consider shingrix---at pharmacy. Is on list Repeat colon due at age 77---will likely decide against (but will wait) No PSA due to age

## 2018-07-01 NOTE — Assessment & Plan Note (Signed)
Very mild on echo On statin and ASA Not sure he needs recheck

## 2018-08-17 IMAGING — CT CT L SPINE W/O CM
2 of 3 series · 14 of 33 positions shown · non-contrast
Comparison: 05/20/2016 chest x-ray. No comparison lumbar spine
exam.

CLINICAL DATA: 75-year-old male fell backwards 04/11/2016. Still
having back pain extending into left buttock region and down left
leg. History of cough, fever and chills. Subsequent encounter.

EXAM:
CT LUMBAR SPINE WITHOUT CONTRAST
TECHNIQUE: Multidetector CT imaging of the lumbar spine was performed without
intravenous contrast administration. Multiplanar CT image
reconstructions were also generated.

[Series 207: st coronal · coronal · 0.36mm/px · 3 of 58 slices shown]
[im 12/58  bone]
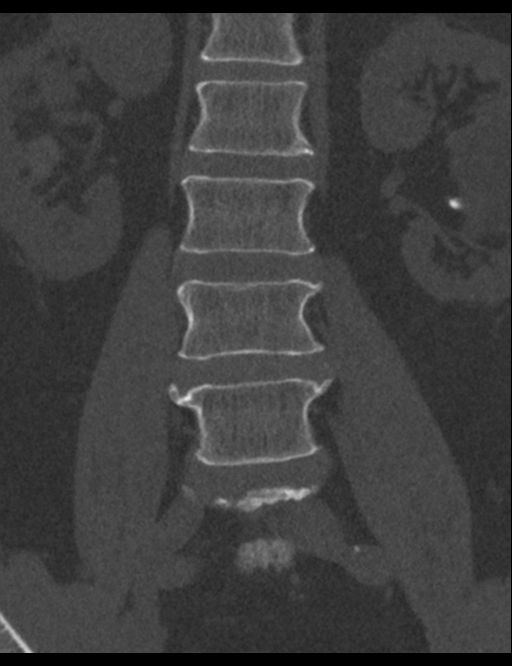
[im 23/58  bone]
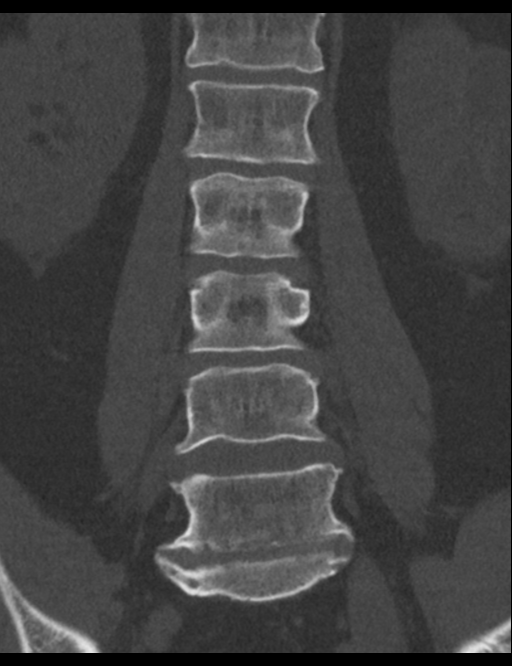
[im 35/58  bone]
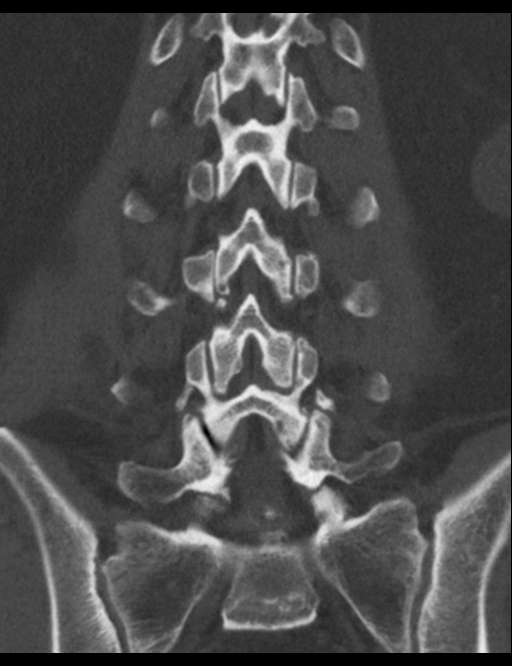

[Series 208: sagittal st · sagittal · 0.36mm/px · 11 of 50 slices shown]
[im 5/50  bone]
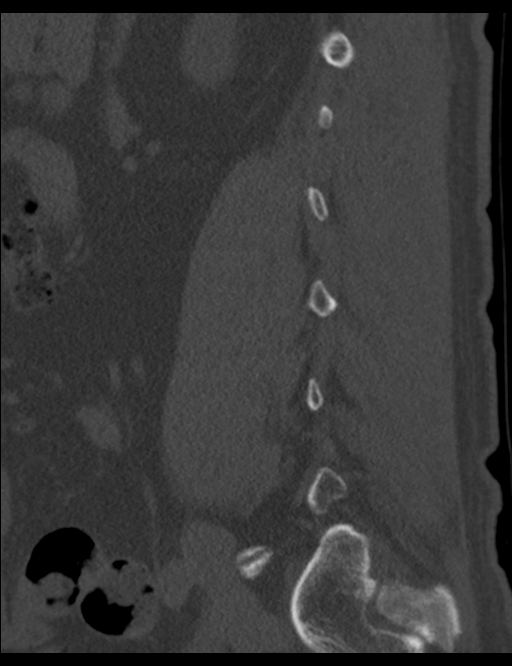
[im 9/50  bone]
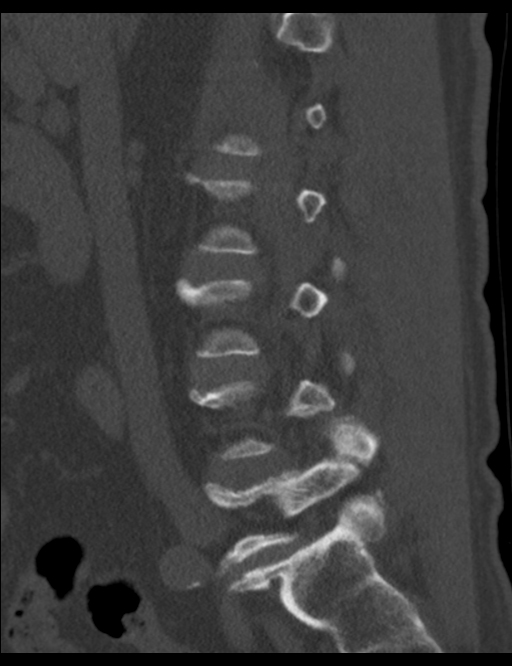
[im 13/50  bone]
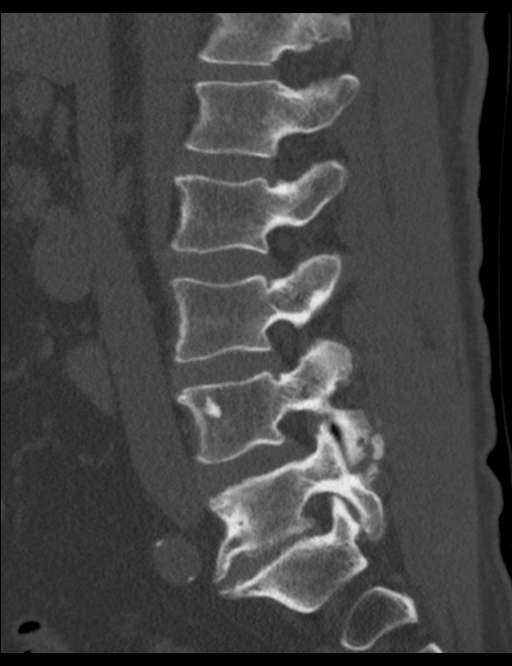
[im 17/50  bone]
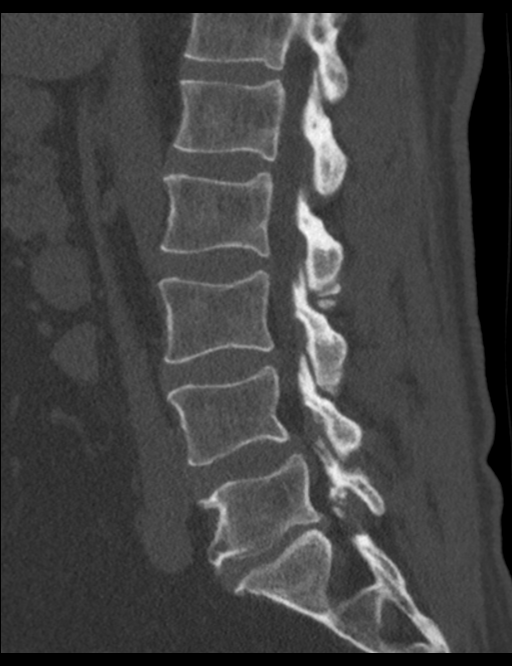
[im 21/50  bone]
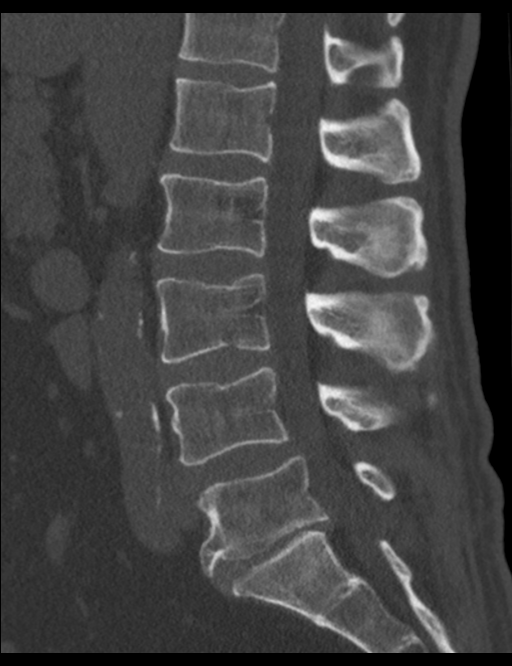
[im 25/50  bone]
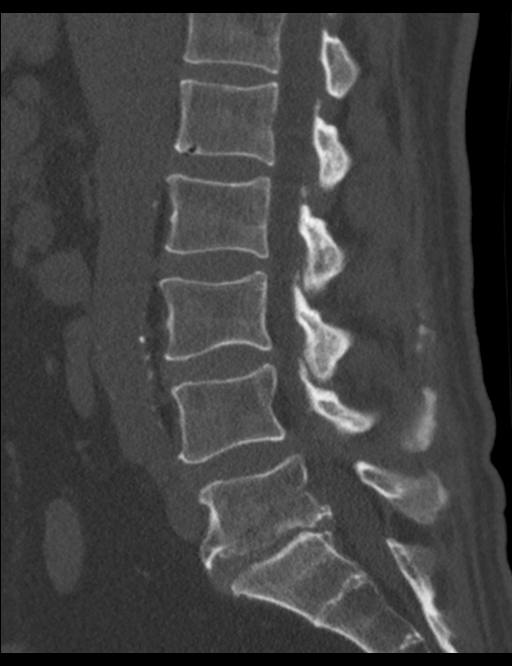
[im 29/50  bone]
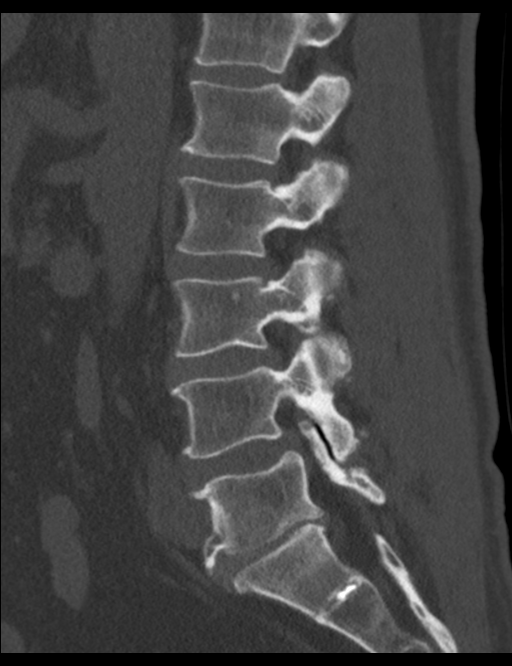
[im 33/50  bone]
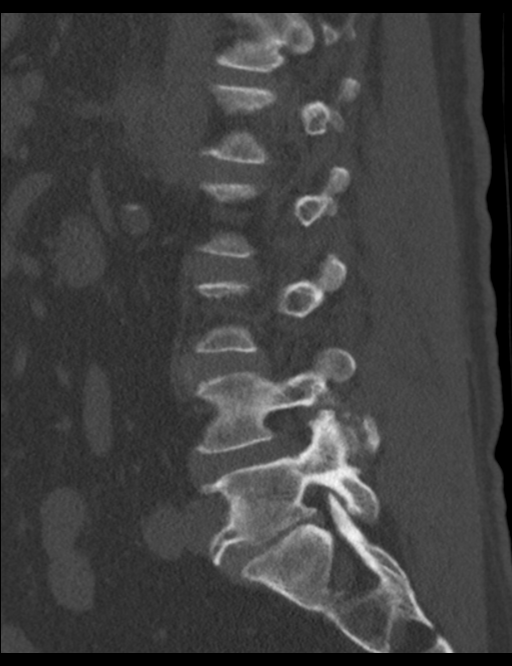
[im 37/50  bone]
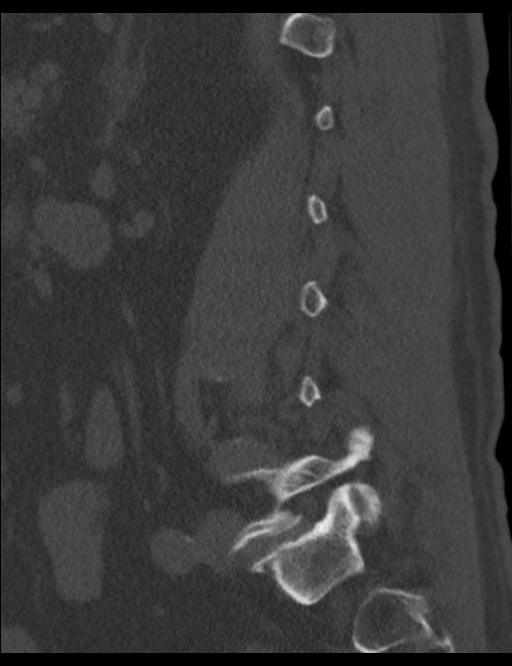
[im 41/50  bone]
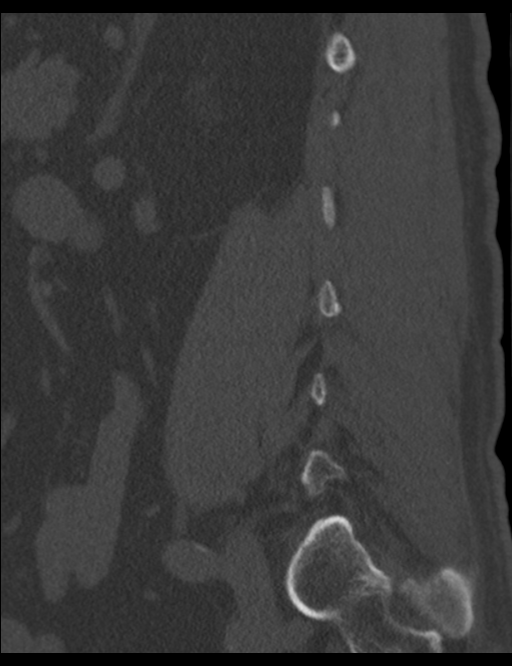
[im 45/50  bone]
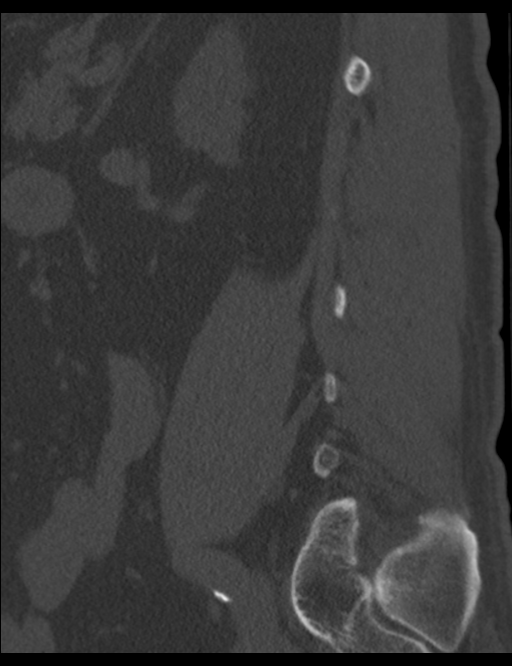

[14 of 33 positions shown; findings below may reference images not displayed]

FINDINGS: Segmentation: Last fully open disk space is labeled L5-S1. Present
examination incorporates from T12-L1 disc space through the S2-S3
level.

Alignment: Mild scoliosis convex right.

Vertebrae: Small sclerotic focus left aspect L3 vertebral body,
right aspect L4 vertebral body and medial aspect left iliac possibly
small bone islands. No report history of prostate cancer to suggest
these may represent sclerotic metastatic lesions. Small nonspecific
lucencies of the ilium. Mild sacroiliac joint degenerative changes
noted bilaterally.

Paraspinal and other soft tissues: Right liver cyst incompletely
assessed. Large left renal cysts including parapelvic cysts. Small
right renal cyst. Within the anterior aspect of the mid to lower
right kidney is a 1.6 cm hyperdense structure which could represent
a hyperdense cyst but would require dedicated contrast enhanced
renal MR to confirm such and exclude a hyperdense mass.

Nonobstructing lower pole renal calculi bilateral.

Atherosclerotic changes of the aorta and iliac arteries without
aneurysmal dilation.

L5-S1 endplate reactive changes most consistent with degenerative
changes without surrounding findings to suggest
discitis/osteomyelitis.

Disc levels:

T12-L1:  Negative.

L1-2:  Negative.

L2-3: Minimal Schmorl's node deformity. Minimal bulge. Very mild
spinal stenosis.

L3-4: Bulge. Facet degenerative changes. Slightly short pedicles
multifactorial mild to slightly moderate spinal stenosis.

L4-5: Prominent bilateral facet degenerative changes. 2 mm anterior
slip L4. Moderate bulge slightly greater to left. Dorsal epidural
fat. Slightly short pedicles. Multifactorial moderate to marked
bilateral lateral recess stenosis and moderate to marked spinal
stenosis. Mild left foraminal narrowing.

L5-S1: Disc degeneration with endplate reactive changes. Moderate
bulge and osteophyte with slight contact with the upper S1 nerve
roots greater on the right. No significant thecal sac compromise.
Mild foraminal narrowing greater on the left.
IMPRESSION: Summary of pertinent findings includes:

L4-5 multifactorial moderate to marked bilateral lateral recess
stenosis and moderate to marked spinal stenosis. Mild left foraminal
narrowing.

L3-4 multifactorial mild to slightly moderate spinal stenosis.

L5-S1 moderate bulge and osteophyte with slight contact with the
upper S1 nerve roots greater on the right. No significant thecal sac
compromise. Mild foraminal narrowing greater on the left.

L2-3 very mild spinal stenosis.

Please see above for further detail.

Within the anterior aspect of the mid to lower right kidney is a
cm hyperdense structure which could represent a hyperdense cyst but
would require dedicated contrast enhanced renal MR to confirm such
and exclude a hyperdense mass.

## 2018-09-22 ENCOUNTER — Other Ambulatory Visit: Payer: Self-pay | Admitting: Internal Medicine

## 2018-10-12 ENCOUNTER — Telehealth: Payer: Self-pay | Admitting: Cardiovascular Disease

## 2018-10-12 DIAGNOSIS — I2510 Atherosclerotic heart disease of native coronary artery without angina pectoris: Secondary | ICD-10-CM

## 2018-10-12 DIAGNOSIS — E785 Hyperlipidemia, unspecified: Secondary | ICD-10-CM

## 2018-10-12 DIAGNOSIS — I25118 Atherosclerotic heart disease of native coronary artery with other forms of angina pectoris: Secondary | ICD-10-CM

## 2018-10-12 MED ORDER — NITROGLYCERIN 0.4 MG SL SUBL: 0.4000 mg | SUBLINGUAL_TABLET | SUBLINGUAL | 6 refills | Status: DC | PRN

## 2018-10-12 MED ORDER — ISOSORBIDE MONONITRATE ER 30 MG PO TB24: 30.0000 mg | ORAL_TABLET | Freq: Every day | ORAL | 11 refills | Status: DC

## 2018-10-12 NOTE — Telephone Encounter (Signed)
Lets start Imdur 30 mg a day  Order [employer] Will see him as a virtual visit in the next week or so

## 2018-10-12 NOTE — Telephone Encounter (Signed)
Left detailed message for patient that lexiscan is scheduled for Thursday June 4. I included pre-test instructions and advised him to call back with questions or to reschedule.

## 2018-10-12 NOTE — Telephone Encounter (Signed)
New message    Pt c/o of Chest Pain: STAT if CP now or developed within 24 hours  1. Are you having CP right now? Chest tightness, no not right now  2. Are you experiencing any other symptoms (ex. SOB, nausea, vomiting, sweating)? Feels tired, he did some work a week ago and is still feeling the chest tightness, some SOB  3. How long have you been experiencing CP? Two days, last Thursday, Friday he rested and says he is about back to normal   4. Is your CP continuous or coming and going? It stayed throughout the night and into the next day   5. Have you taken Nitroglycerin? No  ?

## 2018-10-12 NOTE — Telephone Encounter (Signed)
Spoke with patient who states he worked hard in the sun for several hours about 3 weeks ago and had chest tightness that lasted until the following day. This past Thursday, he had the same experience after working outside again. He states the tightness resolved on Friday, about mid day. He did not take any NTG. States he feels good today, has rested since Thursday. He admits he only drank 2 bottles of water on each of these days.  Denies discomfort between the 2 events Was in [place] in early March and hiked difficult trails, had difficulty with needing to stop and bend over to catch his breath Still doing yard work, can walk 3 miles without difficulty Denies nausea, diaphoresis; no palpitations/fluttering Reports BP has been good:  5/29 126/57 mmHg; 5/30 123/67 mmHg; 6/1 139/64 mmHg, pulse 63 bpm I refilled his NTG for him to pick up today because he states he does not know where his most recently filled Rx bottle is located. He has hx of CAD w/stent x 1; last cardiac cath 2013. I advised that I will forward message to Dr. [name] for advice and will call him back later today. He verbalized understanding and agreement with plan and thanked me for the call.

## 2018-10-12 NOTE — Telephone Encounter (Signed)
Sent message earlier today to scheduling to determine date and time of patient's lexiscan.  Called patient and reviewed Dr. Elmarie Shiley advice. He verbalized understanding and agreement and is aware of the different prescriptions sent to pharmacy for isosorbide mononitrate and NTG SL. He will await call regarding schedule for lexiscan and agrees to call back or proceed to ED if symptoms worsen or if chest pain persists without relief. He thanked me for the call.

## 2018-10-13 ENCOUNTER — Telehealth: Payer: Self-pay | Admitting: Cardiovascular Disease

## 2018-10-13 ENCOUNTER — Telehealth (HOSPITAL_COMMUNITY): Payer: Self-pay

## 2018-10-13 NOTE — Telephone Encounter (Signed)
New Message    Pt is calling and wondering if someone can come with him to his appt    Please call back

## 2018-10-13 NOTE — Telephone Encounter (Signed)
Instructions left on the patient's answering machine. Asked to call back with any questions. S.Trenia Tennyson EMTP 

## 2018-10-14 ENCOUNTER — Emergency Department (HOSPITAL_COMMUNITY): Payer: Medicare Other

## 2018-10-14 ENCOUNTER — Encounter (HOSPITAL_COMMUNITY)
Admission: EM | Disposition: A | Discharge status: Home / Self Care | Payer: Self-pay | Source: Home / Self Care | Attending: Emergency Medicine

## 2018-10-14 ENCOUNTER — Encounter (HOSPITAL_COMMUNITY): Payer: Self-pay | Admitting: *Deleted

## 2018-10-14 ENCOUNTER — Other Ambulatory Visit: Payer: Self-pay

## 2018-10-14 ENCOUNTER — Observation Stay (HOSPITAL_COMMUNITY)
Admission: EM | Admit: 2018-10-14 | Discharge: 2018-10-15 | Disposition: A | Discharge status: Home / Self Care | Payer: Medicare Other | Attending: Cardiovascular Disease | Admitting: Cardiovascular Disease

## 2018-10-14 DIAGNOSIS — Z79899 Other long term (current) drug therapy: Secondary | ICD-10-CM | POA: Insufficient documentation

## 2018-10-14 DIAGNOSIS — Z87891 Personal history of nicotine dependence: Secondary | ICD-10-CM | POA: Diagnosis not present

## 2018-10-14 DIAGNOSIS — R079 Chest pain, unspecified: Secondary | ICD-10-CM | POA: Diagnosis not present

## 2018-10-14 DIAGNOSIS — Z955 Presence of coronary angioplasty implant and graft: Secondary | ICD-10-CM | POA: Insufficient documentation

## 2018-10-14 DIAGNOSIS — Z8249 Family history of ischemic heart disease and other diseases of the circulatory system: Secondary | ICD-10-CM | POA: Insufficient documentation

## 2018-10-14 DIAGNOSIS — I129 Hypertensive chronic kidney disease with stage 1 through stage 4 chronic kidney disease, or unspecified chronic kidney disease: Secondary | ICD-10-CM | POA: Diagnosis not present

## 2018-10-14 DIAGNOSIS — N179 Acute kidney failure, unspecified: Secondary | ICD-10-CM | POA: Diagnosis not present

## 2018-10-14 DIAGNOSIS — H409 Unspecified glaucoma: Secondary | ICD-10-CM | POA: Insufficient documentation

## 2018-10-14 DIAGNOSIS — I25110 Atherosclerotic heart disease of native coronary artery with unstable angina pectoris: Principal | ICD-10-CM | POA: Insufficient documentation

## 2018-10-14 DIAGNOSIS — N400 Enlarged prostate without lower urinary tract symptoms: Secondary | ICD-10-CM | POA: Diagnosis not present

## 2018-10-14 DIAGNOSIS — E785 Hyperlipidemia, unspecified: Secondary | ICD-10-CM | POA: Insufficient documentation

## 2018-10-14 DIAGNOSIS — I200 Unstable angina: Secondary | ICD-10-CM

## 2018-10-14 DIAGNOSIS — Z888 Allergy status to other drugs, medicaments and biological substances status: Secondary | ICD-10-CM | POA: Diagnosis not present

## 2018-10-14 DIAGNOSIS — Z823 Family history of stroke: Secondary | ICD-10-CM | POA: Diagnosis not present

## 2018-10-14 DIAGNOSIS — N183 Chronic kidney disease, stage 3 (moderate): Secondary | ICD-10-CM | POA: Insufficient documentation

## 2018-10-14 DIAGNOSIS — K219 Gastro-esophageal reflux disease without esophagitis: Secondary | ICD-10-CM | POA: Diagnosis present

## 2018-10-14 DIAGNOSIS — I249 Acute ischemic heart disease, unspecified: Secondary | ICD-10-CM | POA: Diagnosis not present

## 2018-10-14 DIAGNOSIS — R0789 Other chest pain: Secondary | ICD-10-CM | POA: Diagnosis present

## 2018-10-14 DIAGNOSIS — Z1159 Encounter for screening for other viral diseases: Secondary | ICD-10-CM | POA: Insufficient documentation

## 2018-10-14 DIAGNOSIS — G4730 Sleep apnea, unspecified: Secondary | ICD-10-CM | POA: Insufficient documentation

## 2018-10-14 DIAGNOSIS — Z7982 Long term (current) use of aspirin: Secondary | ICD-10-CM | POA: Diagnosis not present

## 2018-10-14 DIAGNOSIS — I2510 Atherosclerotic heart disease of native coronary artery without angina pectoris: Secondary | ICD-10-CM

## 2018-10-14 HISTORY — PX: LEFT HEART CATH AND CORONARY ANGIOGRAPHY: CATH118249

## 2018-10-14 LAB — CBC WITH DIFFERENTIAL/PLATELET
Abs Immature Granulocytes: 0.02 10*3/uL (ref 0.00–0.07)
Basophils Absolute: 0.1 10*3/uL (ref 0.0–0.1)
Basophils Relative: 1 %
Eosinophils Absolute: 0.3 10*3/uL (ref 0.0–0.5)
Eosinophils Relative: 4 %
HCT: 41.5 % (ref 39.0–52.0)
Hemoglobin: 13.9 g/dL (ref 13.0–17.0)
Immature Granulocytes: 0 %
Lymphocytes Relative: 22 %
Lymphs Abs: 1.5 10*3/uL (ref 0.7–4.0)
MCH: 30.7 pg (ref 26.0–34.0)
MCHC: 33.5 g/dL (ref 30.0–36.0)
MCV: 91.6 fL (ref 80.0–100.0)
Monocytes Absolute: 0.7 10*3/uL (ref 0.1–1.0)
Monocytes Relative: 11 %
Neutro Abs: 4.1 10*3/uL (ref 1.7–7.7)
Neutrophils Relative %: 62 %
Platelets: 280 10*3/uL (ref 150–400)
RBC: 4.53 MIL/uL (ref 4.22–5.81)
RDW: 13.3 % (ref 11.5–15.5)
WBC: 6.6 10*3/uL (ref 4.0–10.5)
nRBC: 0 % (ref 0.0–0.2)

## 2018-10-14 LAB — PROTIME-INR
INR: 1 (ref 0.8–1.2)
Prothrombin Time: 13 seconds (ref 11.4–15.2)

## 2018-10-14 LAB — BASIC METABOLIC PANEL
Anion gap: 12 (ref 5–15)
BUN: 20 mg/dL (ref 8–23)
CO2: 23 mmol/L (ref 22–32)
Calcium: 9.6 mg/dL (ref 8.9–10.3)
Chloride: 103 mmol/L (ref 98–111)
Creatinine, Ser: 1.29 mg/dL — ABNORMAL HIGH (ref 0.61–1.24)
GFR calc Af Amer: >60 mL/min (ref >60)
GFR calc non Af Amer: 53 mL/min — ABNORMAL LOW (ref >60)
Glucose, Bld: 106 mg/dL — ABNORMAL HIGH (ref 70–99)
Potassium: 4 mmol/L (ref 3.5–5.1)
Sodium: 138 mmol/L (ref 135–145)

## 2018-10-14 LAB — TROPONIN I: Troponin I: <0.03 ng/mL (ref <0.03)

## 2018-10-14 LAB — SARS CORONAVIRUS 2: SARS Coronavirus 2: NOT DETECTED

## 2018-10-14 SURGERY — LEFT HEART CATH AND CORONARY ANGIOGRAPHY
Anesthesia: LOCAL

## 2018-10-14 MED ORDER — PANTOPRAZOLE SODIUM 40 MG PO TBEC
40.0000 mg | DELAYED_RELEASE_TABLET | Freq: Every day | ORAL | Status: DC
  Administered 2018-10-15: 40 mg via ORAL
  Filled 2018-10-14: qty 1

## 2018-10-14 MED ORDER — ACETAMINOPHEN 325 MG PO TABS: 650.0000 mg | ORAL_TABLET | ORAL | Status: DC | PRN

## 2018-10-14 MED ORDER — IOHEXOL 350 MG/ML SOLN
INTRAVENOUS | Status: DC | PRN
  Administered 2018-10-14: 15:00:00 70 mL

## 2018-10-14 MED ORDER — SODIUM CHLORIDE 0.9 % IV SOLN: 250.0000 mL | INTRAVENOUS | Status: DC | PRN

## 2018-10-14 MED ORDER — ROSUVASTATIN CALCIUM 5 MG PO TABS
10.0000 mg | ORAL_TABLET | Freq: Every day | ORAL | Status: DC
  Administered 2018-10-14: 10 mg via ORAL
  Filled 2018-10-14: qty 2

## 2018-10-14 MED ORDER — VERAPAMIL HCL 2.5 MG/ML IV SOLN
INTRAVENOUS | Status: AC
  Filled 2018-10-14: qty 2

## 2018-10-14 MED ORDER — OMEGA-3 FATTY ACIDS 1000 MG PO CAPS: 1.0000 g | ORAL_CAPSULE | Freq: Every day | ORAL | Status: DC

## 2018-10-14 MED ORDER — ASPIRIN 81 MG PO CHEW
324.0000 mg | CHEWABLE_TABLET | Freq: Once | ORAL | Status: AC
  Administered 2018-10-14: 324 mg via ORAL
  Filled 2018-10-14: qty 4

## 2018-10-14 MED ORDER — ONDANSETRON HCL 4 MG/2ML IJ SOLN: 4.0000 mg | Freq: Four times a day (QID) | INTRAMUSCULAR | Status: DC | PRN

## 2018-10-14 MED ORDER — LIDOCAINE HCL (PF) 1 % IJ SOLN
INTRAMUSCULAR | Status: AC
  Filled 2018-10-14: qty 30

## 2018-10-14 MED ORDER — VERAPAMIL HCL 2.5 MG/ML IV SOLN
INTRAVENOUS | Status: DC | PRN
  Administered 2018-10-14: 10 mL via INTRA_ARTERIAL

## 2018-10-14 MED ORDER — SODIUM CHLORIDE 0.9 % WEIGHT BASED INFUSION: 1.0000 mL/kg/h | INTRAVENOUS | Status: DC

## 2018-10-14 MED ORDER — FENTANYL CITRATE (PF) 100 MCG/2ML IJ SOLN
INTRAMUSCULAR | Status: DC | PRN
  Administered 2018-10-14: 25 ug via INTRAVENOUS

## 2018-10-14 MED ORDER — SODIUM CHLORIDE 0.9 % WEIGHT BASED INFUSION
1.0000 mL/kg/h | INTRAVENOUS | Status: AC
  Administered 2018-10-14: 1 mL/kg/h via INTRAVENOUS

## 2018-10-14 MED ORDER — MIDAZOLAM HCL 2 MG/2ML IJ SOLN
INTRAMUSCULAR | Status: AC
  Filled 2018-10-14: qty 2

## 2018-10-14 MED ORDER — MIDAZOLAM HCL 2 MG/2ML IJ SOLN
INTRAMUSCULAR | Status: DC | PRN
  Administered 2018-10-14: 1 mg via INTRAVENOUS

## 2018-10-14 MED ORDER — HEPARIN (PORCINE) 25000 UT/250ML-% IV SOLN
1150.0000 [IU]/h | INTRAVENOUS | Status: DC
  Administered 2018-10-14: 1150 [IU]/h via INTRAVENOUS
  Filled 2018-10-14: qty 250

## 2018-10-14 MED ORDER — FENTANYL CITRATE (PF) 100 MCG/2ML IJ SOLN
INTRAMUSCULAR | Status: AC
  Filled 2018-10-14: qty 2

## 2018-10-14 MED ORDER — SODIUM CHLORIDE 0.9 % WEIGHT BASED INFUSION: 3.0000 mL/kg/h | INTRAVENOUS | Status: AC

## 2018-10-14 MED ORDER — HEPARIN SODIUM (PORCINE) 5000 UNIT/ML IJ SOLN: 5000.0000 [IU] | Freq: Three times a day (TID) | INTRAMUSCULAR | Status: DC

## 2018-10-14 MED ORDER — SODIUM CHLORIDE 0.9% FLUSH: 3.0000 mL | INTRAVENOUS | Status: DC | PRN

## 2018-10-14 MED ORDER — SODIUM CHLORIDE 0.9% FLUSH
3.0000 mL | Freq: Two times a day (BID) | INTRAVENOUS | Status: DC
  Administered 2018-10-15: 3 mL via INTRAVENOUS

## 2018-10-14 MED ORDER — HEPARIN SODIUM (PORCINE) 5000 UNIT/ML IJ SOLN
5000.0000 [IU] | Freq: Three times a day (TID) | INTRAMUSCULAR | Status: DC
  Administered 2018-10-15: 5000 [IU] via SUBCUTANEOUS

## 2018-10-14 MED ORDER — NITROGLYCERIN 0.4 MG SL SUBL: 0.4000 mg | SUBLINGUAL_TABLET | SUBLINGUAL | Status: DC | PRN

## 2018-10-14 MED ORDER — ASPIRIN EC 81 MG PO TBEC
81.0000 mg | DELAYED_RELEASE_TABLET | Freq: Every day | ORAL | Status: DC
  Administered 2018-10-15: 81 mg via ORAL
  Filled 2018-10-14: qty 1

## 2018-10-14 MED ORDER — AMLODIPINE BESYLATE 5 MG PO TABS
5.0000 mg | ORAL_TABLET | Freq: Every day | ORAL | Status: DC
  Administered 2018-10-15: 5 mg via ORAL
  Filled 2018-10-14: qty 1

## 2018-10-14 MED ORDER — LIDOCAINE HCL (PF) 1 % IJ SOLN
INTRAMUSCULAR | Status: DC | PRN
  Administered 2018-10-14: 2 mL

## 2018-10-14 MED ORDER — HEPARIN (PORCINE) IN NACL 1000-0.9 UT/500ML-% IV SOLN
INTRAVENOUS | Status: DC | PRN
  Administered 2018-10-14 (×2): 500 mL

## 2018-10-14 MED ORDER — HEPARIN (PORCINE) IN NACL 1000-0.9 UT/500ML-% IV SOLN
INTRAVENOUS | Status: AC
  Filled 2018-10-14: qty 1000

## 2018-10-14 MED ORDER — HEPARIN BOLUS VIA INFUSION
4000.0000 [IU] | Freq: Once | INTRAVENOUS | Status: AC
  Administered 2018-10-14: 4000 [IU] via INTRAVENOUS
  Filled 2018-10-14: qty 4000

## 2018-10-14 MED ORDER — CO Q-10 100 MG PO CAPS: 2.0000 | ORAL_CAPSULE | Freq: Every day | ORAL | Status: DC

## 2018-10-14 MED ORDER — HEPARIN SODIUM (PORCINE) 1000 UNIT/ML IJ SOLN
INTRAMUSCULAR | Status: DC | PRN
  Administered 2018-10-14: 4500 [IU] via INTRAVENOUS

## 2018-10-14 MED ORDER — SODIUM CHLORIDE 0.9% FLUSH: 3.0000 mL | Freq: Two times a day (BID) | INTRAVENOUS | Status: DC

## 2018-10-14 SURGICAL SUPPLY — 11 items
CATH 5FR JL3.5 JR4 ANG PIG MP (CATHETERS) ×2 IMPLANT
CATH INFINITI 5 FR 3DRC (CATHETERS) ×2 IMPLANT
CATH INFINITI 5FR AL1 (CATHETERS) ×2 IMPLANT
DEVICE RAD COMP TR BAND LRG (VASCULAR PRODUCTS) ×2 IMPLANT
GLIDESHEATH SLEND SS 6F .021 (SHEATH) ×2 IMPLANT
GUIDEWIRE INQWIRE 1.5J.035X260 (WIRE) ×1 IMPLANT
INQWIRE 1.5J .035X260CM (WIRE) ×2
KIT HEART LEFT (KITS) ×2 IMPLANT
PACK CARDIAC CATHETERIZATION (CUSTOM PROCEDURE TRAY) ×2 IMPLANT
TRANSDUCER W/STOPCOCK (MISCELLANEOUS) ×2 IMPLANT
TUBING CIL FLEX 10 FLL-RA (TUBING) ×2 IMPLANT

## 2018-10-14 NOTE — Interval H&P Note (Signed)
History and Physical Interval Note:  10/14/2018 2:44 PM  Patrick [name]  has presented today for surgery, with the diagnosis of unstable angina.  The various methods of treatment have been discussed with the patient and family. After consideration of risks, benefits and other options for treatment, the patient has consented to  Procedure(s): LEFT HEART CATH AND CORONARY ANGIOGRAPHY (N/A) as a surgical intervention.  The patient's history has been reviewed, patient examined, no change in status, stable for surgery.  I have reviewed the patient's chart and labs.  Questions were answered to the patient's satisfaction.   Cath Lab Visit (complete for each Cath Lab visit)  Clinical Evaluation Leading to the Procedure:   ACS: Yes.    Non-ACS:    Anginal Classification: CCS III  Anti-ischemic medical therapy: Maximal Therapy (2 or more classes of medications)  Non-Invasive Test Results: No non-invasive testing performed  Prior CABG: No previous CABG        Luana Shu, [facility] 10/14/2018 2:45 PM

## 2018-10-14 NOTE — ED Notes (Signed)
Patient reports taking 81 mg aspirin this am.

## 2018-10-14 NOTE — ED Notes (Signed)
This RN acting as Art therapist and asked pt if he would like for me to contact any family/friends. Pt declined and states he is able to keep them informed.

## 2018-10-14 NOTE — ED Provider Notes (Signed)
[facility] EMERGENCY DEPARTMENT Provider Note   CSN: [id number] Arrival date & time: 10/14/18  1002    History   Chief Complaint Chief Complaint  Patient presents with  . Chest Pain    HPI Patrick [name] is a 78 y.o. male who  has a past medical history of Allergy, Aortic root dilatation (HCC), BPH (benign prostatic hypertrophy), Bradycardia, CAD (coronary artery disease), Colon polyps, Complication of anesthesia, Diverticulitis, GERD (gastroesophageal reflux disease), Glaucoma, Hyperlipidemia, Hypertension, Impaired fasting glucose, Sleep apnea, and Syncope.  He is complaining of chest tightness for the past few days.  He has a history of previous CAD with stent placement.  He is scheduled for a an outpatient stress test tomorrow at 10 AM.  Patient states that he has had chest pressure worse with exertionand tightness on and off which is for the past 6 days.  Yesterday the patient was started on Imdur after he spoke with his cardiologist.  Patient states that he was able to [place] 3 large yards and weed eat and was doing well except after noon he began having severe chest tightness which was worse with exertion and has been fairly unrelenting since that time.  This morning the patient took his M door and a short acting nitroglycerin around 5 AM with some easing of his discomfort however it has never fully gone away and is currently active at this time.  Patient also states that around 3:00 this morning he awoke in a cold sweat.  He denies nausea vomiting or shortness of breath.  His symptoms feel similar to what he had when he had his stents placed.  He denies fevers chills shortness of breath or cough.      HPI  Past Medical History:  Diagnosis Date  . Allergy   . Aortic root dilatation (HCC)    Mild by echo 11/2011 (60mm)  . BPH (benign prostatic hypertrophy)   . Bradycardia    Precluding BB use.  [place] Kitchen CAD (coronary artery disease)    a. s/p Taxus DES to RCA and PDA in  2006 .  // b. Myoview 8/11: Walked 12:37. EF 57%. no ischemia or scar.  // c. Mod CAD by cath 11/2011 (40-50% ISR of PDA -- for OP myoview to assess).  //  d. Myoview 3/18: EF 58, no ischemia; Low Risk  . Colon polyps   . Complication of anesthesia    "they said they about lost, me when I had eye surgery"  . Diverticulitis   . GERD (gastroesophageal reflux disease)   . Glaucoma   . Hyperlipidemia   . Hypertension   . Impaired fasting glucose   . Sleep apnea   . Syncope     Patient Active Problem List   Diagnosis Date Noted  . Chronic renal disease, stage III ([place]) 07/01/2018  . Coronary artery disease involving native coronary artery of native heart without angina pectoris 04/15/2016  . Advance directive discussed with patient 08/08/2014  . Dilated aortic root ([place]) 12/11/2011  . Routine general medical examination at a health care facility 12/28/2010  . SEBORRHEA 04/25/2008  . IMPAIRED FASTING GLUCOSE 07/31/2007  . Hyperlipidemia 01/09/2007  . Essential hypertension 01/09/2007  . ALLERGIC RHINITIS 01/09/2007  . GERD 01/09/2007  . DIVERTICULOSIS, COLON 01/09/2007  . BPH without obstruction/lower urinary tract symptoms 01/09/2007  . 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 COLONIC POLYPS, HX OF 01/09/2007    Past Surgical History:  Procedure Laterality Date  . ANGIOPLASTY  percutaneous transluminal coronary  angioplasty and stenting of the right coronary artery and posterior   . CATARACT EXTRACTION W/ INTRAOCULAR LENS  IMPLANT, BILATERAL  9/15  . COLONOSCOPY    . INTRAOCULAR LENS INSERTION    . LEFT HEART CATHETERIZATION WITH CORONARY ANGIOGRAM N/A 11/26/2011   Procedure: LEFT HEART CATHETERIZATION WITH CORONARY ANGIOGRAM;  Surgeon: Thayer Headings, MD;  Location: [facility] CATH LAB;  Service: Cardiovascular;  Laterality: N/A;  . LUMBAR LAMINECTOMY/DECOMPRESSION MICRODISCECTOMY Left 05/27/2016   Procedure: Laminectomy for facet/synovial cyst - Lumbar four - Lumbar five - left;  Surgeon:  Kary Kos, MD;  Location: [facility];  Service: Neurosurgery;  Laterality: Left;  Laminectomy for facet/synovial cyst - Lumbar four - Lumbar five - left  . PILONIDAL CYST / SINUS EXCISION          Home Medications    Prior to Admission medications   Medication Sig Start Date End Date Taking? Authorizing Provider  amLODipine (NORVASC) 5 MG tablet TAKE 1 TABLET BY MOUTH DAILY 09/22/18   Viviana Simpler I, MD  aspirin 81 MG tablet Take 81 mg by mouth daily.      [provider]  Coenzyme Q10 (CO Q-10) 100 MG CAPS Take 2 capsules by mouth daily.     [provider]  fish oil-omega-3 fatty acids 1000 MG capsule Take 1 g by mouth daily.     [provider]  isosorbide mononitrate (IMDUR) 30 MG 24 hr tablet Take 1 tablet (30 mg total) by mouth daily. 10/12/18   Nahser, Wonda Cheng, MD  ketoconazole (NIZORAL) 2 % shampoo Apply 1 application topically 2 (two) times a week. 07/02/18   Venia Carbon, MD  latanoprost (XALATAN) 0.005 % ophthalmic solution Place 1 drop into both eyes at bedtime.  01/24/14   [provider]  nitroGLYCERIN (NITROSTAT) 0.4 MG SL tablet Place 1 tablet (0.4 mg total) under the tongue every 5 (five) minutes x 3 doses as needed for chest pain. 10/12/18 01/12/20  Nahser, Wonda Cheng, MD  olmesartan-hydrochlorothiazide (BENICAR HCT) 40-25 MG tablet Take 1 tablet by mouth daily. 01/26/18   Nahser, Wonda Cheng, MD  omeprazole (PRILOSEC) 40 MG capsule Take 1 capsule by mouth daily. 05/20/17   [provider]  rosuvastatin (CRESTOR) 20 MG tablet TAKE ONE-HALF TABLET BY MOUTH ONCE DAILY 06/12/18   Nahser, Wonda Cheng, MD  triamcinolone cream (KENALOG) 0.1 % 1 APPLICATION APPLY ON THE SKIN AS DIRECTED APPLY TO AFFECTED AREAS AS NEEDED AS DIRECTED 11/06/17   [provider]    Family History Family History  Problem Relation Age of Onset  . Coronary artery disease Father   . Heart disease Father   . Heart failure Father   . Cancer Mother        ovarian  cancer  . Stroke Son   . Hypertension Neg Hx   . Diabetes Neg Hx     Social History Social History   Tobacco Use  . Smoking status: Former Smoker    Years: 30.00    Last attempt to quit: 05/13/1978    Years since quitting: 40.4  . Smokeless tobacco: Never Used  Substance Use Topics  . Alcohol use: Yes    Alcohol/week: 14.0 standard drinks    Types: 14 Glasses of wine per week  . Drug use: No     Allergies   Atorvastatin; Ezetimibe-simvastatin; Lisinopril; and Pravastatin   Review of Systems Review of Systems  Ten systems reviewed and are negative for acute change, except  as noted in the HPI.   Physical Exam Updated Vital Signs Ht 5\' 7"  (1.702 m)   Wt 84.8 kg   BMI 29.29 kg/m   Physical Exam Vitals signs and nursing note reviewed.  Constitutional:      General: He is not in acute distress.    Appearance: He is well-developed. He is not diaphoretic.  HENT:     Head: Normocephalic and atraumatic.  Eyes:     General: No scleral icterus.    Conjunctiva/sclera: Conjunctivae normal.  Neck:     Musculoskeletal: Normal range of motion and neck supple.  Cardiovascular:     Rate and Rhythm: Normal rate and regular rhythm.     Heart sounds: Normal heart sounds.  Pulmonary:     Effort: Pulmonary effort is normal. No respiratory distress.     Breath sounds: Normal breath sounds.  Abdominal:     Palpations: Abdomen is soft.     Tenderness: There is no abdominal tenderness.  Skin:    General: Skin is warm and dry.  Neurological:     Mental Status: He is alert.  Psychiatric:        Behavior: Behavior normal.      ED Treatments / Results  Labs (all labs ordered are listed, but only abnormal results are displayed) Labs Reviewed  SARS CORONAVIRUS 2 (HOSPITAL ORDER, [facility])  CBC WITH DIFFERENTIAL/PLATELET  BASIC METABOLIC PANEL  TROPONIN I    EKG EKG Interpretation  Date/Time:  Wednesday October 14 2018 10:26:21 EDT Ventricular  Rate:  51 PR Interval:    QRS Duration: 106 QT Interval:  443 QTC Calculation: 408 R Axis:     Text Interpretation:  Sinus rhythm Atrial premature complex Consider left atrial enlargement Left ventricular hypertrophy Borderline T abnormalities, lateral leads No significant change since last tracing Confirmed by Deno Etienne [phone]) on 10/14/2018 11:57:03 AM   Radiology No results found.  Procedures .Critical Care Performed by: Margarita Mail, PA-C Authorized by: Margarita Mail, PA-C   Critical care provider statement:    Critical care time (minutes):  30   Critical care was necessary to treat or prevent imminent or life-threatening deterioration of the following conditions:  Cardiac failure   Critical care was time spent personally by me on the following activities:  Discussions with consultants, evaluation of patient's response to treatment, examination of patient, ordering and performing treatments and interventions, ordering and review of laboratory studies, ordering and review of radiographic studies, pulse oximetry, re-evaluation of patient's condition, obtaining history from patient or surrogate and review of old charts   (including critical care time)  Medications Ordered in ED Medications - No data to display   Initial Impression / Assessment and Plan / ED Course  I have reviewed the triage vital signs and the nursing notes.  Pertinent labs & imaging results that were available during my care of the patient were reviewed by me and considered in my medical decision making (see chart for details).        UE:AVWUJ pressure VS: BP 111/68   Pulse (!) 58   Temp 97.8 F (36.6 C) (Oral)   Resp 15   Ht 5\' 7"  (1.702 m)   Wt 84.8 kg   SpO2 96%   BMI 29.29 kg/m  WJ:XBJYNWG is gathered by patient and emr. DDX: The emergent differential diagnosis of chest pain includes: Acute coronary syndrome, pericarditis, aortic dissection, pulmonary embolism, tension pneumothorax,  pneumonia, and esophageal rupture. Labs: I reviewed the labs  which shows slightly elevated blood glucose, creatinine is slightly improved from the previous.  Normal PT/INR.  Troponin negative.  CBC without abnormality. Imaging: I personally reviewed the images (portable chest x-ray) which show(s) no acute abnormalities EKG:   EKG Interpretation  Date/Time:  Wednesday October 14 2018 10:26:21 EDT Ventricular Rate:  51 PR Interval:    QRS Duration: 106 QT Interval:  443 QTC Calculation: 408 R Axis:     Text Interpretation:  Sinus rhythm Atrial premature complex Consider left atrial enlargement Left ventricular hypertrophy Borderline T abnormalities, lateral leads No significant change since last tracing Confirmed by Deno Etienne [phone]) on 10/14/2018 11:57:03 AM      MDM: Patient with persistent worsening exertional chest pressure, history of known cardiac disease with stents in place, scheduled for outpatient stress test tomorrow however patient will be admitted for cardiac catheterization by the audiology service.  EKG shows no significant changes from previous tracing.  hIs troponin is negative. Patient disposition: admit Patient condition: stable. The patient appears reasonably stabilized for admission considering the current resources, flow, and capabilities available in the ED at this time, and I doubt any other [facility] requiring further screening and/or treatment in the ED prior to admission.   Final Clinical Impressions(s) / ED Diagnoses   Final diagnoses:  ACS (acute coronary syndrome) [facility])    ED Discharge Orders    None       Margarita Mail, PA-C 10/14/18 [place], DO 10/14/18 1225

## 2018-10-14 NOTE — H&P (Addendum)
Cardiology Admission History and Physical:   Patient ID: Patrick [name] MRN: [id number]; DOB: [birth year]   Admission date: 10/14/2018  Primary Care Provider: Venia Carbon, MD Primary Cardiologist: Mertie Moores, MD  Chief Complaint:  Chest pain   Patient Profile:   Patrick [name] is a 78 y.o. male with with hx of CAD, HTN, HLD and sleep apnea presented with chest pain.   Hx of CAD s/p DES to RCA and PDA in 2006. 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 A subsequent Myoview study was normal.  Last stress test 07/2016 was low risk.   He was doing well when last seen by Dr. [name] 01/2018.  History of Present Illness:   Patrick [name] has recently noted exertional chest tightness with shortness of breath. His symptoms (chest tightness and dyspnea) started after heavy exertional activity last Thursday 5/30. This reminded him of similar episode while hiking in trails [place] in early March.  Since then he had waxing and weaning episodes. Called office 2 day and started on Imdur and plan for stress test. However presented he has constant substernal chest pressure since last night. Eventually he took SL nitro with improvement but pain returned and came to ER. He never had any chest discomfort with prior angina. Imdur gave him headache but continued to take it.   In Er, he was given ASA 324mg  and started on IV heparin with resolution of pain. Scr of 1.29. Troponin negative. COVID negative. CXR without acute findings. EKG showed sinus bradycardia at rate of 51 bpm and non specific T wave abnormality - personally reviewed.   The patient denies nausea, vomiting, fever, palpitations, orthopnea, PND, dizziness, syncope, cough, congestion, abdominal pain, hematochezia, melena, lower extremity edema.  Past Medical History:  Diagnosis Date  . Allergy   . Aortic root dilatation (HCC)    Mild by echo 11/2011 (51mm)  . BPH (benign prostatic hypertrophy)   .  Bradycardia    Precluding BB use.  Marland Kitchen CAD (coronary artery disease)    a. s/p Taxus DES to RCA and PDA in 2006 .  // b. Myoview 8/11: Walked 12:37. EF 57%. no ischemia or scar.  // c. Mod CAD by cath 11/2011 (40-50% ISR of PDA -- for OP myoview to assess).  //  d. Myoview 3/18: EF 58, no ischemia; Low Risk  . Colon polyps   . Complication of anesthesia    "they said they about lost, me when I had eye surgery"  . Diverticulitis   . GERD (gastroesophageal reflux disease)   . Glaucoma   . Hyperlipidemia   . Hypertension   . Impaired fasting glucose   . Sleep apnea   . Syncope     Past Surgical History:  Procedure Laterality Date  . ANGIOPLASTY     percutaneous transluminal coronary  angioplasty and stenting of the right coronary artery and posterior   . CATARACT EXTRACTION W/ INTRAOCULAR LENS  IMPLANT, BILATERAL  9/15  . COLONOSCOPY    . INTRAOCULAR LENS INSERTION    . LEFT HEART CATHETERIZATION WITH CORONARY ANGIOGRAM N/A 11/26/2011   Procedure: LEFT HEART CATHETERIZATION WITH CORONARY ANGIOGRAM;  Surgeon: Thayer Headings, MD;  Location: [facility] CATH LAB;  Service: Cardiovascular;  Laterality: N/A;  . LUMBAR LAMINECTOMY/DECOMPRESSION MICRODISCECTOMY Left 05/27/2016   Procedure: Laminectomy for facet/synovial cyst - Lumbar four - Lumbar five - left;  Surgeon: Kary Kos, MD;  Location: [place];  Service: Neurosurgery;  Laterality: Left;  Laminectomy for facet/synovial  cyst - Lumbar four - Lumbar five - left  . PILONIDAL CYST / SINUS EXCISION       Medications Prior to Admission: Prior to Admission medications   Medication Sig Start Date End Date Taking? Authorizing Provider  amLODipine (NORVASC) 5 MG tablet TAKE 1 TABLET BY MOUTH DAILY Patient taking differently: Take 5 mg by mouth daily.  09/22/18  Yes Venia Carbon, MD  aspirin 81 MG tablet Take 81 mg by mouth daily.     Yes [provider]  Coenzyme Q10 (CO Q-10) 100 MG CAPS Take 2 capsules by mouth daily.    Yes [provider]  fish oil-omega-3 fatty acids 1000 MG capsule Take 1 g by mouth daily.    Yes [provider]  isosorbide mononitrate (IMDUR) 30 MG 24 hr tablet Take 1 tablet (30 mg total) by mouth daily. 10/12/18  Yes Nahser, Wonda Cheng, MD  ketoconazole (NIZORAL) 2 % shampoo Apply 1 application topically 2 (two) times a week. 07/02/18  Yes Venia Carbon, MD  latanoprost (XALATAN) 0.005 % ophthalmic solution Place 1 drop into both eyes at bedtime.  01/24/14  Yes [provider]  nitroGLYCERIN (NITROSTAT) 0.4 MG SL tablet Place 1 tablet (0.4 mg total) under the tongue every 5 (five) minutes x 3 doses as needed for chest pain. 10/12/18 01/12/20 Yes Nahser, Wonda Cheng, MD  olmesartan-hydrochlorothiazide (BENICAR HCT) 40-25 MG tablet Take 1 tablet by mouth daily. 01/26/18  Yes Nahser, Wonda Cheng, MD  omeprazole (PRILOSEC) 40 MG capsule Take 1 capsule by mouth daily. 05/20/17  Yes [provider]  rosuvastatin (CRESTOR) 20 MG tablet TAKE ONE-HALF TABLET BY MOUTH ONCE DAILY Patient taking differently: Take 10 mg by mouth daily.  06/12/18  Yes Nahser, Wonda Cheng, MD  triamcinolone cream (KENALOG) 0.1 % Apply 1 application topically as directed.  11/06/17  Yes [provider]     Allergies:    Allergies  Allergen Reactions  . Atorvastatin Other (See Comments)    MYALGIAS, LEG CRAMPING  . Ezetimibe-Simvastatin Other (See Comments)    MYALGIAS, CRAMPING  . Lisinopril Cough  . Pravastatin Other (See Comments)    Myalgias at 40mg  dosage    Social History:   Social History   Socioeconomic History  . Marital status: Divorced    Spouse name: Not on file  . Number of children: 3  . Years of education: Not on file  . Highest education level: Not on file  Occupational History  . 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 Financial resource strain: Not on file  . Food insecurity:    Worry: Not on file    Inability: Not on file  . Transportation needs:     Medical: Not on file    Non-medical: Not on file  Tobacco Use  . Smoking status: Former Smoker    Years: 30.00    Last attempt to quit: 05/13/1978    Years since quitting: 40.4  . Smokeless tobacco: Never Used  Substance and Sexual Activity  . Alcohol use: Yes    Alcohol/week: 14.0 standard drinks    Types: 14 Glasses of wine per week  . Drug use: No  . Sexual activity: Yes  Lifestyle  . Physical activity:    Days per week: Not on file    Minutes per session: Not on file  . Stress: Not on file  Relationships  . Social connections:    Talks on phone: Not  on file    Gets together: Not on file    Attends religious service: Not on file    Active member of club or organization: Not on file    Attends meetings of clubs or organizations: Not on file    Relationship status: Not on file  . Intimate partner violence:    Fear of current or ex partner: Not on file    Emotionally abused: Not on file    Physically abused: Not on file    Forced sexual activity: Not on file  Other Topics Concern  . Not on file  Social History Narrative   Has living will   Daughter Benjamine Mola should make medical decisions if he can't   Would accept resuscitation   Wouldn't want prolonged tube feeds if cognitively unaware    Family History:  The patient's family history includes Cancer in his mother; Coronary artery disease in his father; Heart disease in his father; Heart failure in his father; Stroke in his son. There is no history of Hypertension or Diabetes.    ROS:  Please see the history of present illness.  All other ROS reviewed and negative.     Physical Exam/Data:   Vitals:   10/14/18 1130 10/14/18 1145 10/14/18 1200 10/14/18 1215  BP: 111/68 102/61 105/65 (!) 102/59  Pulse:  (!) 50 (!) 51 (!) 50  Resp:  18 17 16   Temp:      TempSrc:      SpO2: 96% 93% 92% 93%  Weight:      Height:       No intake or output data in the 24 hours ending 10/14/18 1309 Last 3 Weights 10/14/2018  07/01/2018 01/26/2018  Weight (lbs) 187 lb 192 lb 190 lb  Weight (kg) 84.823 kg 87.091 kg 86.183 kg     Body mass index is 29.29 kg/m.  General:  Well nourished, well developed, in no acute distress HEENT: normal Lymph: no adenopathy Neck: no JVD Endocrine:  No thryomegaly Vascular: No carotid bruits; FA pulses 2+ bilaterally without bruits  Cardiac:  normal S1, S2; RRR; no murmur Lungs:  clear to auscultation bilaterally, no wheezing, rhonchi or rales  Abd: soft, nontender, no hepatomegaly  Ext: no edema Musculoskeletal:  No deformities, BUE and BLE strength normal and equal Skin: warm and dry  Neuro:  CNs 2-12 intact, no focal abnormalities noted Psych:  Normal affect      Relevant CV Studies:  Stress test 07/2016  Nuclear stress EF: 58%. No wall motion abnormalities.  There was no ST segment deviation noted during stress.  No ischemia, no perfusion defects  This is a low risk study.   Candee Furbish, MD   Laboratory Data:  Chemistry Recent Labs  Lab 10/14/18 1022  NA 138  K 4.0  CL 103  CO2 23  GLUCOSE 106*  BUN 20  CREATININE 1.29*  CALCIUM 9.6  GFRNONAA 53*  GFRAA >60  ANIONGAP 12    Hematology Recent Labs  Lab 10/14/18 1022  WBC 6.6  RBC 4.53  HGB 13.9  HCT 41.5  MCV 91.6  MCH 30.7  MCHC 33.5  RDW 13.3  PLT 280   Cardiac Enzymes Recent Labs  Lab 10/14/18 1022  TROPONINI <0.03   No results for input(s): TROPIPOC in the last 168 hours.   Radiology/Studies:  Dg Chest Port 1 View  Result Date: 10/14/2018 CLINICAL DATA:  Chest pain EXAM: PORTABLE CHEST 1 VIEW COMPARISON:  05/20/2016 FINDINGS: Normal heart size and mediastinal contours.  Mild scarring at the left base. No acute infiltrate or edema. No effusion or pneumothorax. No acute osseous findings. IMPRESSION: Stable chest. Electronically Signed   By: Monte Fantasia M.D.   On: 10/14/2018 10:42    Assessment and Plan:   1. Unstable angina - Symptoms started last week after heavy  exertional activity. Similar when he was hiking [place] in [place]. Now chest tightness and dyspnea present at rest. Improved with SL nitro but returned. Now symptoms free since on IV heparin and ASA 324mg  in ER. Troponin negative. EKG with non specific T wave changes.  - Likely cath. The patient understands that risks include but are not limited to stroke (1 in 1000), death (1 in 85), kidney failure [usually temporary] (1 in 500), bleeding (1 in 200), allergic reaction [possibly serious] (1 in 200), and agrees to proceed.  - Final plan per MD.  2. HLD - Hx of statin intolerance. Currently on Crestor 10mg  daily.  - 01/26/2018: Cholesterol, Total 136; HDL 49; LDL Calculated 67; Triglycerides 102  - recheck tomorrow and adjust   3. AKI - Hold home Benicar HCT. Will hydrate.   4. HTN - Continue Amlodipine 5mg  daily. Hold Benicar/HCT for AKi.  Severity of Illness: The appropriate patient status for this patient is OBSERVATION. Observation status is judged to be reasonable and necessary in order to provide the required intensity of service to ensure the patient's safety. The patient's presenting symptoms, physical exam findings, and initial radiographic and laboratory data in the context of their medical condition is felt to place them at decreased risk for further clinical deterioration. Furthermore, it is anticipated that the patient will be medically stable for discharge from the hospital within 2 midnights of admission. The following factors support the patient status of observation.   " The patient's presenting symptoms include Chest pain. " The physical exam findings include N/A " The initial radiographic and laboratory data are N/A     For questions or updates, please contact [place] Please consult [email or web address] for contact info under        Jarrett Soho, PA  10/14/2018 1:09 PM   I have personally seen and examined this patient. I agree with the assessment  and plan as outlined above.  Mr. Ruffolo is known to have CAD. He had a DES placed in the RCA/PDA in 2006. Last cath in 2013 with mild in stent restenosis. Recent chest pain and dyspnea with exertion.  No chest pain at rest currently.  He is on heparin in the ED.  Labs reviewed by me.  EKG reviewed by me. NSR with non-specific T wave abn My plan:  General: Well developed, well nourished, NAD  HEENT: OP clear, mucus membranes moist  SKIN: warm, dry. No rashes. Neuro: No focal deficits  Musculoskeletal: Muscle strength 5/5 all ext  Psychiatric: Mood and affect normal  Neck: No JVD, no carotid bruits, no thyromegaly, no lymphadenopathy.  Lungs:Clear bilaterally, no wheezes, rhonci, crackles Cardiovascular: Regular rate and rhythm. No murmurs, gallops or rubs. Abdomen:Soft. Bowel sounds present. Non-tender.  Extremities: No lower extremity edema. Pulses are 2 + in the bilateral DP/PT.  Plan: Unstable angina: Will plan cardiac cath today with possible PCI. Continue IV heparin.  I have reviewed the risks, indications, and alternatives to cardiac catheterization, possible angioplasty, and stenting with the patient. Risks include but are not limited to bleeding, infection, vascular injury, stroke, myocardial infection, arrhythmia, kidney injury, radiation-related injury in the case of prolonged fluoroscopy use, emergency  cardiac surgery, and death. The patient understands the risks of serious complication is 1-2 in 2300 with diagnostic cardiac cath and 1-2% or less with angioplasty/stenting.  Lauree Chandler 10/14/2018 2:21 PM

## 2018-10-14 NOTE — Progress Notes (Signed)
ANTICOAGULATION CONSULT NOTE - Initial Consult  Pharmacy Consult:  Heparin Indication: chest pain/ACS  Allergies  Allergen Reactions  . Atorvastatin Other (See Comments)    MYALGIAS, LEG CRAMPING  . Ezetimibe-Simvastatin Other (See Comments)    MYALGIAS, CRAMPING  . Lisinopril Cough  . Pravastatin Other (See Comments)    Myalgias at 40mg  dosage    Patient Measurements: Height: 5\' 7"  (170.2 cm) Weight: 187 lb (84.8 kg) IBW/kg (Calculated) : 66.1 Heparin Dosing Weight: 83 kg  Vital Signs: Temp: 97.8 F (36.6 C) (06/03 1023) Temp Source: Oral (06/03 1023) BP: 108/61 (06/03 1045) Pulse Rate: 53 (06/03 1045)  Labs: Recent Labs    10/14/18 1022  HGB 13.9  HCT 41.5  PLT 280    CrCl cannot be calculated (Patient's most recent lab result is older than the maximum 21 days allowed.).   Medical History: Past Medical History:  Diagnosis Date  . Allergy   . Aortic root dilatation (HCC)    Mild by echo 11/2011 (69mm)  . BPH (benign prostatic hypertrophy)   . Bradycardia    Precluding BB use.  Marland Kitchen CAD (coronary artery disease)    a. s/p Taxus DES to RCA and PDA in 2006 .  // b. Myoview 8/11: Walked 12:37. EF 57%. no ischemia or scar.  // c. Mod CAD by cath 11/2011 (40-50% ISR of PDA -- for OP myoview to assess).  //  d. Myoview 3/18: EF 58, no ischemia; Low Risk  . Colon polyps   . Complication of anesthesia    "they said they about lost, me when I had eye surgery"  . Diverticulitis   . GERD (gastroesophageal reflux disease)   . Glaucoma   . Hyperlipidemia   . Hypertension   . Impaired fasting glucose   . Sleep apnea   . Syncope     Assessment: 52 YOM presented with chest tightness and Pharmacy consulted to initiate IV heparin for ACS.  CBC WNL.  Goal of Therapy:  Heparin level 0.3-0.7 units/ml Monitor platelets by anticoagulation protocol: Yes   Plan:  Heparin 4000 units IV bolus, then Heparin gtt at 1150 units/hr Check 8 hr heparin level Daily heparin level  and CBC  Patrick [name], PharmD, BCPS, [place] 10/14/2018, 11:14 AM

## 2018-10-14 NOTE — ED Triage Notes (Signed)
To ED for eval of chest tightness for the past couple of days. States he took a nitro at 0500 with relief. Pt is to have stress test done tomorrow at 1000. Hx of stents with similar feeling. Night sweats during the night. Unable to sleep well

## 2018-10-15 ENCOUNTER — Encounter (HOSPITAL_COMMUNITY): Payer: Self-pay

## 2018-10-15 ENCOUNTER — Encounter (HOSPITAL_COMMUNITY): Payer: Medicare Other

## 2018-10-15 ENCOUNTER — Telehealth: Payer: Self-pay | Admitting: Nurse Practitioner

## 2018-10-15 DIAGNOSIS — I2510 Atherosclerotic heart disease of native coronary artery without angina pectoris: Secondary | ICD-10-CM | POA: Diagnosis not present

## 2018-10-15 DIAGNOSIS — E782 Mixed hyperlipidemia: Secondary | ICD-10-CM | POA: Diagnosis not present

## 2018-10-15 LAB — BASIC METABOLIC PANEL
Anion gap: 8 (ref 5–15)
BUN: 17 mg/dL (ref 8–23)
CO2: 25 mmol/L (ref 22–32)
Calcium: 9 mg/dL (ref 8.9–10.3)
Chloride: 104 mmol/L (ref 98–111)
Creatinine, Ser: 1.35 mg/dL — ABNORMAL HIGH (ref 0.61–1.24)
GFR calc Af Amer: 58 mL/min — ABNORMAL LOW (ref >60)
GFR calc non Af Amer: 50 mL/min — ABNORMAL LOW (ref >60)
Glucose, Bld: 107 mg/dL — ABNORMAL HIGH (ref 70–99)
Potassium: 4.2 mmol/L (ref 3.5–5.1)
Sodium: 137 mmol/L (ref 135–145)

## 2018-10-15 LAB — LIPID PANEL
Cholesterol: 120 mg/dL (ref 0–200)
HDL: 36 mg/dL — ABNORMAL LOW (ref >40)
LDL Cholesterol: 56 mg/dL (ref 0–99)
Total CHOL/HDL Ratio: 3.3 RATIO
Triglycerides: 139 mg/dL (ref <150)
VLDL: 28 mg/dL (ref 0–40)

## 2018-10-15 LAB — CBC
HCT: 38.3 % — ABNORMAL LOW (ref 39.0–52.0)
Hemoglobin: 12.9 g/dL — ABNORMAL LOW (ref 13.0–17.0)
MCH: 30.6 pg (ref 26.0–34.0)
MCHC: 33.7 g/dL (ref 30.0–36.0)
MCV: 90.8 fL (ref 80.0–100.0)
Platelets: 228 10*3/uL (ref 150–400)
RBC: 4.22 MIL/uL (ref 4.22–5.81)
RDW: 13.4 % (ref 11.5–15.5)
WBC: 6.2 10*3/uL (ref 4.0–10.5)
nRBC: 0 % (ref 0.0–0.2)

## 2018-10-15 MED ORDER — OLMESARTAN MEDOXOMIL-HCTZ 40-25 MG PO TABS: 1.0000 | ORAL_TABLET | Freq: Every day | ORAL | 3 refills | Status: DC

## 2018-10-15 NOTE — Plan of Care (Signed)
  Problem: Education: Goal: Knowledge of General Education information will improve Description Including pain rating scale, medication(s)/side effects and non-pharmacologic comfort measures Outcome: Progressing   Problem: Health Behavior/Discharge Planning: Goal: Ability to manage health-related needs will improve Outcome: Progressing   Problem: Clinical Measurements: Goal: Ability to maintain clinical measurements within normal limits will improve Outcome: Progressing Goal: Will remain free from infection Outcome: Progressing Goal: Diagnostic test results will improve Outcome: Progressing Goal: Cardiovascular complication will be avoided Outcome: Progressing   Problem: Clinical Measurements: Goal: Respiratory complications will improve Outcome: Completed/Met

## 2018-10-15 NOTE — Telephone Encounter (Signed)
Received notification from Dr. [name] that patient is hospitalized for chest pain and lexiscan myoview scheduled for today needs to be cancelled. Appointment and order cancelled in epic.

## 2018-10-15 NOTE — Discharge Summary (Addendum)
Discharge Summary    Patient ID: Boone Gear Nathanson MRN: [id number]; DOB: [birth year]  Admit date: 10/14/2018 Discharge date: 10/15/2018  Primary Care Provider: Venia Carbon, MD  Primary Cardiologist: Mertie Moores, MD  Primary Electrophysiologist:  None   Discharge Diagnoses    Active Problems:   Non-cardiac chest pain   CAD (coronary artery disease)- nonobstructive by cath 10/14/18    GERD   Allergies Allergies  Allergen Reactions  . Atorvastatin Other (See Comments)    MYALGIAS, LEG CRAMPING  . Ezetimibe-Simvastatin Other (See Comments)    MYALGIAS, CRAMPING  . Lisinopril Cough  . Pravastatin Other (See Comments)    Myalgias at 40mg  dosage    Diagnostic Studies/Procedures    LHC 10/14/2018  Procedures   LEFT HEART CATH AND CORONARY ANGIOGRAPHY  Conclusion     Mid LM lesion is 25% stenosed.  Dist LAD lesion is 50% stenosed.  Prox RCA lesion is 10% stenosed.  Mid RCA lesion is 35% stenosed.  RPDA-2 lesion is 30% stenosed.  RPDA-1 lesion is 50% stenosed.  LV end diastolic pressure is normal.  1. Nonobstructive CAD. Compared to prior angiogram in 2014 there is no significant change. 2. Normal LVEDP  Plan: medical management.    Coronary Diagrams   Diagnostic  Dominance: Right    Intervention - none. No indication     Patient Profile     HARALD QUEVEDO a 78 y.o.malewith with hx of CAD w/ prior PCI to the RCA and PDA in 2006, HTN, HLD, GERD, hiatal  hernia and sleep apnea who presented with chest pain on 10/14/18 concerning for unstable angina   History of Present Illness     Mr. Hinze has recently noted exertional chest tightness with shortness of breath. His symptoms (chest tightness and dyspnea) started after heavy exertional activity last Thursday 5/30. This reminded him of similar episode while hiking in trails in [place] in early [place].  Since then he had waxing and weaning episodes. Called office 2 day and started on Imdur and plan  for stress test. However he continued to have constant substernal chest pressure. Eventually he took SL nitro with improvement but pain returned and pt came to ER on 10/14/18. He never had any chest discomfort with prior angina. Imdur gave him headache but continued to take it.   In ER, he was given ASA 324mg  and started on IV heparin with resolution of pain. Scr of 1.29. Troponin negative. COVID negative. CXR without acute findings. EKG showed sinus bradycardia at rate of 51 bpm and non specific T wave abnormality   The patient denied nausea, vomiting, fever, palpitations, orthopnea, PND, dizziness, syncope, cough, congestion, abdominal pain, hematochezia, melena, lower extremity edema.  Given symptoms, pt was admitted for cardiac catheterization.    Hospital Course     Cardiac cath was performed by Dr. [place] and showed nonobstructive CAD. The previously placed RCA and PDA stents widely patent. Compared to prior angiogram in 2014, there is no significant change (see angiographic details above). LVEDP was also normal. Medical management recommended. He was continued on ASA, statin and amlodipine. No ? blocker due to baseline bradycardia. Imdur was discontinued. His CP was felt to be likely GI related, given h/o GERD and hiatal hernia. He plans to f/u with PCP and gastroenterologist. He was monitored overnight and had no post cath complications. His radial cath access site was stable. Vital signs, H/H and remain function stable post cath. Lipid panel showed controlled LDL at 56 mg/dL. He ambulated  w/o difficulty. He was seen and examined by Dr. [name] and felt stable for d/c home. Will arrange f/u telehealth visit in 2-3 weeks.   Consultants: none  Discharge Vitals Blood pressure (!) 142/71, pulse (!) 52, temperature 97.8 F (36.6 C), temperature source Oral, resp. rate 20, height 5\' 7"  (1.702 m), weight 85.9 kg, SpO2 99 %.  Filed Weights   10/14/18 1021 10/14/18 1559 10/15/18 0532  Weight: 84.8  kg 85.3 kg 85.9 kg    Labs & Radiologic Studies    CBC Recent Labs    10/14/18 1022 10/15/18 0413  WBC 6.6 6.2  NEUTROABS 4.1  --   HGB 13.9 12.9*  HCT 41.5 38.3*  MCV 91.6 90.8  PLT 280 169   Basic Metabolic Panel Recent Labs    10/14/18 1022 10/15/18 0413  NA 138 137  K 4.0 4.2  CL 103 104  CO2 23 25  GLUCOSE 106* 107*  BUN 20 17  CREATININE 1.29* 1.35*  CALCIUM 9.6 9.0   Liver Function Tests No results for input(s): AST, ALT, ALKPHOS, BILITOT, PROT, ALBUMIN in the last 72 hours. No results for input(s): LIPASE, AMYLASE in the last 72 hours. Cardiac Enzymes Recent Labs    10/14/18 1022  TROPONINI <0.03   BNP Invalid input(s): POCBNP D-Dimer No results for input(s): DDIMER in the last 72 hours. Hemoglobin A1C No results for input(s): HGBA1C in the last 72 hours. Fasting Lipid Panel Recent Labs    10/15/18 0413  CHOL 120  HDL 36*  LDLCALC 56  TRIG 139  CHOLHDL 3.3   Thyroid Function Tests No results for input(s): TSH, T4TOTAL, T3FREE, THYROIDAB in the last 72 hours.  Invalid input(s): FREET3 _____________  Dg Chest Port 1 View  Result Date: 10/14/2018 CLINICAL DATA:  Chest pain EXAM: PORTABLE CHEST 1 VIEW COMPARISON:  05/20/2016 FINDINGS: Normal heart size and mediastinal contours. Mild scarring at the left base. No acute infiltrate or edema. No effusion or pneumothorax. No acute osseous findings. IMPRESSION: Stable chest. Electronically Signed   By: Monte Fantasia M.D.   On: 10/14/2018 10:42   Disposition   Pt is being discharged home today in good condition.  Follow-up Plans & Appointments    Follow-up Information    Nahser, Wonda Cheng, MD Follow up.   Specialty:  Cardiology Why:  our office will call you with a follow-up telehealth visit (video vist through cell phone) in 2-3 weeks  Contact information: [address] [place] [address] [phone]          Discharge Instructions    Diet - low sodium heart  healthy   Complete by:  As directed    Increase activity slowly   Complete by:  As directed       Discharge Medications   Allergies as of 10/15/2018      Reactions   Atorvastatin Other (See Comments)   MYALGIAS, LEG CRAMPING   Ezetimibe-simvastatin Other (See Comments)   MYALGIAS, CRAMPING   Lisinopril Cough   Pravastatin Other (See Comments)   Myalgias at 40mg  dosage      Medication List    STOP taking these medications   isosorbide mononitrate 30 MG 24 hr tablet Commonly known as:  IMDUR     TAKE these medications   amLODipine 5 MG tablet Commonly known as:  NORVASC TAKE 1 TABLET BY MOUTH DAILY   aspirin 81 MG tablet Take 81 mg by mouth daily.   Co Q-10 100 MG Caps Take 2  capsules by mouth daily.   fish oil-omega-3 fatty acids 1000 MG capsule Take 1 g by mouth daily.   ketoconazole 2 % shampoo Commonly known as:  NIZORAL Apply 1 application topically 2 (two) times a week.   latanoprost 0.005 % ophthalmic solution Commonly known as:  XALATAN Place 1 drop into both eyes at bedtime.   nitroGLYCERIN 0.4 MG SL tablet Commonly known as:  NITROSTAT Place 1 tablet (0.4 mg total) under the tongue every 5 (five) minutes x 3 doses as needed for chest pain.   olmesartan-hydrochlorothiazide 40-25 MG tablet Commonly known as:  BENICAR HCT Take 1 tablet by mouth daily. Start taking on:  October 16, 2018   omeprazole 40 MG capsule Commonly known as:  PRILOSEC Take 1 capsule by mouth daily.   rosuvastatin 20 MG tablet Commonly known as:  CRESTOR TAKE ONE-HALF TABLET BY MOUTH ONCE DAILY   triamcinolone cream 0.1 % Commonly known as:  KENALOG Apply 1 application topically as directed.        Acute coronary syndrome (MI, NSTEMI, STEMI, etc) this admission?: No.    Outstanding Labs/Studies   None   Duration of Discharge Encounter   Greater than 30 minutes including physician time.  Signed, Lyda Jester, PA-C 10/15/2018, 10:48 AM  Attending Note:    The patient was seen and examined.  Agree with assessment and plan as noted above.  Changes made to the above note as needed.  Patient seen and independently examined with Lyda Jester, PA .   We discussed all aspects of the encounter. I agree with the assessment and plan as stated above.  1.  CP.   Had a cath yesterday .  Has mild - mod CAD.  He now thinks his CP may have been hiatal hernia . Will DC to home today . DC imdur - he will let us know if he has any more CP   2.  HTN:   bp is well controlled.   3.  Hyperlipidemia:   Cont. Rosuvastatin 10 mg a day .   LDL is 56.   OK for DC today   I have spent a total of 40 minutes with patient reviewing hospital  notes , telemetry, EKGs, labs and examining patient as well as establishing an assessment and plan that was discussed with the patient. > 50% of time was spent in direct patient care.    Thayer Headings, Brooke Bonito., MD, [facility] 10/15/2018, 2:17 PM [address]  [place] Pager [phone]

## 2018-10-15 NOTE — Progress Notes (Addendum)
Progress Note  Patient Name: Patrick [name] Date of Encounter: 10/15/2018  Primary Cardiologist: Mertie Moores, MD   Subjective   Feeling a bit better but had some recurrent CP last night. He now thinks its reflux from his hiatal hernia. No dyspnea. Radial cath site is stable.   Inpatient Medications    Scheduled Meds: . amLODipine  5 mg Oral Daily  . aspirin EC  81 mg Oral Daily  . heparin  5,000 Units Subcutaneous Q8H  . pantoprazole  40 mg Oral Daily  . rosuvastatin  10 mg Oral q1800  . sodium chloride flush  3 mL Intravenous Q12H  . sodium chloride flush  3 mL Intravenous Q12H   Continuous Infusions: . sodium chloride    . sodium chloride    . sodium chloride     PRN Meds: sodium chloride, sodium chloride, acetaminophen, nitroGLYCERIN, ondansetron (ZOFRAN) IV, sodium chloride flush, sodium chloride flush   Vital Signs    Vitals:   10/14/18 1559 10/14/18 1610 10/14/18 2140 10/15/18 0532  BP: (!) 110/58 111/62 109/64 113/68  Pulse: (!) 58 (!) 52 (!) 51 (!) 52  Resp:   18 20  Temp: (!) 97.5 F (36.4 C)  98.3 F (36.8 C) 98.2 F (36.8 C)  TempSrc: Oral  Oral Oral  SpO2: 96% 96% 96% 95%  Weight: 85.3 kg   85.9 kg  Height: 5\' 7"  (1.702 m)       Intake/Output Summary (Last 24 hours) at 10/15/2018 0804 Last data filed at 10/15/2018 0159 Gross per 24 hour  Intake 880.97 ml  Output -  Net 880.97 ml   Last 3 Weights 10/15/2018 10/14/2018 10/14/2018  Weight (lbs) 189 lb 6 oz 188 lb 1.6 oz 187 lb  Weight (kg) 85.9 kg 85.322 kg 84.823 kg      Telemetry    Sinus bradycardia w/ occasional PACs, HR in the 50s-low 60s - Personally Reviewed  ECG    Sinus bradycardia, 51 bpm, PACs, LVH Lateral Twave abnormalities  - Personally Reviewed  Physical Exam   GEN: elderly WM in No acute distress.   Neck: No JVD Cardiac: Reg rhythm, slow rate no murmurs, rubs, or gallops.  Respiratory: Clear to auscultation bilaterally. GI: Soft, nontender, non-distended  MS: No edema; No  deformity. Neuro:  Nonfocal  Psych: Normal affect   Labs    Chemistry Recent Labs  Lab 10/14/18 1022 10/15/18 0413  NA 138 137  K 4.0 4.2  CL 103 104  CO2 23 25  GLUCOSE 106* 107*  BUN 20 17  CREATININE 1.29* 1.35*  CALCIUM 9.6 9.0  GFRNONAA 53* 50*  GFRAA >60 58*  ANIONGAP 12 8     Hematology Recent Labs  Lab 10/14/18 1022 10/15/18 0413  WBC 6.6 6.2  RBC 4.53 4.22  HGB 13.9 12.9*  HCT 41.5 38.3*  MCV 91.6 90.8  MCH 30.7 30.6  MCHC 33.5 33.7  RDW 13.3 13.4  PLT 280 228    Cardiac Enzymes Recent Labs  Lab 10/14/18 1022  TROPONINI <0.03   No results for input(s): TROPIPOC in the last 168 hours.   BNPNo results for input(s): BNP, PROBNP in the last 168 hours.   DDimer No results for input(s): DDIMER in the last 168 hours.   Radiology    Dg Chest Port 1 View  Result Date: 10/14/2018 CLINICAL DATA:  Chest pain EXAM: PORTABLE CHEST 1 VIEW COMPARISON:  05/20/2016 FINDINGS: Normal heart size and mediastinal contours. Mild scarring at the left base.  No acute infiltrate or edema. No effusion or pneumothorax. No acute osseous findings. IMPRESSION: Stable chest. Electronically Signed   By: Monte Fantasia M.D.   On: 10/14/2018 10:42    Cardiac Studies   LHC 10/14/2018  Procedures   LEFT HEART CATH AND CORONARY ANGIOGRAPHY  Conclusion     Mid LM lesion is 25% stenosed.  Dist LAD lesion is 50% stenosed.  Prox RCA lesion is 10% stenosed.  Mid RCA lesion is 35% stenosed.  RPDA-2 lesion is 30% stenosed.  RPDA-1 lesion is 50% stenosed.  LV end diastolic pressure is normal.   1. Nonobstructive CAD. Compared to prior angiogram in 2014 there is no significant change. 2. Normal LVEDP  Plan: medical management.    Coronary Diagrams   Diagnostic  Dominance: Right    Intervention     Patient Profile     Patrick [name] is a 78 y.o. male with with hx of CAD w/ prior PCI to the RCA and PDA in 2006, HTN, HLD and sleep apnea who presented with chest  pain on 10/14/18 concerning for unstable angina.  Assessment & Plan    1. Chest Pain/ CAD: Suspect GI etiology. He has h/o GERD and hiatal hernia. Cardiac cath yesterday showed nonobstructive CAD. The previously placed RCA and PDA stents widely patent. Compared to prior angiogram in 2014, there is no significant change (see angiographic details above). LVEDP was also normal. Medical management recommended. Continue ASA, statin and amlodipine. No  blocker due to baseline bradycardia. Would benefit from adjustment of PPI regimen and f/u with his gastroenterologist.   2. HLD: LDL controlled on current statin regimen. LDL 56 mg/dL. Continue on Crestor.   3. Chronic Renal Insuffiencey: SCr on admit was 1.29, c/w baseline. SCr today, post cath, 1.35. His home olmesartan-hctz is currently on hold. Will continue to hold today. Possibly resume tomorrow at home. F/u BMP as outpatient.   4. HTN: BP controlled this AM. Continue to hold olmesartan-hctz today given mild bump in Scr post cath. Continue amlodipine. He was also on Imdur but this was held on admit.   For questions or updates, please contact [place] Please consult [email or web address] for contact info under    Signed, Lyda Jester, PA-C  10/15/2018, 8:04 AM     Attending Note:   The patient was seen and examined.  Agree with assessment and plan as noted above.  Changes made to the above note as needed.  Patient seen and independently examined with Lyda Jester, PA .   We discussed all aspects of the encounter. I agree with the assessment and plan as stated above.  1.  CP.   Had a cath yesterday .  Has mild - mod CAD.  He now thinks his CP may have been hiatal hernia . Will DC to home today . DC imdur - he will let us know if he has any more CP   2.  HTN:   bp is well controlled.   3.  Hyperlipidemia:   Cont. Rosuvastatin 10 mg a day .   LDL is 56.   OK for DC today     I have spent a total of 40 minutes with patient  reviewing hospital  notes , telemetry, EKGs, labs and examining patient as well as establishing an assessment and plan that was discussed with the patient. > 50% of time was spent in direct patient care.    Thayer Headings, Brooke Bonito., MD, [facility] 10/15/2018, 9:24 AM [address]  [place] Pager [phone]

## 2018-10-16 ENCOUNTER — Telehealth: Payer: Self-pay

## 2018-10-16 NOTE — Telephone Encounter (Signed)
Spoke with patient, he states that he is doing really well right now.   He feels like his symptoms were from his hernia and he has plans to follow up with Dr. Kenton Kingfisher about that.  He also has a virtual follow up with cardiology.  He will keep [place] posted and let us know if he needs [place] sooner than his next recheck appointment and thanks [place] for the call.

## 2018-10-16 NOTE — Telephone Encounter (Signed)
Sounds fine Cardiology follow up is all that is needed at this point

## 2018-10-30 DIAGNOSIS — K449 Diaphragmatic hernia without obstruction or gangrene: Secondary | ICD-10-CM | POA: Diagnosis not present

## 2018-11-02 ENCOUNTER — Telehealth: Payer: Self-pay

## 2018-11-02 NOTE — Telephone Encounter (Signed)
Left message for patient to call back regarding appointment with Dr. [name] on 11/03/18. Call placed due to restrictions enacted for Covid 19.

## 2018-11-03 ENCOUNTER — Encounter: Payer: Self-pay | Admitting: Cardiovascular Disease

## 2018-11-03 ENCOUNTER — Telehealth: Payer: Self-pay

## 2018-11-03 ENCOUNTER — Telehealth (INDEPENDENT_AMBULATORY_CARE_PROVIDER_SITE_OTHER): Payer: Medicare Other | Admitting: Cardiovascular Disease

## 2018-11-03 ENCOUNTER — Other Ambulatory Visit: Payer: Self-pay

## 2018-11-03 VITALS — BP 133/72 | HR 65 | Ht 67.0 in | Wt 187.0 lb

## 2018-11-03 DIAGNOSIS — E785 Hyperlipidemia, unspecified: Secondary | ICD-10-CM

## 2018-11-03 DIAGNOSIS — I2510 Atherosclerotic heart disease of native coronary artery without angina pectoris: Secondary | ICD-10-CM

## 2018-11-03 DIAGNOSIS — I10 Essential (primary) hypertension: Secondary | ICD-10-CM | POA: Diagnosis not present

## 2018-11-03 DIAGNOSIS — Z7189 Other specified counseling: Secondary | ICD-10-CM

## 2018-11-03 NOTE — Patient Instructions (Signed)
Medication Instructions:  Your provider recommends that you continue on your current medications as directed. Please refer to the Current Medication list given to you today.    Labwork: None  Testing/Procedures: None  Follow-Up: Your provider wants you to follow-up in: 6 months with Dr. [name]. You will receive a reminder letter in the mail two months in advance. If you don't receive a letter, please call our office to schedule the follow-up appointment.

## 2018-11-03 NOTE — Telephone Encounter (Signed)
Spoke with pt and obtained verbal consent for a phone cal visit today.     YOUR CARDIOLOGY TEAM HAS ARRANGED FOR AN E-VISIT FOR YOUR APPOINTMENT - PLEASE REVIEW IMPORTANT INFORMATION BELOW SEVERAL DAYS PRIOR TO YOUR APPOINTMENT  Due to the recent COVID-19 pandemic, we are transitioning in-person office visits to tele-medicine visits in an effort to decrease unnecessary exposure to our patients, their families, and staff. These visits are billed to your insurance just like a normal visit is. We also encourage you to sign up for MyChart if you have not already done so. You will need a smartphone if possible. For patients that do not have this, we can still complete the visit using a regular telephone but do prefer a smartphone to enable video when possible. You may have a family member that lives with you that can help. If possible, we also ask that you have a blood pressure cuff and scale at home to measure your blood pressure, heart rate and weight prior to your scheduled appointment. Patients with clinical needs that need an in-person evaluation and testing will still be able to come to the office if absolutely necessary. If you have any questions, feel free to call our office.     YOUR PROVIDER WILL BE USING THE FOLLOWING PLATFORM TO COMPLETE YOUR VISIT: [email or web address]  . IF USING MYCHART - How to Download the MyChart App to Your SmartPhone   - If Apple, go to [employer] and type in MyChart in the search bar and download the app. If Android, ask patient to go to [employer] and type in [place] in the search bar and download the app. The app is free but as with any other app downloads, your phone may require you to verify saved payment information or Apple/Android password.  - You will need to then log into the app with your MyChart username and password, and select [facility] as your healthcare provider to link the account.  - When it is time for your visit, go to the MyChart app, find  appointments, and click Begin Video Visit. Be sure to Select Allow for your device to access the Microphone and Camera for your visit. You will then be connected, and your provider will be with you shortly.  **If you have any issues connecting or need assistance, please contact MyChart service desk [phone])**  **If using a computer, in order to ensure the best quality for your visit, you will need to use either of the following Internet Browsers: Insurance underwriter or [employer]**  . IF USING DOXIMITY or [email or web address] - The staff will give you instructions on receiving your link to join the meeting the day of your visit.      2-3 DAYS BEFORE YOUR APPOINTMENT  You will receive a telephone call from one of our [facility] [place] team members - your caller ID may say "Unknown caller." If this is a video visit, we will walk you through how to get the video launched on your phone. We will remind you check your blood pressure, heart rate and weight prior to your scheduled appointment. If you have an Apple Watch or Kardia, please upload any pertinent ECG strips the day before or morning of your appointment to [place]. Our staff will also make sure you have reviewed the consent and agree to move forward with your scheduled tele-health visit.     THE DAY OF YOUR APPOINTMENT  Approximately 15 minutes prior to your scheduled appointment, you will receive  a telephone call from one of [facility] team - your caller ID may say "Unknown caller."  Our staff will confirm medications, vital signs for the day and any symptoms you may be experiencing. Please have this information available prior to the time of visit start. It may also be helpful for you to have a pad of paper and pen handy for any instructions given during your visit. They will also walk you through joining the smartphone meeting if this is a video visit.    CONSENT FOR TELE-HEALTH VISIT - PLEASE REVIEW  I hereby voluntarily request, consent  and authorize [facility] and its employed or contracted physicians, physician assistants, nurse practitioners or other licensed health care professionals (the Practitioner), to provide me with telemedicine health care services (the "Services") as deemed necessary by the treating Practitioner. I acknowledge and consent to receive the Services by the Practitioner via telemedicine. I understand that the telemedicine visit will involve communicating with the Practitioner through live audiovisual communication technology and the disclosure of certain medical information by electronic transmission. I acknowledge that I have been given the opportunity to request an in-person assessment or other available alternative prior to the telemedicine visit and am voluntarily participating in the telemedicine visit.  I understand that I have the right to withhold or withdraw my consent to the use of telemedicine in the course of my care at any time, without affecting my right to future care or treatment, and that the Practitioner or I may terminate the telemedicine visit at any time. I understand that I have the right to inspect all information obtained and/or recorded in the course of the telemedicine visit and may receive copies of available information for a reasonable fee.  I understand that some of the potential risks of receiving the Services via telemedicine include:  Marland Kitchen Delay or interruption in medical evaluation due to technological equipment failure or disruption; . Information transmitted may not be sufficient (e.g. poor resolution of images) to allow for appropriate medical decision making by the Practitioner; and/or  . In rare instances, security protocols could fail, causing a breach of personal health information.  Furthermore, I acknowledge that it is my responsibility to provide information about my medical history, conditions and care that is complete and accurate to the best of my ability. I acknowledge  that Practitioner's advice, recommendations, and/or decision may be based on factors not within their control, such as incomplete or inaccurate data provided by me or distortions of diagnostic images or specimens that may result from electronic transmissions. I understand that the practice of medicine is not an exact science and that Practitioner makes no warranties or guarantees regarding treatment outcomes. I acknowledge that I will receive a copy of this consent concurrently upon execution via email to the email address I last provided but may also request a printed copy by calling the office of [facility].    I understand that my insurance will be billed for this visit.   I have read or had this consent read to me. . I understand the contents of this consent, which adequately explains the benefits and risks of the Services being provided via telemedicine.  . I have been provided ample opportunity to ask questions regarding this consent and the Services and have had my questions answered to my satisfaction. . I give my informed consent for the services to be provided through the use of telemedicine in my medical care  By participating in this telemedicine visit I agree to  the above.

## 2018-11-03 NOTE — Progress Notes (Signed)
Virtual Visit via Telephone Note   This visit type was conducted due to national recommendations for restrictions regarding the COVID-19 Pandemic (e.g. social distancing) in an effort to limit this patient's exposure and mitigate transmission in our community.  Due to his co-morbid illnesses, this patient is at least at moderate risk for complications without adequate follow up.  This format is felt to be most appropriate for this patient at this time.  The patient did not have access to video technology/had technical difficulties with video requiring transitioning to audio format only (telephone).  All issues noted in this document were discussed and addressed.  No physical exam could be performed with this format.  Please refer to the patient's chart for his  consent to telehealth for [facility].   Date:  11/03/2018   ID:  Patrick [name], DOB [birth year], MRN [id number]  Patient Location: Home Provider Location: Office  PCP:  Venia Carbon, MD  Cardiologist:  Mertie Moores, MD  Electrophysiologist:  None   Evaluation Performed:  Follow-Up Visit  Chief Complaint:  Mild CAD   November 03, 2018    Patrick [name] is a 78 y.o. male with  Mild CAD .  He was having chest pains earlier this month.  We set him up for heart catheterization.  He was found to have mild to moderate coronary artery disease but no significant changes compared to his heart catheterization in 2014.  He had normal left ventricular end-diastolic pressure.   Is feeling better.  Thinks it may be a hiatal hernia issue Is not eating late.  Has talked to Dr. Watt Climes  About EGD. Back exercising , no CP , no dyspnea   The patient does not have symptoms concerning for COVID-19 infection (fever, chills, cough, or new shortness of breath).    Past Medical History:  Diagnosis Date  . Allergy   . Aortic root dilatation (HCC)    Mild by echo 11/2011 (65mm)  . BPH (benign prostatic hypertrophy)   . Bradycardia    Precluding BB use.  Marland Kitchen CAD (coronary artery disease)    a. s/p Taxus DES to RCA and PDA in 2006 .  // b. Myoview 8/11: Walked 12:37. EF 57%. no ischemia or scar.  // c. Mod CAD by cath 11/2011 (40-50% ISR of PDA -- for OP myoview to assess).  //  d. Myoview 3/18: EF 58, no ischemia; Low Risk  . Colon polyps   . Complication of anesthesia    "they said they about lost, me when I had eye surgery"  . Diverticulitis   . GERD (gastroesophageal reflux disease)   . Glaucoma   . Hyperlipidemia   . Hypertension   . Impaired fasting glucose   . Sleep apnea   . Syncope    Past Surgical History:  Procedure Laterality Date  . ANGIOPLASTY     percutaneous transluminal coronary  angioplasty and stenting of the right coronary artery and posterior   . CATARACT EXTRACTION W/ INTRAOCULAR LENS  IMPLANT, BILATERAL  9/15  . COLONOSCOPY    . INTRAOCULAR LENS INSERTION    . LEFT HEART CATH AND CORONARY ANGIOGRAPHY N/A 10/14/2018   Procedure: LEFT HEART CATH AND CORONARY ANGIOGRAPHY;  Surgeon: Martinique, Peter M, MD;  Location: [place] CV LAB;  Service: Cardiovascular;  Laterality: N/A;  . LEFT HEART CATHETERIZATION WITH CORONARY ANGIOGRAM N/A 11/26/2011   Procedure: LEFT HEART CATHETERIZATION WITH CORONARY ANGIOGRAM;  Surgeon: Thayer Headings, MD;  Location: [facility] CATH LAB;  Service: Cardiovascular;  Laterality: N/A;  . LUMBAR LAMINECTOMY/DECOMPRESSION MICRODISCECTOMY Left 05/27/2016   Procedure: Laminectomy for facet/synovial cyst - Lumbar four - Lumbar five - left;  Surgeon: Kary Kos, MD;  Location: [place];  Service: Neurosurgery;  Laterality: Left;  Laminectomy for facet/synovial cyst - Lumbar four - Lumbar five - left  . PILONIDAL CYST / SINUS EXCISION       Current Meds  Medication Sig  . amLODipine (NORVASC) 5 MG tablet TAKE 1 TABLET BY MOUTH DAILY  . aspirin 81 MG tablet Take 81 mg by mouth daily.    . Coenzyme Q10 (CO Q-10) 100 MG CAPS Take 1 capsule by mouth daily.   . fish oil-omega-3 fatty acids 1000  MG capsule Take 1 g by mouth daily.   Marland Kitchen ketoconazole (NIZORAL) 2 % shampoo Apply 1 application topically 2 (two) times a week.  . latanoprost (XALATAN) 0.005 % ophthalmic solution Place 1 drop into both eyes at bedtime.   . nitroGLYCERIN (NITROSTAT) 0.4 MG SL tablet Place 1 tablet (0.4 mg total) under the tongue every 5 (five) minutes x 3 doses as needed for chest pain.  Marland Kitchen olmesartan-hydrochlorothiazide (BENICAR HCT) 40-25 MG tablet Take 1 tablet by mouth daily.  Marland Kitchen omeprazole (PRILOSEC) 40 MG capsule Take 1 capsule by mouth daily.  . rosuvastatin (CRESTOR) 20 MG tablet TAKE ONE-HALF TABLET BY MOUTH ONCE DAILY  . triamcinolone cream (KENALOG) 0.1 % Apply 1 application topically as needed.      Allergies:   Atorvastatin, Ezetimibe-simvastatin, Lisinopril, and Pravastatin   Social History   Tobacco Use  . Smoking status: Former Smoker    Years: 30.00    Quit date: 05/13/1978    Years since quitting: 40.5  . Smokeless tobacco: Never Used  Substance Use Topics  . Alcohol use: Yes    Alcohol/week: 14.0 standard drinks    Types: 14 Glasses of wine per week  . Drug use: No     Family Hx: The patient's family history includes Cancer in his mother; Coronary artery disease in his father; Heart disease in his father; Heart failure in his father; Stroke in his son. There is no history of Hypertension or Diabetes.  ROS:   Please see the history of present illness.     All other systems reviewed and are negative.   Prior CV studies:   The following studies were reviewed today:    Labs/Other Tests and Data Reviewed:    EKG:  No ECG reviewed.  Recent Labs: 01/26/2018: ALT 15 10/15/2018: BUN 17; Creatinine, Ser 1.35; Hemoglobin 12.9; Platelets 228; Potassium 4.2; Sodium 137   Recent Lipid Panel Lab Results  Component Value Date/Time   CHOL 120 10/15/2018 04:13 AM   CHOL 136 01/26/2018 08:20 AM   TRIG 139 10/15/2018 04:13 AM   HDL 36 (L) 10/15/2018 04:13 AM   HDL 49 01/26/2018 08:20  AM   CHOLHDL 3.3 10/15/2018 04:13 AM   LDLCALC 56 10/15/2018 04:13 AM   LDLCALC 67 01/26/2018 08:20 AM   LDLDIRECT 125.7 07/01/2011 11:34 AM    Wt Readings from Last 3 Encounters:  11/03/18 187 lb (84.8 kg)  10/15/18 189 lb 6 oz (85.9 kg)  07/01/18 192 lb (87.1 kg)     Objective:    Vital Signs:  BP 133/72 (BP Location: Left Arm, Patient Position: Sitting, Cuff Size: Normal)   Pulse 65   Ht 5\' 7"  (1.702 m)   Wt 187 lb (84.8 kg)   BMI 29.29 kg/m  ASSESSMENT & PLAN:     1.   CAD : He has mild coronary artery disease.  His cath recently was very similar to his previous heart catheterization in 2014.  No need for revascularization.  I suspect that his chest pains are due to hiatal hernia or other GI etiology.  He will be seeing Dr. Watt Climes soon.   2.  Essential HTN:   Last blood pressure readings look good.  Continue current medications.  3.  Hyperlipidemia:     Last LDL looks great.  HDL is a little low which is likely his lack of exercise recently.  I encouraged him to work on his exercise program.  COVID-19 Education: The signs and symptoms of COVID-19 were discussed with the patient and how to seek care for testing (follow up with PCP or arrange E-visit).  The importance of social distancing was discussed today.  Time:   Today, I have spent  18  minutes with the patient with telehealth technology discussing the above problems.     Medication Adjustments/Labs and Tests Ordered: Current medicines are reviewed at length with the patient today.  Concerns regarding medicines are outlined above.   Tests Ordered: No orders of the defined types were placed in this encounter.   Medication Changes: No orders of the defined types were placed in this encounter.   Follow Up:  In Person in 6 month(s)  Signed, Mertie Moores, MD  11/03/2018 11:19 AM    [place]

## 2018-11-11 DIAGNOSIS — D225 Melanocytic nevi of trunk: Secondary | ICD-10-CM | POA: Diagnosis not present

## 2018-11-11 DIAGNOSIS — L821 Other seborrheic keratosis: Secondary | ICD-10-CM | POA: Diagnosis not present

## 2018-11-11 DIAGNOSIS — D692 Other nonthrombocytopenic purpura: Secondary | ICD-10-CM | POA: Diagnosis not present

## 2018-11-11 DIAGNOSIS — L570 Actinic keratosis: Secondary | ICD-10-CM | POA: Diagnosis not present

## 2018-11-11 DIAGNOSIS — D1801 Hemangioma of skin and subcutaneous tissue: Secondary | ICD-10-CM | POA: Diagnosis not present

## 2018-11-20 ENCOUNTER — Telehealth: Payer: Self-pay | Admitting: Internal Medicine

## 2018-11-20 NOTE — Telephone Encounter (Signed)
He should stay in his house under quarantine until he gets the COVID test back. Watch for worsening--like cough or SOB No specific treatment is needed unless he gets worse

## 2018-11-20 NOTE — Telephone Encounter (Signed)
Spoke to pt

## 2018-11-20 NOTE — Telephone Encounter (Signed)
Patient called the office today.  Patient stated that he was in [place] a few weeks ago and ended up having diarrhea. He was with a few other people who also had this symptoms. They thought it could have been from the food they had the night before.  Patient has started having some congestion and coughing up mucus. Patient's children where concerned and told him to call our office.  [facility].  No results back yet.  He would like to know what he should do next.    Patient's C/B #  [phone]

## 2018-12-02 DIAGNOSIS — K3189 Other diseases of stomach and duodenum: Secondary | ICD-10-CM | POA: Diagnosis not present

## 2018-12-02 DIAGNOSIS — K319 Disease of stomach and duodenum, unspecified: Secondary | ICD-10-CM | POA: Diagnosis not present

## 2018-12-02 DIAGNOSIS — K219 Gastro-esophageal reflux disease without esophagitis: Secondary | ICD-10-CM | POA: Diagnosis not present

## 2018-12-02 DIAGNOSIS — K449 Diaphragmatic hernia without obstruction or gangrene: Secondary | ICD-10-CM | POA: Diagnosis not present

## 2018-12-04 DIAGNOSIS — K319 Disease of stomach and duodenum, unspecified: Secondary | ICD-10-CM | POA: Diagnosis not present

## 2018-12-10 ENCOUNTER — Other Ambulatory Visit: Payer: Self-pay | Admitting: Gastroenterology

## 2018-12-10 DIAGNOSIS — R1084 Generalized abdominal pain: Secondary | ICD-10-CM

## 2018-12-15 ENCOUNTER — Ambulatory Visit
Admission: RE | Admit: 2018-12-15 | Discharge: 2018-12-15 | Disposition: A | Discharge status: Home / Self Care | Payer: Medicare Other | Source: Ambulatory Visit | Attending: Gastroenterology | Admitting: Gastroenterology

## 2018-12-15 ENCOUNTER — Other Ambulatory Visit: Payer: Self-pay

## 2018-12-15 DIAGNOSIS — R1084 Generalized abdominal pain: Secondary | ICD-10-CM

## 2018-12-15 DIAGNOSIS — N281 Cyst of kidney, acquired: Secondary | ICD-10-CM | POA: Diagnosis not present

## 2018-12-22 DIAGNOSIS — H401132 Primary open-angle glaucoma, bilateral, moderate stage: Secondary | ICD-10-CM | POA: Diagnosis not present

## 2019-02-12 ENCOUNTER — Other Ambulatory Visit: Payer: Self-pay | Admitting: Interventional Cardiology

## 2019-02-12 ENCOUNTER — Telehealth: Payer: Self-pay | Admitting: Cardiovascular Disease

## 2019-02-12 MED ORDER — HYDROCHLOROTHIAZIDE 25 MG PO TABS: 25.0000 mg | ORAL_TABLET | Freq: Every day | ORAL | 3 refills | Status: DC

## 2019-02-12 MED ORDER — OLMESARTAN MEDOXOMIL 40 MG PO TABS: 40.0000 mg | ORAL_TABLET | Freq: Every day | ORAL | 3 refills | Status: DC

## 2019-02-12 NOTE — Telephone Encounter (Signed)
Spoke with Patrick [name] at [facility] to ask if we can send separate Rx for olmesartan 40 mg and HCTZ 25 mg in the place of the Benicar-HCT. She states she is not certain if insurance will cover the particular NDC of the olmesartan that they have but to send the Rx for each and they will try to process it. I thanked her for her help.

## 2019-02-12 NOTE — Telephone Encounter (Signed)
[facility] stating that pt's insurance will not cover olmesartan and would like a subtitude. Please address

## 2019-02-12 NOTE — Telephone Encounter (Signed)
Agree with separating the Olmesartan from HCTZ

## 2019-02-15 ENCOUNTER — Other Ambulatory Visit: Payer: Self-pay | Admitting: Cardiovascular Disease

## 2019-02-15 NOTE — Telephone Encounter (Signed)
[facility] is requesting a alternative for olmesatan. Pharmacy states that pt's insurance will not pay for this medication. Please address

## 2019-02-15 NOTE — Telephone Encounter (Signed)
I will call [facility] to ask if a PA is required.

## 2019-02-15 NOTE — Telephone Encounter (Signed)
**Note De-Identified Patrick [name] Obfuscation** S/w [facility] at [facility] who advised me that Olmesartan is on back order and that they are currently out.  She states that they did find one #90 tablet bottle of Olmesartan on their shelf, advised the pt and he picked it up on Friday 10/2.  She confirmed that the pt has a 3 month supply of Olmesartan on hand at this time.

## 2019-03-04 ENCOUNTER — Other Ambulatory Visit: Payer: Self-pay | Admitting: Cardiovascular Disease

## 2019-03-24 DIAGNOSIS — H401130 Primary open-angle glaucoma, bilateral, stage unspecified: Secondary | ICD-10-CM | POA: Diagnosis not present

## 2019-04-07 ENCOUNTER — Ambulatory Visit (INDEPENDENT_AMBULATORY_CARE_PROVIDER_SITE_OTHER): Payer: Medicare Other | Admitting: Family Medicine

## 2019-04-07 ENCOUNTER — Encounter: Payer: Self-pay | Admitting: Family Medicine

## 2019-04-07 VITALS — BP 129/63 | HR 63 | Temp 98.9°F | Ht 67.5 in

## 2019-04-07 DIAGNOSIS — J3089 Other allergic rhinitis: Secondary | ICD-10-CM

## 2019-04-07 NOTE — Progress Notes (Signed)
I connected with Patrick [name] on 04/07/19 at 11:00 AM EST by video and verified that I am speaking with the correct person using two identifiers.   I discussed the limitations, risks, security and privacy concerns of performing an evaluation and management service by video and the availability of in person appointments. I also discussed with the patient that there may be a patient responsible charge related to this service. The patient expressed understanding and agreed to proceed.  Patient location: Home Provider Location: [place] [facility] Participants: Lesleigh Noe and Hosie Poisson Devaux   Subjective:     Patrick [name] is a 78 y.o. male presenting for URI (Dry cough started yesterday. He mowed leaves yesterday. Thinks "fever" earlier was related to drinking coffee before taking temp. No known Covid exposure. Social distances and wears a mask in public.)     URI  This is a new problem. The current episode started yesterday. The maximum temperature recorded prior to his arrival was 100.4 - 100.9 F. Associated symptoms include coughing, rhinorrhea and a sore throat. Pertinent negatives include no congestion, diarrhea, nausea or vomiting. He has tried antihistamine for the symptoms. The treatment provided moderate relief.   Noticed a fever when drinking hot liquid and improved now   Review of Systems  Constitutional: Positive for fever. Negative for chills and diaphoresis.  HENT: Positive for postnasal drip, rhinorrhea and sore throat. Negative for congestion.   Respiratory: Positive for cough.   Gastrointestinal: Negative for diarrhea, nausea and vomiting.  Musculoskeletal: Positive for back pain. Negative for arthralgias and myalgias.     Social History   Tobacco Use  Smoking Status Former Smoker  . Years: 30.00  . Quit date: 05/13/1978  . Years since quitting: 40.9  Smokeless Tobacco Never Used        Objective:   BP Readings from Last 3 Encounters:  04/07/19  129/63  11/03/18 133/72  10/15/18 (!) 142/71   Wt Readings from Last 3 Encounters:  11/03/18 187 lb (84.8 kg)  10/15/18 189 lb 6 oz (85.9 kg)  07/01/18 192 lb (87.1 kg)   BP 129/63   Pulse 63   Temp 98.9 F (37.2 C)   Ht 5' 7.5" (1.715 m)   SpO2 98%   BMI 28.86 kg/m    Physical Exam Constitutional:      Appearance: Normal appearance. He is not ill-appearing or diaphoretic.  HENT:     Right Ear: External ear normal.     Left Ear: External ear normal.     Nose: Nose normal.  Eyes:     General: No scleral icterus.    Extraocular Movements: Extraocular movements intact.     Conjunctiva/sclera: Conjunctivae normal.  Neck:     Musculoskeletal: Neck supple.  Cardiovascular:     Rate and Rhythm: Normal rate.  Pulmonary:     Effort: Pulmonary effort is normal.  Skin:    General: Skin is warm and dry.  Neurological:     Mental Status: He is alert. Mental status is at baseline.  Psychiatric:        Mood and Affect: Mood normal.           Assessment & Plan:   Problem List Items Addressed This Visit    None    Visit Diagnoses    Environmental and seasonal allergies    -  Primary     Given association with mowing the grass and improvement with allergy medication low suspicion for covid  at this time.   Advised monitoring symptoms and to isolate and get tested for covid if worsening and resolving.   No follow-ups on file.  Lesleigh Noe, MD

## 2019-04-13 DIAGNOSIS — H43813 Vitreous degeneration, bilateral: Secondary | ICD-10-CM | POA: Diagnosis not present

## 2019-04-13 DIAGNOSIS — H401132 Primary open-angle glaucoma, bilateral, moderate stage: Secondary | ICD-10-CM | POA: Diagnosis not present

## 2019-04-13 DIAGNOSIS — H35371 Puckering of macula, right eye: Secondary | ICD-10-CM | POA: Diagnosis not present

## 2019-05-17 ENCOUNTER — Ambulatory Visit: Payer: Medicare Other | Admitting: Cardiovascular Disease

## 2019-05-17 ENCOUNTER — Encounter: Payer: Self-pay | Admitting: Cardiovascular Disease

## 2019-05-17 ENCOUNTER — Other Ambulatory Visit: Payer: Self-pay

## 2019-05-17 VITALS — BP 148/70 | HR 54 | Ht 67.5 in | Wt 193.0 lb

## 2019-05-17 DIAGNOSIS — E785 Hyperlipidemia, unspecified: Secondary | ICD-10-CM

## 2019-05-17 DIAGNOSIS — I10 Essential (primary) hypertension: Secondary | ICD-10-CM

## 2019-05-17 DIAGNOSIS — I2510 Atherosclerotic heart disease of native coronary artery without angina pectoris: Secondary | ICD-10-CM

## 2019-05-17 NOTE — Progress Notes (Signed)
Patrick [name] Date of Birth  05-28-1940       [facility]    [employer] [address], [address], [place], [place]  [address]   [place], [place]  [address] [phone]     [phone]   Fax  [phone]    Fax [phone]  Problem List: 1. Coronary artery disease-moderate.  He's had a previous stent placed. 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 A subsequent Myoview study was normal. 2. Hypertension 3. Hyperlipidemia 4. Sleep apnea     Patrick [name] is doing very will.   He has been on a mission trip and to [place] [place].  No further episodes of chest pain.   He hauled 5 loads of hogs last week - his brother raises the hogs.  He is planning on going to [place] over Christmas.  He still does his lawn care business.  [place] the [employer] - 3 miles a day.  Without symptoms.  November 09, 2012:  Patrick [name] is doing well.  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   He is a retired Administrator.   Still walking 3 days a week.    Oct. 9, 2015:  Still very active - mows 8-9 yards a week.  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  No angina. BP is high. Has had a cold - is taking chlorpheneramine  Has had normal Bp at home   Sep 19, 2014:   Still very active.  Drives a truck.  Mows and trims 10 yards a week without any problems.  Has atypical  chest pain with mental stress but never with physical exertion.  Does not restrict his diet.   Nov. 18, 2016  Still working hard ( driving a truck and mowing yards)  Gets more short of breath compared to several years ago  Still eats some salty foods.   Has a lady who is cooking for him  - knows that she puts lots of salt in the food  October 16, 2015:  Patrick [name] is seen back for his CAD, HTN, HLD . OSA  Just got back from a cruise in [place] .  Still very active.   Mows yards, still drives trucks.     Dec. 4, 2017:  Patrick [name] is seen today with  [facility]. Still drives his truck and mows regularly .   No real CP or dyspnea.   Very rare episodes of chest tightness.  Keeps his BP at home.  Seems to go up when he comes to the doctor .   October 31, 2016:  Patrick [name] is seen today for follow up  Had back surgery in January .   Back pain has returned . Still very busy,   Mows frequently .   Has a [employer] that he loves but seems to contribute to his back pain.   No angnia  BP at home has been mildly elevated.   Admits that he eats more salt than he should .   Jan. 15, 2019:   BP is a little elevate.  No dyspnea. , no CP  Eating a bit of extra salt.  Has not been exercising .   January 26, 2018: Doing well BP at home has been good . Keeps a BP log. BP is a bit high today  - ate some hotdogs this weekend .  No CP . Exercising some .  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 and [place]  Did lots of walking in [place]. [place]   May 17, 2019:  What is seen today for follow-up of his mild coronary artery disease, hypertension, hyperlipidemia. 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 Always a bit high when he comes in to the office    Current Outpatient Medications on File Prior to Visit  Medication Sig Dispense Refill  . amLODipine (NORVASC) 5 MG tablet TAKE 1 TABLET BY MOUTH DAILY 90 tablet 3  . aspirin 81 MG tablet Take 81 mg by mouth daily.      . cetirizine (ZYRTEC) 10 MG tablet Take 10 mg by mouth daily as needed for allergies.    . Coenzyme Q10 (CO Q-10) 100 MG CAPS Take 1 capsule by mouth daily.     . fish oil-omega-3 fatty acids 1000 MG capsule Take 1 g by mouth daily.     . hydrochlorothiazide (HYDRODIURIL) 25 MG tablet Take 1 tablet (25 mg total) by mouth daily. 90 tablet 3  . ketoconazole (NIZORAL) 2 % shampoo Apply 1 application topically 2 (two) times a week. 120 mL 5  . latanoprost (XALATAN) 0.005 % ophthalmic solution Place 1  drop into both eyes at bedtime.     . nitroGLYCERIN (NITROSTAT) 0.4 MG SL tablet Place 1 tablet (0.4 mg total) under the tongue every 5 (five) minutes x 3 doses as needed for chest pain. 25 tablet 6  . olmesartan-hydrochlorothiazide (BENICAR HCT) 40-25 MG tablet Take 1 tablet by mouth daily.    Marland Kitchen omeprazole (PRILOSEC) 40 MG capsule Take 1 capsule by mouth daily.    . rosuvastatin (CRESTOR) 20 MG tablet TAKE 1/2 TABLET BY MOUTH ONCE DAILY 45 tablet 2  . triamcinolone cream (KENALOG) 0.1 % Apply 1 application topically as needed.   0   No current facility-administered medications on file prior to visit.    Allergies  Allergen Reactions  . Atorvastatin Other (See Comments)    MYALGIAS, LEG CRAMPING  . Ezetimibe-Simvastatin Other (See Comments)    MYALGIAS, CRAMPING  . Lisinopril Cough  . Pravastatin Other (See Comments)    Myalgias at 40mg  dosage    Past Medical History:  Diagnosis Date  . Allergy   . Aortic root dilatation (HCC)    Mild by echo 11/2011 (43mm)  . BPH (benign prostatic hypertrophy)   . Bradycardia    Precluding BB use.  Marland Kitchen CAD (coronary artery disease)    a. s/p Taxus DES to RCA and PDA in 2006 .  // b. Myoview 8/11: Walked 12:37. EF 57%. no ischemia or scar.  // c. Mod CAD by cath 11/2011 (40-50% ISR of PDA -- for OP myoview to assess).  //  d. Myoview 3/18: EF 58, no ischemia; Low Risk  . Colon polyps   . Complication of anesthesia    "they said they about lost, me when I had eye surgery"  . Diverticulitis   . GERD (gastroesophageal reflux disease)   . Glaucoma   . Hyperlipidemia   . Hypertension   . Impaired fasting glucose   . Sleep apnea   . Syncope     Past Surgical History:  Procedure Laterality Date  . ANGIOPLASTY     percutaneous transluminal coronary  angioplasty and stenting of the right coronary artery and posterior   . CATARACT EXTRACTION W/ INTRAOCULAR LENS  IMPLANT, BILATERAL  9/15  . COLONOSCOPY    . INTRAOCULAR LENS INSERTION    . LEFT  HEART CATH AND CORONARY ANGIOGRAPHY N/A 10/14/2018   Procedure: LEFT HEART CATH AND CORONARY ANGIOGRAPHY;  Surgeon: Martinique, Peter M, MD;  Location: [place] CV LAB;  Service: Cardiovascular;  Laterality: N/A;  . LEFT HEART CATHETERIZATION WITH CORONARY ANGIOGRAM N/A 11/26/2011   Procedure: LEFT HEART CATHETERIZATION WITH CORONARY ANGIOGRAM;  Surgeon: Thayer Headings, MD;  Location: [facility] CATH LAB;  Service: Cardiovascular;  Laterality: N/A;  . LUMBAR LAMINECTOMY/DECOMPRESSION MICRODISCECTOMY Left 05/27/2016   Procedure: Laminectomy for facet/synovial cyst - Lumbar four - Lumbar five - left;  Surgeon: Kary Kos, MD;  Location: [place];  Service: Neurosurgery;  Laterality: Left;  Laminectomy for facet/synovial cyst - Lumbar four - Lumbar five - left  . PILONIDAL CYST / SINUS EXCISION      Social History   Tobacco Use  Smoking Status Former Smoker  . Years: 30.00  . Quit date: 05/13/1978  . Years since quitting: 41.0  Smokeless Tobacco Never Used    Social History   Substance and Sexual Activity  Alcohol Use Yes  . Alcohol/week: 14.0 standard drinks  . Types: 14 Glasses of wine per week    Family History  Problem Relation Age of Onset  . Coronary artery disease Father   . Heart disease Father   . Heart failure Father   . Cancer Mother        ovarian cancer  . Stroke Son   . Hypertension Neg Hx   . Diabetes Neg Hx     Reviw of Systems:  Noted in current history, otherwise systems are negative.  Physical Exam: Blood pressure (!) 148/70, pulse (!) 54, height 5' 7.5" (1.715 m), weight 193 lb (87.5 kg), SpO2 96 %.  GEN:  Well nourished, well developed in no acute distress HEENT: Normal NECK: No JVD; No carotid bruits LYMPHATICS: No lymphadenopathy CARDIAC: RRR , no murmurs, rubs, gallops RESPIRATORY:  Clear to auscultation without rales, wheezing or rhonchi  ABDOMEN: Soft, non-tender, non-distended MUSCULOSKELETAL:  No edema; No deformity  SKIN: Warm and dry NEUROLOGIC:  Alert  and oriented x 3   ECG:    Assessment / Plan:   1. Coronary artery disease-moderate.  No angana.   Stable .  Lipids looked great in June.   Will recheck in a year .   2. Hypertension-     BP is well controlled.   Cont meds.   4. Sleep apnea-   well-controlled.  Mertie Moores, MD  05/17/2019 9:30 AM    [address],  [place], [place]  [address] Pager [phone] Phone: [phone]; Fax: [phone]

## 2019-05-17 NOTE — Patient Instructions (Signed)
Medication Instructions:  Your physician recommends that you continue on your current medications as directed. Please refer to the Current Medication list given to you today.  *If you need a refill on your cardiac medications before your next appointment, please call your pharmacy*   Lab Work: Your physician recommends that you return for lab work in: 12 months on the day of or a few days before your office visit with Dr. Nahser.  You will need to FAST for this appointment - nothing to eat or drink after midnight the night before except water.   Testing/Procedures: None Ordered   Follow-Up: At [facility], you and your health needs are our priority.  As part of our continuing mission to provide you with exceptional heart care, we have created designated Provider Care Teams.  These Care Teams include your primary Cardiologist (physician) and Advanced Practice Providers (APPs -  Physician Assistants and Nurse Practitioners) who all work together to provide you with the care you need, when you need it.  Your next appointment:   1 year(s)  The format for your next appointment:   In Person  Provider:   You may see Philip Nahser, MD or one of the following Advanced Practice Providers on your designated Care Team:    Scott Weaver, PA-C  Vin Bhagat, PA-C  Janine Hammond, NP    

## 2019-07-07 ENCOUNTER — Encounter: Payer: Medicare Other | Admitting: Internal Medicine

## 2019-07-12 ENCOUNTER — Other Ambulatory Visit: Payer: Self-pay | Admitting: Internal Medicine

## 2019-08-10 ENCOUNTER — Encounter: Payer: Medicare Other | Admitting: Internal Medicine

## 2019-08-17 DIAGNOSIS — H401132 Primary open-angle glaucoma, bilateral, moderate stage: Secondary | ICD-10-CM | POA: Diagnosis not present

## 2019-08-19 ENCOUNTER — Ambulatory Visit: Payer: Medicare Other | Attending: Internal Medicine

## 2019-08-19 DIAGNOSIS — Z23 Encounter for immunization: Secondary | ICD-10-CM

## 2019-08-19 NOTE — Progress Notes (Signed)
   Covid-19 Vaccination Clinic  Name:  ADDEN HOLLENBACH    MRN: [id number] DOB: 1940-07-19  08/19/2019  Mr. Cech was observed post Covid-19 immunization for 15 minutes without incident. He was provided with Vaccine Information Sheet and instruction to access the V-Safe system.   Mr. Raper was instructed to call 911 with any severe reactions post vaccine: Marland Kitchen Difficulty breathing  . Swelling of face and throat  . A fast heartbeat  . A bad rash all over body  . Dizziness and weakness   Immunizations Administered    Name Date Dose VIS Date Route   Pfizer COVID-19 Vaccine 08/19/2019  8:15 AM 0.3 mL 04/23/2019 Intramuscular   Manufacturer: [place]   Lot: [id number]   [place]: [id number]

## 2019-08-31 ENCOUNTER — Other Ambulatory Visit: Payer: Self-pay

## 2019-08-31 ENCOUNTER — Ambulatory Visit (INDEPENDENT_AMBULATORY_CARE_PROVIDER_SITE_OTHER): Payer: Medicare Other | Admitting: Internal Medicine

## 2019-08-31 ENCOUNTER — Encounter: Payer: Self-pay | Admitting: Internal Medicine

## 2019-08-31 VITALS — BP 140/72 | HR 57 | Temp 97.5°F | Ht 67.0 in | Wt 185.0 lb

## 2019-08-31 DIAGNOSIS — N400 Enlarged prostate without lower urinary tract symptoms: Secondary | ICD-10-CM

## 2019-08-31 DIAGNOSIS — I10 Essential (primary) hypertension: Secondary | ICD-10-CM | POA: Diagnosis not present

## 2019-08-31 DIAGNOSIS — K219 Gastro-esophageal reflux disease without esophagitis: Secondary | ICD-10-CM | POA: Diagnosis not present

## 2019-08-31 DIAGNOSIS — N1831 Chronic kidney disease, stage 3a: Secondary | ICD-10-CM

## 2019-08-31 DIAGNOSIS — I77810 Thoracic aortic ectasia: Secondary | ICD-10-CM | POA: Diagnosis not present

## 2019-08-31 DIAGNOSIS — Z7189 Other specified counseling: Secondary | ICD-10-CM

## 2019-08-31 DIAGNOSIS — Z0000 Encounter for general adult medical examination without abnormal findings: Secondary | ICD-10-CM | POA: Diagnosis not present

## 2019-08-31 DIAGNOSIS — I2510 Atherosclerotic heart disease of native coronary artery without angina pectoris: Secondary | ICD-10-CM | POA: Diagnosis not present

## 2019-08-31 LAB — HEPATIC FUNCTION PANEL
ALT: 17 U/L (ref 0–53)
AST: 21 U/L (ref 0–37)
Albumin: 4.7 g/dL (ref 3.5–5.2)
Alkaline Phosphatase: 82 U/L (ref 39–117)
Bilirubin, Direct: 0.1 mg/dL (ref 0.0–0.3)
Total Bilirubin: 0.4 mg/dL (ref 0.2–1.2)
Total Protein: 7.5 g/dL (ref 6.0–8.3)

## 2019-08-31 LAB — RENAL FUNCTION PANEL
Albumin: 4.7 g/dL (ref 3.5–5.2)
BUN: 28 mg/dL — ABNORMAL HIGH (ref 6–23)
CO2: 30 mEq/L (ref 19–32)
Calcium: 10 mg/dL (ref 8.4–10.5)
Chloride: 103 mEq/L (ref 96–112)
Creatinine, Ser: 1.29 mg/dL (ref 0.40–1.50)
GFR: 53.76 mL/min — ABNORMAL LOW (ref >60.00)
Glucose, Bld: 116 mg/dL — ABNORMAL HIGH (ref 70–99)
Phosphorus: 3.1 mg/dL (ref 2.3–4.6)
Potassium: 4.5 mEq/L (ref 3.5–5.1)
Sodium: 140 mEq/L (ref 135–145)

## 2019-08-31 LAB — CBC
HCT: 43.3 % (ref 39.0–52.0)
Hemoglobin: 14.7 g/dL (ref 13.0–17.0)
MCHC: 34 g/dL (ref 30.0–36.0)
MCV: 93.1 fl (ref 78.0–100.0)
Platelets: 278 10*3/uL (ref 150.0–400.0)
RBC: 4.65 Mil/uL (ref 4.22–5.81)
RDW: 13.4 % (ref 11.5–15.5)
WBC: 5.5 10*3/uL (ref 4.0–10.5)

## 2019-08-31 LAB — LIPID PANEL
Cholesterol: 156 mg/dL (ref 0–200)
HDL: 47.5 mg/dL (ref >39.00)
LDL Cholesterol: 91 mg/dL (ref 0–99)
NonHDL: 108.74
Total CHOL/HDL Ratio: 3
Triglycerides: 90 mg/dL (ref 0.0–149.0)
VLDL: 18 mg/dL (ref 0.0–40.0)

## 2019-08-31 NOTE — Assessment & Plan Note (Signed)
I have personally reviewed the Medicare Annual Wellness questionnaire and have noted 1. The patient's medical and social history 2. Their use of alcohol, tobacco or illicit drugs 3. Their current medications and supplements 4. The patient's functional ability including ADL's, fall risks, home safety risks and hearing or visual             impairment. 5. Diet and physical activities 6. Evidence for depression or mood disorders  The patients weight, height, BMI and visual acuity have been recorded in the chart I have made referrals, counseling and provided education to the patient based review of the above and I have provided the pt with a written personalized care plan for preventive services.  I have provided you with a copy of your personalized plan for preventive services. Please take the time to review along with your updated medication list.  Due for second COVID soon No more PSA due to age Will likely have one more colon next year Discussed fitness Flu vaccine in the fall

## 2019-08-31 NOTE — Assessment & Plan Note (Signed)
BP Readings from Last 3 Encounters:  08/31/19 140/72  05/17/19 (!) 148/70  04/07/19 129/63   Reasonable control on ARB/HCTZ/amlodipine

## 2019-08-31 NOTE — Assessment & Plan Note (Signed)
Seen on echo but mild Probably no specific follow up Is on ASA and statin

## 2019-08-31 NOTE — Progress Notes (Signed)
Subjective:    Patient ID: Patrick [name], male    DOB: [birth year], 79 y.o.   MRN: [id number]  HPI Here for Medicare wellness visit and follow up of chronic health conditions This visit occurred during the SARS-CoV-2 public health emergency.  Safety protocols were in place, including screening questions prior to the visit, additional usage of staff PPE, and extensive cleaning of exam room while observing appropriate contact time as indicated for disinfecting solutions.   Reviewed form and advanced directives Reviewed other doctors Inconsistent with exercise--but walks 3 miles when he does Vision is okay--but needs reading glasses more Hearing not great--not ready for hearing aides. Has ringing regularly Enjoys red wine--- 3-4 per night (with supper) No tobacco No falls No depression or anhedonia---other than usual pandemic, etc Independent with instrumental ADLs Ongoing mild memory issues  Working more lately Driving [id number] miles yesterday (various loads) He still likes it  Having some numbness in his hands when he awakens Better if he moves them around Not sure which fingers Discussed wrist splint if worsens  Reviewed hospital stay and cath last June No change in past blockages Did have EGD by Dr Magog---no clear diagnosis Remains on the PPI Careful not to eat late  No recent chest pain No dizziness or syncope No palpitations No edema No recent echo ---had ectasia of aorta on the last one  Has urinary frequency Variable nocturia  1-2/night Flow seems okay  Last GFR 50 On ARB  Current Outpatient Medications on File Prior to Visit  Medication Sig Dispense Refill  . amLODipine (NORVASC) 5 MG tablet TAKE 1 TABLET BY MOUTH DAILY 90 tablet 3  . aspirin 81 MG tablet Take 81 mg by mouth daily.      . cetirizine (ZYRTEC) 10 MG tablet Take 10 mg by mouth daily as needed for allergies.    . Coenzyme Q10 (CO Q-10) 100 MG CAPS Take 1 capsule by mouth daily.     . fish  oil-omega-3 fatty acids 1000 MG capsule Take 1 g by mouth daily.     Marland Kitchen ketoconazole (NIZORAL) 2 % shampoo APPLY 1 APPLICATION TOPICALLY 2 (TWO) TIMES A WEEK. 120 mL 5  . latanoprost (XALATAN) 0.005 % ophthalmic solution Place 1 drop into both eyes at bedtime.     . nitroGLYCERIN (NITROSTAT) 0.4 MG SL tablet Place 1 tablet (0.4 mg total) under the tongue every 5 (five) minutes x 3 doses as needed for chest pain. 25 tablet 6  . olmesartan-hydrochlorothiazide (BENICAR HCT) 40-25 MG tablet Take 1 tablet by mouth daily.    Marland Kitchen omeprazole (PRILOSEC) 40 MG capsule Take 1 capsule by mouth daily.    . rosuvastatin (CRESTOR) 20 MG tablet TAKE 1/2 TABLET BY MOUTH ONCE DAILY 45 tablet 2  . triamcinolone cream (KENALOG) 0.1 % Apply 1 application topically as needed.   0   No current facility-administered medications on file prior to visit.    Allergies  Allergen Reactions  . Atorvastatin Other (See Comments)    MYALGIAS, LEG CRAMPING  . Ezetimibe-Simvastatin Other (See Comments)    MYALGIAS, CRAMPING  . Lisinopril Cough  . Pravastatin Other (See Comments)    Myalgias at 40mg  dosage    Past Medical History:  Diagnosis Date  . Allergy   . Aortic root dilatation (HCC)    Mild by echo 11/2011 (77mm)  . BPH (benign prostatic hypertrophy)   . Bradycardia    Precluding BB use.  Marland Kitchen CAD (coronary artery disease)    a.  s/p Taxus DES to RCA and PDA in 2006 .  // b. Myoview 8/11: Walked 12:37. EF 57%. no ischemia or scar.  // c. Mod CAD by cath 11/2011 (40-50% ISR of PDA -- for OP myoview to assess).  //  d. Myoview 3/18: EF 58, no ischemia; Low Risk  . Colon polyps   . Complication of anesthesia    "they said they about lost, me when I had eye surgery"  . Diverticulitis   . GERD (gastroesophageal reflux disease)   . Glaucoma   . Hyperlipidemia   . Hypertension   . Impaired fasting glucose   . Sleep apnea   . Syncope     Past Surgical History:  Procedure Laterality Date  . ANGIOPLASTY      percutaneous transluminal coronary  angioplasty and stenting of the right coronary artery and posterior   . CATARACT EXTRACTION W/ INTRAOCULAR LENS  IMPLANT, BILATERAL  9/15  . COLONOSCOPY    . INTRAOCULAR LENS INSERTION    . LEFT HEART CATH AND CORONARY ANGIOGRAPHY N/A 10/14/2018   Procedure: LEFT HEART CATH AND CORONARY ANGIOGRAPHY;  Surgeon: Martinique, Peter M, MD;  Location: [facility];  Service: Cardiovascular;  Laterality: N/A;  . LEFT HEART CATHETERIZATION WITH CORONARY ANGIOGRAM N/A 11/26/2011   Procedure: LEFT HEART CATHETERIZATION WITH CORONARY ANGIOGRAM;  Surgeon: Thayer Headings, MD;  Location: [facility] CATH [facility];  Service: Cardiovascular;  Laterality: N/A;  . LUMBAR LAMINECTOMY/DECOMPRESSION MICRODISCECTOMY Left 05/27/2016   Procedure: Laminectomy for facet/synovial cyst - Lumbar four - Lumbar five - left;  Surgeon: Kary Kos, MD;  Location: [facility];  Service: Neurosurgery;  Laterality: Left;  Laminectomy for facet/synovial cyst - Lumbar four - Lumbar five - left  . PILONIDAL CYST / SINUS EXCISION      Family History  Problem Relation Age of Onset  . Coronary artery disease Father   . Heart disease Father   . Heart failure Father   . Cancer Mother        ovarian cancer  . Stroke Son   . Hypertension Neg Hx   . Diabetes Neg Hx     Social History   Socioeconomic History  . Marital status: Divorced    Spouse name: Not on file  . Number of children: 3  . Years of education: Not on file  . Highest education level: Not on file  Occupational History  . Occupation: driver part Geophysicist/field seismologist: retired  Tobacco Use  . Smoking status: Former Smoker    Years: 30.00    Quit date: 05/13/1978    Years since quitting: 41.3  . Smokeless tobacco: Never Used  Substance and Sexual Activity  . Alcohol use: Yes    Alcohol/week: 14.0 standard drinks    Types: 14 Glasses of wine per week  . Drug use: No  . Sexual activity: Yes  Other Topics Concern  . Not on file  Social History  Narrative   Has living will   Daughter Benjamine Mola should make medical decisions if he can't   Would accept resuscitation   Wouldn't want prolonged tube feeds if cognitively unaware   Social Determinants of Health   Financial Resource Strain:   . Difficulty of Paying Living Expenses:   Food Insecurity:   . Worried About Charity fundraiser in the Last Year:   . Arboriculturist in the Last Year:   Transportation Needs:   . Film/video editor (Medical):   Marland Kitchen Lack of  Transportation (Non-Medical):   Physical Activity:   . Days of Exercise per Week:   . Minutes of Exercise per Session:   Stress:   . Feeling of Stress :   Social Connections:   . Frequency of Communication with Friends and Family:   . Frequency of Social Gatherings with Friends and Family:   . Attends Religious Services:   . Active Member of Clubs or Organizations:   . Attends Archivist Meetings:   Marland Kitchen Marital Status:   Intimate Partner Violence:   . Fear of Current or Ex-Partner:   . Emotionally Abused:   Marland Kitchen Physically Abused:   . Sexually Abused:    Review of Systems Appetite is fine Weight down some from last year Wears seat belt Teeth okay---regular with dentist (new one since his retired) No worrisome skin lesions now Bowels are fine. No blood Some low back pain. Arthritis in fingers at times (advise tylenol prn)     Objective:   Physical Exam  Constitutional: He is oriented to person, place, and time. He appears well-developed. No distress.  HENT:  Mouth/Throat: Oropharynx is clear and moist.  No oral lesions  Neck: No thyromegaly present.  Cardiovascular: Normal rate, regular rhythm, normal heart sounds and intact distal pulses. Exam reveals no gallop.  No murmur heard. Respiratory: Effort normal and breath sounds normal. No respiratory distress. He has no wheezes. He has no rales.  GI: Soft. There is no abdominal tenderness.  Musculoskeletal:        General: No tenderness or edema.    Lymphadenopathy:    He has no cervical adenopathy.  Neurological: He is alert and oriented to person, place, and time.  President--- "Zoila Shutter, Bush---black dude" [id number] Recall 3/3  Skin: No rash noted. No erythema.  Psychiatric: He has a normal mood and affect. His behavior is normal.           Assessment & Plan:

## 2019-08-31 NOTE — Assessment & Plan Note (Signed)
No angina On statin, ASA, ARB

## 2019-08-31 NOTE — Assessment & Plan Note (Signed)
See social history 

## 2019-08-31 NOTE — Assessment & Plan Note (Signed)
Not bad enough for meds yet

## 2019-08-31 NOTE — Progress Notes (Signed)
Hearing Screening   Method: Audiometry   125Hz  250Hz  500Hz  1000Hz  2000Hz  3000Hz  4000Hz  6000Hz  8000Hz   Right ear:   40 40 40  0    Left ear:   40 40 40  0    Vision Screening Comments: April 2021

## 2019-08-31 NOTE — Assessment & Plan Note (Signed)
Mild  On ARB Will recheck 

## 2019-08-31 NOTE — Assessment & Plan Note (Signed)
Doing okay on PPI

## 2019-09-15 ENCOUNTER — Ambulatory Visit: Payer: Medicare Other | Attending: Internal Medicine

## 2019-09-15 DIAGNOSIS — Z23 Encounter for immunization: Secondary | ICD-10-CM

## 2019-09-15 NOTE — Progress Notes (Signed)
   Covid-19 Vaccination Clinic  Name:  Patrick [name]    MRN: [id number] DOB: 25-Apr-1941  09/15/2019  Mr. Patrick [name] was observed post Covid-19 immunization for 15 minutes without incident. He was provided with Vaccine Information Sheet and instruction to access the V-Safe system.   Mr. Patrick [name] was instructed to call 911 with any severe reactions post vaccine: Marland Kitchen Difficulty breathing  . Swelling of face and throat  . A fast heartbeat  . A bad rash all over body  . Dizziness and weakness   Immunizations Administered    Name Date Dose VIS Date Route   Pfizer COVID-19 Vaccine 09/15/2019  8:08 AM 0.3 mL 07/07/2018 Intramuscular   Manufacturer: [employer], [employer]   Lot: [id number]   [place]: [id number]

## 2019-10-06 ENCOUNTER — Other Ambulatory Visit: Payer: Self-pay | Admitting: Internal Medicine

## 2019-10-09 DIAGNOSIS — Z03818 Encounter for observation for suspected exposure to other biological agents ruled out: Secondary | ICD-10-CM | POA: Diagnosis not present

## 2019-10-17 ENCOUNTER — Other Ambulatory Visit: Payer: Self-pay | Admitting: Cardiology

## 2019-11-10 ENCOUNTER — Other Ambulatory Visit: Payer: Self-pay | Admitting: Cardiovascular Disease

## 2019-11-22 DIAGNOSIS — L570 Actinic keratosis: Secondary | ICD-10-CM | POA: Diagnosis not present

## 2019-11-22 DIAGNOSIS — L821 Other seborrheic keratosis: Secondary | ICD-10-CM | POA: Diagnosis not present

## 2019-11-22 DIAGNOSIS — L218 Other seborrheic dermatitis: Secondary | ICD-10-CM | POA: Diagnosis not present

## 2019-11-22 DIAGNOSIS — D1801 Hemangioma of skin and subcutaneous tissue: Secondary | ICD-10-CM | POA: Diagnosis not present

## 2019-11-22 DIAGNOSIS — D225 Melanocytic nevi of trunk: Secondary | ICD-10-CM | POA: Diagnosis not present

## 2019-11-22 DIAGNOSIS — L814 Other melanin hyperpigmentation: Secondary | ICD-10-CM | POA: Diagnosis not present

## 2020-01-11 DIAGNOSIS — H401132 Primary open-angle glaucoma, bilateral, moderate stage: Secondary | ICD-10-CM | POA: Diagnosis not present

## 2020-01-11 DIAGNOSIS — H532 Diplopia: Secondary | ICD-10-CM | POA: Diagnosis not present

## 2020-03-20 DIAGNOSIS — Z961 Presence of intraocular lens: Secondary | ICD-10-CM | POA: Diagnosis not present

## 2020-04-03 ENCOUNTER — Other Ambulatory Visit: Payer: Self-pay | Admitting: Cardiovascular Disease

## 2020-04-03 NOTE — Telephone Encounter (Signed)
rx refill

## 2020-04-30 ENCOUNTER — Other Ambulatory Visit: Payer: Self-pay | Admitting: Cardiovascular Disease

## 2020-05-14 ENCOUNTER — Encounter: Payer: Self-pay | Admitting: Cardiovascular Disease

## 2020-05-14 NOTE — Progress Notes (Signed)
Patrick [name] Date of Birth  Nov 14, 1940       [name], [place]   [place], [place]  [address]    [phone]  Problem List: 1. Coronary artery disease-moderate.  He's had a previous stent placed. 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 A subsequent Myoview study was normal. 2. Hypertension 3. Hyperlipidemia 4. Sleep apnea  Mr. Patrick [name] is doing very will.   He has been on a mission trip and to [place] [place].  No further episodes of chest pain.   He hauled 5 loads of hogs last week - his brother raises the hogs.  He is planning on going to [place] over Christmas.  He still does his lawn care business.  He walks at the [employer] park - 3 miles a day.  Without symptoms.  November 09, 2012:  Patrick [name] is doing well.  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   He is a retired Administrator.   Still walking 3 days a week.    Oct. 9, 2015:  Still very active - mows 8-9 yards a week.  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  No angina. BP is high. Has had a cold - is taking chlorpheneramine  Has had normal Bp at home   Sep 19, 2014:   Still very active.  Drives a truck.  Mows and trims 10 yards a week without any problems.  Has atypical  chest pain with mental stress but never with physical exertion.  Does not restrict his diet.   Nov. 18, 2016  Still working hard ( driving a truck and mowing yards)  Gets more short of breath compared to several years ago  Still eats some salty foods.   Has a lady who is cooking for him  - knows that she puts lots of salt in the food  October 16, 2015:  [name] is seen back for his CAD, HTN, HLD . OSA  Just got back from a cruise in [place] .  Still very active.   Mows yards, still drives trucks.     Dec. 4, 2017:  [name] is seen today with [facility] Still drives his truck and mows regularly .   No real CP or dyspnea.   Very rare episodes of chest tightness.  Keeps his BP at  home.  Seems to go up when he comes to the doctor .   October 31, 2016:  Patrick [name] is seen today for follow up  Had back surgery in January .   Back pain has returned . Still very busy,   Mows frequently .   Has a [employer] that he loves but seems to contribute to his back pain.   No angnia  BP at home has been mildly elevated.   Admits that he eats more salt than he should .   Jan. 15, 2019:   BP is a little elevate.  No dyspnea. , no CP  Eating a bit of extra salt.  Has not been exercising .   January 26, 2018: Doing well BP at home has been good . Keeps a BP log. BP is a bit high today  - ate some hotdogs this weekend .  No CP . Exercising some .   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 and [place]  Did  lots of walking in [place]   May 17, 2019:  What is seen today for follow-up of his mild coronary artery disease, hypertension, hyperlipidemia. 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 Always a bit high when he comes in to the office    Jan. 3, 2022 Patrick [name] is seen today for follow up of his CAD , HTN, HLD No cp  Still working very hard.  Cleaned out 8 hen houses over the past week or so .  Walks 3 miles a day several times a week .  Walks at [employer] [facility] )    Current Outpatient Medications on File Prior to Visit  Medication Sig Dispense Refill  . amLODipine (NORVASC) 5 MG tablet TAKE 1 TABLET BY MOUTH EVERY DAY 90 tablet 3  . aspirin 81 MG tablet Take 81 mg by mouth daily.    . cetirizine (ZYRTEC) 10 MG tablet Take 10 mg by mouth daily as needed for allergies.    . Coenzyme Q10 (CO Q-10) 100 MG CAPS Take 1 capsule by mouth daily.    . fish oil-omega-3 fatty acids 1000 MG capsule Take 1 g by mouth daily.    Patrick [name] ketoconazole (NIZORAL) 2 % shampoo APPLY 1 APPLICATION TOPICALLY 2 (TWO) TIMES A WEEK. 120 mL 5  . latanoprost (XALATAN) 0.005 % ophthalmic solution Place 1  drop into both eyes at bedtime.     . nitroGLYCERIN (NITROSTAT) 0.4 MG SL tablet Place 1 tablet (0.4 mg total) under the tongue every 5 (five) minutes x 3 doses as needed for chest pain. 25 tablet 6  . olmesartan-hydrochlorothiazide (BENICAR HCT) 40-25 MG tablet TAKE 1 TABLET BY MOUTH EVERY DAY 90 tablet 1  . omeprazole (PRILOSEC) 40 MG capsule Take 1 capsule by mouth daily.    . rosuvastatin (CRESTOR) 20 MG tablet Take 0.5 tablets (10 mg total) by mouth daily. Please keep upcoming appt in January 2022 with Dr. Elease [name] before anymore refills. Thank you 45 tablet 0  . triamcinolone cream (KENALOG) 0.1 % Apply 1 application topically as needed.   0   No current facility-administered medications on file prior to visit.    Allergies  Allergen Reactions  . Atorvastatin Other (See Comments)    MYALGIAS, LEG CRAMPING  . Ezetimibe-Simvastatin Other (See Comments)    MYALGIAS, CRAMPING  . Lisinopril Cough  . Pravastatin Other (See Comments)    Myalgias at 40mg  dosage    Past Medical History:  Diagnosis Date  . Allergy   . Aortic root dilatation (HCC)    Mild by echo 11/2011 (74mm)  . BPH (benign prostatic hypertrophy)   . Bradycardia    Precluding BB use.  25m CAD (coronary artery disease)    a. s/p Taxus DES to RCA and PDA in 2006 .  // b. Myoview 8/11: Walked 12:37. EF 57%. no ischemia or scar.  // c. Mod CAD by cath 11/2011 (40-50% ISR of PDA -- for OP myoview to assess).  //  d. Myoview 3/18: EF 58, no ischemia; Low Risk  . Colon polyps   . Complication of anesthesia    "they said they about lost, me when I had eye surgery"  . Diverticulitis   . GERD (gastroesophageal reflux disease)   . Glaucoma   . Hyperlipidemia   . Hypertension   . Impaired fasting glucose   . Sleep apnea   . Syncope     Past Surgical History:  Procedure  Laterality Date  . ANGIOPLASTY     percutaneous transluminal coronary  angioplasty and stenting of the right coronary artery and posterior   . CATARACT  EXTRACTION W/ INTRAOCULAR LENS  IMPLANT, BILATERAL  9/15  . COLONOSCOPY    . INTRAOCULAR LENS INSERTION    . LEFT HEART CATH AND CORONARY ANGIOGRAPHY N/A 10/14/2018   Procedure: LEFT HEART CATH AND CORONARY ANGIOGRAPHY;  Surgeon: Martinique, Peter M, MD;  Location: [facility] [place] [facility];  Service: Cardiovascular;  Laterality: N/A;  . LEFT HEART CATHETERIZATION WITH CORONARY ANGIOGRAM N/A 11/26/2011   Procedure: LEFT HEART CATHETERIZATION WITH CORONARY ANGIOGRAM;  Surgeon: Thayer Headings, MD;  Location: [facility] CATH LAB;  Service: Cardiovascular;  Laterality: N/A;  . LUMBAR LAMINECTOMY/DECOMPRESSION MICRODISCECTOMY Left 05/27/2016   Procedure: Laminectomy for facet/synovial cyst - Lumbar four - Lumbar five - left;  Surgeon: Kary Kos, MD;  Location: [facility];  Service: Neurosurgery;  Laterality: Left;  Laminectomy for facet/synovial cyst - Lumbar four - Lumbar five - left  . PILONIDAL CYST / SINUS EXCISION      Social History   Tobacco Use  Smoking Status Former Smoker  . Years: 30.00  . Quit date: 05/13/1978  . Years since quitting: 42.0  Smokeless Tobacco Never Used    Social History   Substance and Sexual Activity  Alcohol Use Yes  . Alcohol/week: 14.0 standard drinks  . Types: 14 Glasses of wine per week    Family History  Problem Relation Age of Onset  . Coronary artery disease Father   . Heart disease Father   . Heart failure Father   . Cancer Mother        ovarian cancer  . Stroke Son   . Hypertension Neg Hx   . Diabetes Neg Hx     Reviw of Systems:  Noted in current history, otherwise systems are negative.  Physical Exam: Blood pressure 132/66, pulse (!) 56, height 5' 6.5" (1.689 m), weight 193 lb 6.4 oz (87.7 kg), SpO2 96 %.  GEN:  Well nourished, well developed in no acute distress HEENT: Normal NECK: No JVD; No carotid bruits LYMPHATICS: No lymphadenopathy CARDIAC: RRR , no murmurs, rubs, gallops RESPIRATORY:  Clear to auscultation without rales, wheezing or rhonchi   ABDOMEN: Soft, non-tender, non-distended MUSCULOSKELETAL:  No edema; No deformity  SKIN: Warm and dry NEUROLOGIC:  Alert and oriented x 3    ECG: May 15, 2020: Sinus bradycardia 56 beats minute.  No ST or T wave changes.    Assessment / Plan:   1. Coronary artery disease-moderate.  Seems to be doing well.  Not having any episodes of angina.  Continue exercise.  Continue current medications.   2. Hypertension-    blood pressure is well controlled.  3.  Hyperlipidemia: Managed by Dr. Silvio Pate.   4. Sleep apnea-      Mertie Moores, MD  05/15/2020 3:02 PM    [facility] [place],  [place], [place]  [address] Pager [phone] Phone: [phone]; Fax: [phone]

## 2020-05-15 ENCOUNTER — Ambulatory Visit: Payer: Medicare Other | Admitting: Cardiovascular Disease

## 2020-05-15 ENCOUNTER — Encounter: Payer: Self-pay | Admitting: Cardiovascular Disease

## 2020-05-15 ENCOUNTER — Other Ambulatory Visit: Payer: Self-pay

## 2020-05-15 VITALS — BP 132/66 | HR 56 | Ht 66.5 in | Wt 193.4 lb

## 2020-05-15 DIAGNOSIS — G4730 Sleep apnea, unspecified: Secondary | ICD-10-CM | POA: Diagnosis not present

## 2020-05-15 DIAGNOSIS — I10 Essential (primary) hypertension: Secondary | ICD-10-CM | POA: Diagnosis not present

## 2020-05-15 DIAGNOSIS — E782 Mixed hyperlipidemia: Secondary | ICD-10-CM | POA: Diagnosis not present

## 2020-05-15 NOTE — Patient Instructions (Signed)
Medication Instructions:  Your physician recommends that you continue on your current medications as directed. Please refer to the Current Medication list given to you today.  *If you need a refill on your cardiac medications before your next appointment, please call your pharmacy*   Lab Work: None ordered.  If you have labs (blood work) drawn today and your tests are completely normal, you will receive your results only by: . MyChart Message (if you have MyChart) OR . A paper copy in the mail If you have any lab test that is abnormal or we need to change your treatment, we will call you to review the results.   Testing/Procedures: None ordered.    Follow-Up: At [facility], you and your health needs are our priority.  As part of our continuing mission to provide you with exceptional heart care, we have created designated Provider Care Teams.  These Care Teams include your primary Cardiologist (physician) and Advanced Practice Providers (APPs -  Physician Assistants and Nurse Practitioners) who all work together to provide you with the care you need, when you need it.  We recommend signing up for the patient portal called "MyChart".  Sign up information is provided on this After Visit Summary.  MyChart is used to connect with patients for Virtual Visits (Telemedicine).  Patients are able to view lab/test results, encounter notes, upcoming appointments, etc.  Non-urgent messages can be sent to your provider as well.   To learn more about what you can do with MyChart, go to [email or web address].    Your next appointment:   12 month(s)  The format for your next appointment:   In Person  Provider:   Philip Nahser, MD     

## 2020-05-17 DIAGNOSIS — H43813 Vitreous degeneration, bilateral: Secondary | ICD-10-CM | POA: Diagnosis not present

## 2020-05-17 DIAGNOSIS — H401132 Primary open-angle glaucoma, bilateral, moderate stage: Secondary | ICD-10-CM | POA: Diagnosis not present

## 2020-05-17 DIAGNOSIS — H0100A Unspecified blepharitis right eye, upper and lower eyelids: Secondary | ICD-10-CM | POA: Diagnosis not present

## 2020-05-17 DIAGNOSIS — H353112 Nonexudative age-related macular degeneration, right eye, intermediate dry stage: Secondary | ICD-10-CM | POA: Diagnosis not present

## 2020-06-06 DIAGNOSIS — Z23 Encounter for immunization: Secondary | ICD-10-CM | POA: Diagnosis not present

## 2020-06-12 ENCOUNTER — Other Ambulatory Visit: Payer: Self-pay | Admitting: Cardiovascular Disease

## 2020-07-11 ENCOUNTER — Telehealth: Payer: Self-pay | Admitting: Internal Medicine

## 2020-07-11 NOTE — Telephone Encounter (Signed)
Pt called in he received a message about consenting to the prescription he doesn't remember but after I told him he did say he neeed the shampoo

## 2020-07-11 NOTE — Telephone Encounter (Signed)
I have already done the refill this morning at the request of the pharmacy.

## 2020-08-30 ENCOUNTER — Ambulatory Visit: Payer: Medicare Other

## 2020-09-01 ENCOUNTER — Ambulatory Visit (INDEPENDENT_AMBULATORY_CARE_PROVIDER_SITE_OTHER): Payer: Medicare Other | Admitting: Internal Medicine

## 2020-09-01 ENCOUNTER — Other Ambulatory Visit: Payer: Self-pay

## 2020-09-01 ENCOUNTER — Encounter: Payer: Self-pay | Admitting: Internal Medicine

## 2020-09-01 VITALS — BP 112/78 | HR 60 | Temp 97.9°F | Ht 67.5 in | Wt 189.0 lb

## 2020-09-01 DIAGNOSIS — N400 Enlarged prostate without lower urinary tract symptoms: Secondary | ICD-10-CM

## 2020-09-01 DIAGNOSIS — Z7189 Other specified counseling: Secondary | ICD-10-CM | POA: Diagnosis not present

## 2020-09-01 DIAGNOSIS — I10 Essential (primary) hypertension: Secondary | ICD-10-CM | POA: Diagnosis not present

## 2020-09-01 DIAGNOSIS — R7301 Impaired fasting glucose: Secondary | ICD-10-CM | POA: Diagnosis not present

## 2020-09-01 DIAGNOSIS — I77810 Thoracic aortic ectasia: Secondary | ICD-10-CM

## 2020-09-01 DIAGNOSIS — Z0000 Encounter for general adult medical examination without abnormal findings: Secondary | ICD-10-CM

## 2020-09-01 DIAGNOSIS — N1831 Chronic kidney disease, stage 3a: Secondary | ICD-10-CM | POA: Diagnosis not present

## 2020-09-01 DIAGNOSIS — I2510 Atherosclerotic heart disease of native coronary artery without angina pectoris: Secondary | ICD-10-CM

## 2020-09-01 LAB — LIPID PANEL
Cholesterol: 147 mg/dL (ref 0–200)
HDL: 46.8 mg/dL (ref >39.00)
LDL Cholesterol: 70 mg/dL (ref 0–99)
NonHDL: 100.2
Total CHOL/HDL Ratio: 3
Triglycerides: 153 mg/dL — ABNORMAL HIGH (ref 0.0–149.0)
VLDL: 30.6 mg/dL (ref 0.0–40.0)

## 2020-09-01 LAB — HEMOGLOBIN A1C: Hgb A1c MFr Bld: 6.1 % (ref 4.6–6.5)

## 2020-09-01 LAB — CBC
HCT: 43.8 % (ref 39.0–52.0)
Hemoglobin: 14.7 g/dL (ref 13.0–17.0)
MCHC: 33.6 g/dL (ref 30.0–36.0)
MCV: 92.6 fl (ref 78.0–100.0)
Platelets: 263 10*3/uL (ref 150.0–400.0)
RBC: 4.72 Mil/uL (ref 4.22–5.81)
RDW: 13.7 % (ref 11.5–15.5)
WBC: 9.5 10*3/uL (ref 4.0–10.5)

## 2020-09-01 LAB — RENAL FUNCTION PANEL
Albumin: 4.4 g/dL (ref 3.5–5.2)
BUN: 28 mg/dL — ABNORMAL HIGH (ref 6–23)
CO2: 29 mEq/L (ref 19–32)
Calcium: 9.6 mg/dL (ref 8.4–10.5)
Chloride: 102 mEq/L (ref 96–112)
Creatinine, Ser: 1.17 mg/dL (ref 0.40–1.50)
GFR: 59.17 mL/min — ABNORMAL LOW (ref >60.00)
Glucose, Bld: 101 mg/dL — ABNORMAL HIGH (ref 70–99)
Phosphorus: 2.7 mg/dL (ref 2.3–4.6)
Potassium: 4.2 mEq/L (ref 3.5–5.1)
Sodium: 140 mEq/L (ref 135–145)

## 2020-09-01 LAB — HEPATIC FUNCTION PANEL
ALT: 20 U/L (ref 0–53)
AST: 18 U/L (ref 0–37)
Albumin: 4.4 g/dL (ref 3.5–5.2)
Alkaline Phosphatase: 80 U/L (ref 39–117)
Bilirubin, Direct: 0.1 mg/dL (ref 0.0–0.3)
Total Bilirubin: 0.6 mg/dL (ref 0.2–1.2)
Total Protein: 7.4 g/dL (ref 6.0–8.3)

## 2020-09-01 NOTE — Assessment & Plan Note (Signed)
Will check A1c

## 2020-09-01 NOTE — Progress Notes (Signed)
Hearing Screening   125Hz  250Hz  500Hz  1000Hz  2000Hz  3000Hz  4000Hz  6000Hz  8000Hz   Right ear:   25 25 25   0    Left ear:   25 25 25   0    Vision Screening Comments: November 2021

## 2020-09-01 NOTE — Assessment & Plan Note (Signed)
Continues on olmesartan

## 2020-09-01 NOTE — Progress Notes (Signed)
Subjective:    Patient ID: Patrick [name], male    DOB: 1940/10/01, 80 y.o.   MRN: [id number]  HPI Here for Medicare wellness visit and follow up of chronic medical conditions This visit occurred during the SARS-CoV-2 public health emergency.  Safety protocols were in place, including screening questions prior to the visit, additional usage of staff PPE, and extensive cleaning of exam room while observing appropriate contact time as indicated for disinfecting solutions.   Reviewed form and advanced directives Reviewed other doctors Still working a lot---does walk daily when he goes to the beach Still enjoys 3-4 glasses of red wine with dinner No tobacco Vision is stable--Rx for glaucoma Hearing isn't great---doesn't want hearing aide No falls No depression or anhedonia Independent with instrumental ADLs Mild memory issues  No problems with heart No chest pain or SOB Exercise tolerance is about the same Known dilated aortic root No edema No palpitations  Known CKD 3---last GFR 53  Continues on the 40mg  omeprazole He had terrible heartburn when he stopped it Still gets regurgitation if he eats late and then lies down No dysphagia  Current Outpatient Medications on File Prior to Visit  Medication Sig Dispense Refill  . amLODipine (NORVASC) 5 MG tablet TAKE 1 TABLET BY MOUTH EVERY DAY 90 tablet 3  . aspirin 81 MG tablet Take 81 mg by mouth daily.    . cetirizine (ZYRTEC) 10 MG tablet Take 10 mg by mouth daily as needed for allergies.    . Coenzyme Q10 (CO Q-10) 100 MG CAPS Take 1 capsule by mouth daily.    . fish oil-omega-3 fatty acids 1000 MG capsule Take 1 g by mouth daily.    Marland Kitchen ketoconazole (NIZORAL) 2 % shampoo APPLY 1 APPLICATION TOPICALLY 2 (TWO) TIMES A WEEK. 120 mL 5  . latanoprost (XALATAN) 0.005 % ophthalmic solution Place 1 drop into both eyes at bedtime.     Marland Kitchen olmesartan-hydrochlorothiazide (BENICAR HCT) 40-25 MG tablet TAKE 1 TABLET BY MOUTH EVERY DAY 90 tablet 1   . omeprazole (PRILOSEC) 40 MG capsule Take 1 capsule by mouth daily.    . rosuvastatin (CRESTOR) 20 MG tablet TAKE 1/2 TABLET BY MOUTH DAILY 45 tablet 3  . triamcinolone cream (KENALOG) 0.1 % Apply 1 application topically as needed.   0  . nitroGLYCERIN (NITROSTAT) 0.4 MG SL tablet Place 1 tablet (0.4 mg total) under the tongue every 5 (five) minutes x 3 doses as needed for chest pain. 25 tablet 6   No current facility-administered medications on file prior to visit.    Allergies  Allergen Reactions  . Atorvastatin Other (See Comments)    MYALGIAS, LEG CRAMPING  . Ezetimibe-Simvastatin Other (See Comments)    MYALGIAS, CRAMPING  . Lisinopril Cough  . Pravastatin Other (See Comments)    Myalgias at 40mg  dosage    Past Medical History:  Diagnosis Date  . Allergy   . Aortic root dilatation (HCC)    Mild by echo 11/2011 (46mm)  . BPH (benign prostatic hypertrophy)   . Bradycardia    Precluding BB use.  Marland Kitchen CAD (coronary artery disease)    a. s/p Taxus DES to RCA and PDA in 2006 .  // b. Myoview 8/11: Walked 12:37. EF 57%. no ischemia or scar.  // c. Mod CAD by cath 11/2011 (40-50% ISR of PDA -- for OP myoview to assess).  //  d. Myoview 3/18: EF 58, no ischemia; Low Risk  . Colon polyps   . Complication of  anesthesia    "they said they about lost, me when I had eye surgery"  . Diverticulitis   . GERD (gastroesophageal reflux disease)   . Glaucoma   . Hyperlipidemia   . Hypertension   . Impaired fasting glucose   . Sleep apnea   . Syncope     Past Surgical History:  Procedure Laterality Date  . ANGIOPLASTY     percutaneous transluminal coronary  angioplasty and stenting of the right coronary artery and posterior   . CATARACT EXTRACTION W/ INTRAOCULAR LENS  IMPLANT, BILATERAL  9/15  . COLONOSCOPY    . INTRAOCULAR LENS INSERTION    . LEFT HEART CATH AND CORONARY ANGIOGRAPHY N/A 10/14/2018   Procedure: LEFT HEART CATH AND CORONARY ANGIOGRAPHY;  Surgeon: Martinique, Peter M, MD;   Location: [place] [facility];  Service: Cardiovascular;  Laterality: N/A;  . LEFT HEART CATHETERIZATION WITH CORONARY ANGIOGRAM N/A 11/26/2011   Procedure: LEFT HEART CATHETERIZATION WITH CORONARY ANGIOGRAM;  Surgeon: Thayer Headings, MD;  Location: [facility] CATH LAB;  Service: Cardiovascular;  Laterality: N/A;  . LUMBAR LAMINECTOMY/DECOMPRESSION MICRODISCECTOMY Left 05/27/2016   Procedure: Laminectomy for facet/synovial cyst - Lumbar four - Lumbar five - left;  Surgeon: Kary Kos, MD;  Location: [facility];  Service: Neurosurgery;  Laterality: Left;  Laminectomy for facet/synovial cyst - Lumbar four - Lumbar five - left  . PILONIDAL CYST / SINUS EXCISION      Family History  Problem Relation Age of Onset  . Coronary artery disease Father   . Heart disease Father   . Heart failure Father   . Cancer Mother        ovarian cancer  . Stroke Son   . Hypertension Neg Hx   . Diabetes Neg Hx     Social History   Socioeconomic History  . Marital status: Divorced    Spouse name: Not on file  . Number of children: 3  . Years of education: Not on file  . Highest education level: Not on file  Occupational History  . Occupation: driver part Geophysicist/field seismologist: retired  Tobacco Use  . Smoking status: Former Smoker    Years: 30.00    Quit date: 05/13/1978    Years since quitting: 42.3  . Smokeless tobacco: Never Used  Vaping Use  . Vaping Use: Never used  Substance and Sexual Activity  . Alcohol use: Yes    Alcohol/week: 14.0 standard drinks    Types: 14 Glasses of wine per week  . Drug use: No  . Sexual activity: Yes  Other Topics Concern  . Not on file  Social History Narrative   Has living will   Daughter Benjamine Mola should make medical decisions if he can't   Would accept resuscitation   Wouldn't want prolonged tube feeds if cognitively unaware   Social Determinants of Health   Financial Resource Strain: Not on file  Food Insecurity: Not on file  Transportation Needs: Not on file  Physical  Activity: Not on file  Stress: Not on file  Social Connections: Not on file  Intimate Partner Violence: Not on file   Review of Systems  Appetite is good Weight is stable Sleeps well Wears seat belt Teeth are okay--keeps up with the dentist (did have recent extraction) Bowels move fine--no blood Voids okay---some frequency. Flow is fair--?incomplete emptying No sig back or joint pains No suspicious skin lesions     Objective:   Physical Exam Constitutional:      Appearance: Normal  appearance.  HENT:     Mouth/Throat:     Comments: No lesions Eyes:     Conjunctiva/sclera: Conjunctivae normal.     Pupils: Pupils are equal, round, and reactive to light.  Cardiovascular:     Rate and Rhythm: Normal rate and regular rhythm.     Pulses: Normal pulses.     Heart sounds: No murmur heard. No gallop.   Pulmonary:     Effort: Pulmonary effort is normal.     Breath sounds: Normal breath sounds. No wheezing or rales.  Abdominal:     Palpations: Abdomen is soft.     Tenderness: There is no abdominal tenderness.  Musculoskeletal:     Cervical back: Neck supple.     Right lower leg: No edema.     Left lower leg: No edema.  Lymphadenopathy:     Cervical: No cervical adenopathy.  Skin:    General: Skin is warm.     Findings: No rash.  Neurological:     Mental Status: He is alert and oriented to person, place, and time.     Comments: President---"Biden, Trump, Obama" [id number] Recall 3/3  Psychiatric:        Mood and Affect: Mood normal.        Behavior: Behavior normal.            Assessment & Plan:

## 2020-09-01 NOTE — Assessment & Plan Note (Signed)
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

## 2020-09-01 NOTE — Assessment & Plan Note (Signed)
Some symptoms--not ready for meds Will try tamsulosin if it worsens

## 2020-09-01 NOTE — Assessment & Plan Note (Signed)
No chest pain No statin, ASA, ARB

## 2020-09-01 NOTE — Assessment & Plan Note (Signed)
I have personally reviewed the Medicare Annual Wellness questionnaire and have noted 1. The patient's medical and social history 2. Their use of alcohol, tobacco or illicit drugs 3. Their current medications and supplements 4. The patient's functional ability including ADL's, fall risks, home safety risks and hearing or visual             impairment. 5. Diet and physical activities 6. Evidence for depression or mood disorders  The patients weight, height, BMI and visual acuity have been recorded in the chart I have made referrals, counseling and provided education to the patient based review of the above and I have provided the pt with a written personalized care plan for preventive services.  I have provided you with a copy of your personalized plan for preventive services. Please take the time to review along with your updated medication list.  No cancer screening anymore COVID booster and flu vaccine in the fall Discussed exercise

## 2020-09-01 NOTE — Assessment & Plan Note (Signed)
See social history 

## 2020-09-01 NOTE — Patient Instructions (Signed)
Try some 20mg  omeprazole (over the counter) for a couple of weeks. If your symptoms are controlled, let me know and I will change your prescription to this lower dose.

## 2020-09-01 NOTE — Assessment & Plan Note (Signed)
BP Readings from Last 3 Encounters:  09/01/20 112/78  05/15/20 132/66  08/31/19 140/72   Good control on olmesartan, HCTZ and amlodipine

## 2020-09-26 DIAGNOSIS — H0100A Unspecified blepharitis right eye, upper and lower eyelids: Secondary | ICD-10-CM | POA: Diagnosis not present

## 2020-09-26 DIAGNOSIS — H401132 Primary open-angle glaucoma, bilateral, moderate stage: Secondary | ICD-10-CM | POA: Diagnosis not present

## 2020-09-26 DIAGNOSIS — H0100B Unspecified blepharitis left eye, upper and lower eyelids: Secondary | ICD-10-CM | POA: Diagnosis not present

## 2020-09-28 ENCOUNTER — Other Ambulatory Visit: Payer: Self-pay | Admitting: Cardiovascular Disease

## 2020-09-28 ENCOUNTER — Other Ambulatory Visit: Payer: Self-pay | Admitting: Internal Medicine

## 2020-11-20 ENCOUNTER — Telehealth: Payer: Self-pay | Admitting: Cardiovascular Disease

## 2020-11-20 NOTE — Telephone Encounter (Signed)
Pt called to report that he has been having a "racing" heart beat for the last several weeks mostly when he wakes up in the morning and is laying in bed. He denies dizziness, chest pain and dyspnea. He says it gets better after 5-6 minutes... he does not have a way to check his rhythm with any device such as an apple watch. He has not taken any new OTC meds and no change in diet. He says it happens occasionally and not daily.   The pt says he turned 80 last week and his family took him to [place] for 5 days. He did a lot of walking without difficulty.  I have advised the pt to continue to monitor. To be sure that he is hydrating well and avoiding any stimulants such as alcohol, caffeine and any new OTC meds.  I will forward to Dr. [name] for his review and recommendations.   Pt next appt not until 04/2021.

## 2020-11-20 NOTE — Telephone Encounter (Signed)
Patient c/o Palpitations:  High priority if patient c/o lightheadedness, shortness of breath, or chest pain  How long have you had palpitations/irregular HR/ Afib? Are you having the symptoms now? No   Are you currently experiencing lightheadedness, SOB or CP? No chest is tight after PALP  Do you have a history of afib (atrial fibrillation) or irregular heart rhythm? No   Have you checked your BP or HR? (document readings if available): no   Are you experiencing any other symptoms? No it happens a night mostly.    BP 139/74 now

## 2020-11-21 ENCOUNTER — Encounter (HOSPITAL_COMMUNITY): Payer: Self-pay | Admitting: Emergency Medicine

## 2020-11-21 ENCOUNTER — Other Ambulatory Visit: Payer: Self-pay

## 2020-11-21 ENCOUNTER — Emergency Department (HOSPITAL_COMMUNITY)
Admission: EM | Admit: 2020-11-21 | Discharge: 2020-11-22 | Disposition: A | Discharge status: Home / Self Care | Payer: Medicare Other | Attending: Emergency Medicine | Admitting: Emergency Medicine

## 2020-11-21 ENCOUNTER — Emergency Department (HOSPITAL_COMMUNITY): Payer: Medicare Other

## 2020-11-21 DIAGNOSIS — I129 Hypertensive chronic kidney disease with stage 1 through stage 4 chronic kidney disease, or unspecified chronic kidney disease: Secondary | ICD-10-CM | POA: Insufficient documentation

## 2020-11-21 DIAGNOSIS — Z79899 Other long term (current) drug therapy: Secondary | ICD-10-CM | POA: Diagnosis not present

## 2020-11-21 DIAGNOSIS — I2510 Atherosclerotic heart disease of native coronary artery without angina pectoris: Secondary | ICD-10-CM | POA: Diagnosis not present

## 2020-11-21 DIAGNOSIS — N1831 Chronic kidney disease, stage 3a: Secondary | ICD-10-CM | POA: Diagnosis not present

## 2020-11-21 DIAGNOSIS — R002 Palpitations: Secondary | ICD-10-CM | POA: Diagnosis not present

## 2020-11-21 DIAGNOSIS — R531 Weakness: Secondary | ICD-10-CM | POA: Diagnosis not present

## 2020-11-21 DIAGNOSIS — R079 Chest pain, unspecified: Secondary | ICD-10-CM | POA: Insufficient documentation

## 2020-11-21 DIAGNOSIS — K219 Gastro-esophageal reflux disease without esophagitis: Secondary | ICD-10-CM | POA: Insufficient documentation

## 2020-11-21 DIAGNOSIS — R059 Cough, unspecified: Secondary | ICD-10-CM | POA: Insufficient documentation

## 2020-11-21 DIAGNOSIS — Z87891 Personal history of nicotine dependence: Secondary | ICD-10-CM | POA: Insufficient documentation

## 2020-11-21 DIAGNOSIS — Z7982 Long term (current) use of aspirin: Secondary | ICD-10-CM | POA: Insufficient documentation

## 2020-11-21 LAB — CBC WITH DIFFERENTIAL/PLATELET
Abs Immature Granulocytes: 0.04 10*3/uL (ref 0.00–0.07)
Basophils Absolute: 0.1 10*3/uL (ref 0.0–0.1)
Basophils Relative: 1 %
Eosinophils Absolute: 0.2 10*3/uL (ref 0.0–0.5)
Eosinophils Relative: 3 %
HCT: 41.5 % (ref 39.0–52.0)
Hemoglobin: 14.1 g/dL (ref 13.0–17.0)
Immature Granulocytes: 1 %
Lymphocytes Relative: 11 %
Lymphs Abs: 0.8 10*3/uL (ref 0.7–4.0)
MCH: 31.7 pg (ref 26.0–34.0)
MCHC: 34 g/dL (ref 30.0–36.0)
MCV: 93.3 fL (ref 80.0–100.0)
Monocytes Absolute: 1.3 10*3/uL — ABNORMAL HIGH (ref 0.1–1.0)
Monocytes Relative: 18 %
Neutro Abs: 4.9 10*3/uL (ref 1.7–7.7)
Neutrophils Relative %: 66 %
Platelets: 238 10*3/uL (ref 150–400)
RBC: 4.45 MIL/uL (ref 4.22–5.81)
RDW: 13.3 % (ref 11.5–15.5)
WBC: 7.3 10*3/uL (ref 4.0–10.5)
nRBC: 0 % (ref 0.0–0.2)

## 2020-11-21 LAB — TROPONIN I (HIGH SENSITIVITY): Troponin I (High Sensitivity): 8 ng/L (ref <18)

## 2020-11-21 LAB — COMPREHENSIVE METABOLIC PANEL
ALT: 22 U/L (ref 0–44)
AST: 20 U/L (ref 15–41)
Albumin: 4.1 g/dL (ref 3.5–5.0)
Alkaline Phosphatase: 68 U/L (ref 38–126)
Anion gap: 10 (ref 5–15)
BUN: 22 mg/dL (ref 8–23)
CO2: 25 mmol/L (ref 22–32)
Calcium: 9.5 mg/dL (ref 8.9–10.3)
Chloride: 101 mmol/L (ref 98–111)
Creatinine, Ser: 1.46 mg/dL — ABNORMAL HIGH (ref 0.61–1.24)
GFR, Estimated: 48 mL/min — ABNORMAL LOW (ref >60)
Glucose, Bld: 100 mg/dL — ABNORMAL HIGH (ref 70–99)
Potassium: 4.3 mmol/L (ref 3.5–5.1)
Sodium: 136 mmol/L (ref 135–145)
Total Bilirubin: 0.7 mg/dL (ref 0.3–1.2)
Total Protein: 6.7 g/dL (ref 6.5–8.1)

## 2020-11-21 LAB — MAGNESIUM: Magnesium: 2 mg/dL (ref 1.7–2.4)

## 2020-11-21 LAB — D-DIMER, QUANTITATIVE: D-Dimer, Quant: 0.34 ug/mL-FEU (ref 0.00–0.50)

## 2020-11-21 NOTE — ED Provider Notes (Signed)
Emergency Medicine Provider Triage Evaluation Note  Patrick [name] , a 80 y.o. male  was evaluated in triage.  Pt complains of presents with chest palpitations, states has been going for last 2 days, states is intermittent, feels more fatigued and winded with exertion, denies becoming diaphoretic, lightheaded or dizziness, has no history of cardiac arrhythmias, has had 2 stents in the past, is being followed by Dr. Johnsie Cancel, denies pedal edema, orthopnea, has no history of PEs or DVTs currently not on hormone therapy..  Review of Systems  Positive: Chest palpitations, shortness of breath Negative: Abdominal pain pedal edema  Physical Exam  BP (!) 150/70 (BP Location: Left Arm)   Pulse 78   Temp 99.7 F (37.6 C) (Oral)   Resp 16   Ht 5\' 7"  (1.702 m)   Wt 85.7 kg   SpO2 95%   BMI 29.60 kg/m  Gen:   Awake, no distress   Resp:  Normal effort  MSK:   Moves extremities without difficulty  Other:    Medical Decision Making  Medically screening exam initiated at 8:16 PM.  Appropriate orders placed.  Karion N Boys was informed that the remainder of the evaluation will be completed by another provider, this initial triage assessment does not replace that evaluation, and the importance of remaining in the ED until their evaluation is complete.  Patient presents with heart palpitations labwork imaging have been ordered, patient need further work-up in the emergency department.   Marcello Fennel, PA-C 11/21/20 2018    Deno Etienne, DO 11/21/20 2323

## 2020-11-21 NOTE — ED Provider Notes (Signed)
[facility] EMERGENCY DEPARTMENT Provider Note   CSN: [id number] Arrival date & time: 11/21/20  1948     History Chief Complaint  Patient presents with   Chest Pain    Patrick [name] is a 80 y.o. male.  Patient is a 80 year old male with a history of coronary artery disease status post stent placement, GERD, hypertension, hyperlipidemia, prior aortic root dilatation who presents with palpitations.  He said that he had an episode of palpitations about a month ago where he felt like his heart was racing.  He said they become more frequent over the last week forays had several a day and the last 2 days he has had feeling that he was having this through most of the day.  Generally it lasts about an hour.  He has no dizziness, worsening chest pain or shortness of breath associated with the palpitations.  He has some associated chest soreness that has been persistent.  To the center of his chest.  Its nonradiating.  He has no associated shortness of breath.  He recently traveled to [place] and had several episodes while he was there.  However he was able to climb up 160 steps without too much difficulty or shortness of breath.  He had a little bit of a cough today.  No leg swelling.  No fevers.  No congestion.      Past Medical History:  Diagnosis Date   Allergy    Aortic root dilatation (HCC)    Mild by echo 11/2011 (55mm)   BPH (benign prostatic hypertrophy)    Bradycardia    Precluding BB use.   CAD (coronary artery disease)    a. s/p Taxus DES to RCA and PDA in 2006 .  // b. Myoview 8/11: Walked 12:37. EF 57%. no ischemia or scar.  // c. Mod CAD by cath 11/2011 (40-50% ISR of PDA -- for OP myoview to assess).  //  d. Myoview 3/18: EF 58, no ischemia; Low Risk   Colon polyps    Complication of anesthesia    "they said they about lost, me when I had eye surgery"   Diverticulitis    GERD (gastroesophageal reflux disease)    Glaucoma    Hyperlipidemia    Hypertension     Impaired fasting glucose    Sleep apnea    Syncope     Patient Active Problem List   Diagnosis Date Noted   CAD (coronary artery disease) 10/15/2018   Chronic kidney disease, stage 3a ([place]) 07/01/2018   Coronary artery disease involving native coronary artery of native heart without angina pectoris 04/15/2016   Advance directive discussed with patient 08/08/2014   Dilated aortic root ([place]) 12/11/2011   Routine general medical examination at a health care facility 12/28/2010   IMPAIRED FASTING GLUCOSE 07/31/2007   Hyperlipidemia 01/09/2007   Essential hypertension 01/09/2007   ALLERGIC RHINITIS 01/09/2007   GERD 01/09/2007   DIVERTICULOSIS, COLON 01/09/2007   BPH without obstruction/lower urinary tract symptoms 01/09/2007   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   COLONIC POLYPS, HX OF 01/09/2007    Past Surgical History:  Procedure Laterality Date   ANGIOPLASTY     percutaneous transluminal coronary  angioplasty and stenting of the right coronary artery and posterior    CATARACT EXTRACTION W/ INTRAOCULAR LENS  IMPLANT, BILATERAL  9/15   COLONOSCOPY     INTRAOCULAR LENS INSERTION     LEFT HEART CATH AND CORONARY ANGIOGRAPHY N/A 10/14/2018   Procedure: LEFT HEART CATH AND CORONARY  ANGIOGRAPHY;  Surgeon: Martinique, Peter M, MD;  Location: [place];  Service: Cardiovascular;  Laterality: N/A;   LEFT HEART CATHETERIZATION WITH CORONARY ANGIOGRAM N/A 11/26/2011   Procedure: LEFT HEART CATHETERIZATION WITH CORONARY ANGIOGRAM;  Surgeon: Thayer Headings, MD;  Location: [facility] CATH LAB;  Service: Cardiovascular;  Laterality: N/A;   LUMBAR LAMINECTOMY/DECOMPRESSION MICRODISCECTOMY Left 05/27/2016   Procedure: Laminectomy for facet/synovial cyst - Lumbar four - Lumbar five - left;  Surgeon: Kary Kos, MD;  Location: [facility];  Service: Neurosurgery;  Laterality: Left;  Laminectomy for facet/synovial cyst - Lumbar four - Lumbar five - left   PILONIDAL CYST / SINUS EXCISION         Family History   Problem Relation Age of Onset   Coronary artery disease Father    Heart disease Father    Heart failure Father    Cancer Mother        ovarian cancer   Stroke Son    Hypertension Neg Hx    Diabetes Neg Hx     Social History   Tobacco Use   Smoking status: Former    Years: 30.00    Pack years: 0.00    Types: Cigarettes    Quit date: 05/13/1978    Years since quitting: 42.5   Smokeless tobacco: Never  Vaping Use   Vaping Use: Never used  Substance Use Topics   Alcohol use: Yes    Alcohol/week: 14.0 standard drinks    Types: 14 Glasses of wine per week   Drug use: No    Home Medications Prior to Admission medications   Medication Sig Start Date End Date Taking? Authorizing Provider  amLODipine (NORVASC) 5 MG tablet TAKE 1 TABLET BY MOUTH EVERY DAY Patient taking differently: Take 5 mg by mouth daily. 09/28/20  Yes Venia Carbon, MD  aspirin 81 MG tablet Take 81 mg by mouth daily.   Yes [provider]  cetirizine (ZYRTEC) 10 MG tablet Take 10 mg by mouth daily as needed for allergies.   Yes [provider]  Coenzyme Q10 (CO Q-10) 100 MG CAPS Take 1 capsule by mouth daily.   Yes [provider]  fish oil-omega-3 fatty acids 1000 MG capsule Take 1 g by mouth daily.   Yes [provider]  ketoconazole (NIZORAL) 2 % shampoo APPLY 1 APPLICATION TOPICALLY 2 (TWO) TIMES A WEEK. Patient taking differently: Apply 1 application topically See admin instructions. Twice weekly as needed for scalp irritation 07/13/20  Yes Venia Carbon, MD  latanoprost (XALATAN) 0.005 % ophthalmic solution Place 1 drop into both eyes at bedtime.  01/24/14  Yes [provider]  nitroGLYCERIN (NITROSTAT) 0.4 MG SL tablet Place 1 tablet (0.4 mg total) under the tongue every 5 (five) minutes x 3 doses as needed for chest pain. 10/12/18 01/20/21 Yes Nahser, Wonda Cheng, MD  olmesartan-hydrochlorothiazide (BENICAR HCT) 40-25 MG tablet TAKE 1 TABLET BY MOUTH EVERY  DAY Patient taking differently: Take 1 tablet by mouth daily. 09/28/20  Yes Nahser, Wonda Cheng, MD  omeprazole (PRILOSEC) 40 MG capsule Take 40 mg by mouth daily. 05/20/17  Yes [provider]  rosuvastatin (CRESTOR) 20 MG tablet TAKE 1/2 TABLET BY MOUTH DAILY Patient taking differently: Take 10 mg by mouth every evening. 06/12/20  Yes Nahser, Wonda Cheng, MD  triamcinolone cream (KENALOG) 0.1 % Apply 1 application topically as needed (skin irritation). 11/06/17   [provider]    Allergies    Atorvastatin, Ezetimibe-simvastatin, Lisinopril, and Pravastatin  Review of Systems   Review of Systems  Constitutional:  Negative for chills, diaphoresis, fatigue and fever.  HENT:  Negative for congestion, rhinorrhea and sneezing.   Eyes: Negative.   Respiratory:  Positive for cough. Negative for chest tightness and shortness of breath.   Cardiovascular:  Positive for chest pain and palpitations. Negative for leg swelling.  Gastrointestinal:  Negative for abdominal pain, blood in stool, diarrhea, nausea and vomiting.  Genitourinary:  Negative for difficulty urinating, flank pain, frequency and hematuria.  Musculoskeletal:  Negative for arthralgias and back pain.  Skin:  Negative for rash.  Neurological:  Negative for dizziness, speech difficulty, weakness, numbness and headaches.   Physical Exam Updated Vital Signs BP 120/74   Pulse 60   Temp 99.7 F (37.6 C) (Oral)   Resp 20   Ht 5\' 7"  (1.702 m)   Wt 85.7 kg   SpO2 96%   BMI 29.60 kg/m   Physical Exam Constitutional:      Appearance: He is well-developed.  HENT:     Head: Normocephalic and atraumatic.  Eyes:     Pupils: Pupils are equal, round, and reactive to light.  Cardiovascular:     Rate and Rhythm: Normal rate and regular rhythm.     Heart sounds: Normal heart sounds.  Pulmonary:     Effort: Pulmonary effort is normal. No respiratory distress.     Breath sounds: Normal breath sounds. No wheezing or rales.   Chest:     Chest wall: No tenderness.  Abdominal:     General: Bowel sounds are normal.     Palpations: Abdomen is soft.     Tenderness: There is no abdominal tenderness. There is no guarding or rebound.  Musculoskeletal:        General: Normal range of motion.     Cervical back: Normal range of motion and neck supple.     Comments: No edema or calf tenderness  Lymphadenopathy:     Cervical: No cervical adenopathy.  Skin:    General: Skin is warm and dry.     Findings: No rash.  Neurological:     Mental Status: He is alert and oriented to person, place, and time.    ED Results / Procedures / Treatments   Labs (all labs ordered are listed, but only abnormal results are displayed) Labs Reviewed  COMPREHENSIVE METABOLIC PANEL - Abnormal; Notable for the following components:      Result Value   Glucose, Bld 100 (*)    Creatinine, Ser 1.46 (*)    GFR, Estimated 48 (*)    All other components within normal limits  CBC WITH DIFFERENTIAL/PLATELET - Abnormal; Notable for the following components:   Monocytes Absolute 1.3 (*)    All other components within normal limits  MAGNESIUM  D-DIMER, QUANTITATIVE  TROPONIN I (HIGH SENSITIVITY)  TROPONIN I (HIGH SENSITIVITY)    EKG EKG Interpretation  Date/Time:  Tuesday November 21 2020 20:03:44 EDT Ventricular Rate:  76 PR Interval:  188 QRS Duration: 102 QT Interval:  386 QTC Calculation: 434 R Axis:   -14 Text Interpretation: Normal sinus rhythm Minimal voltage criteria for LVH, may be normal variant ( Cornell product ) Borderline ECG since last tracing no significant change Confirmed by Malvin Johns [phone]) on 11/21/2020 9:10:38 PM  Radiology DG Chest 2 View  Result Date: 11/21/2020 CLINICAL DATA:  Chest pain and weakness, initial encounter EXAM: CHEST - 2 VIEW COMPARISON:  10/14/2018 FINDINGS: Cardiac shadow is stable. Lungs are clear bilaterally. No  bony abnormality is noted. IMPRESSION: No acute abnormality seen.  Electronically Signed   By: Inez Catalina M.D.   On: 11/21/2020 21:45    Procedures Procedures   Medications Ordered in ED Medications - No data to display  ED Course  I have reviewed the triage vital signs and the nursing notes.  Pertinent labs & imaging results that were available during my care of the patient were reviewed by me and considered in my medical decision making (see chart for details).    MDM Rules/Calculators/A&P                          Patient presents with palpitations.  He has some mild chest soreness but denies any other symptoms associated with the palpitations.  His labs are nonconcerning.  His EKG shows a sinus rhythm with no ischemic changes.  He has had no episodes since he has been in the emergency department.  No evident arrhythmias on monitoring.  He has had 2 negative troponins.  His chest x-ray is clear without evidence of pulmonary edema.  He was discharged home in good condition.  He was encouraged to follow-up closely with his cardiologist.  He may need Holter monitoring.  His creatinine was slightly elevated and he was advised that he can have this followed up by his primary care physician.  His D-dimer was normal and he has no other suggestions of PE.  Return precautions were given. Final Clinical Impression(s) / ED Diagnoses Final diagnoses:  Palpitations    Rx / DC Orders ED Discharge Orders     None        Malvin Johns, MD 11/21/20 2309

## 2020-11-21 NOTE — Telephone Encounter (Signed)
STAT if HR is under 50 or over 120 (normal HR is 60-100 beats per minute)  What is your heart rate? 75, which usually runs around 55- this is high for him  Do you have a log of your heart rate readings (document readings)?   Do you have any other symptoms? Dont feel right in his chest

## 2020-11-21 NOTE — ED Triage Notes (Addendum)
Pt. c/o recurrent chest pain rating 3/10 with feelings of palpitations with weakness. HX stents in 2007, no recent cardiac problems. No acute distress in triage.

## 2020-11-22 DIAGNOSIS — Z20822 Contact with and (suspected) exposure to covid-19: Secondary | ICD-10-CM | POA: Diagnosis not present

## 2020-11-22 LAB — TROPONIN I (HIGH SENSITIVITY)
Troponin I (High Sensitivity): 8 ng/L (ref <18)
Troponin I (High Sensitivity): 9 ng/L (ref <18)

## 2020-11-22 MED ORDER — METOPROLOL SUCCINATE ER 25 MG PO TB24: 25.0000 mg | ORAL_TABLET | Freq: Every day | ORAL | 3 refills | Status: DC

## 2020-11-22 NOTE — Telephone Encounter (Signed)
Called pt reviewed Dr. Marlou Porch recommendations.  I advised him to check daily Bp and HR readings.  He is agreeable to this plan.  Order placed.

## 2020-11-23 ENCOUNTER — Encounter: Payer: Self-pay | Admitting: Family Medicine

## 2020-11-23 ENCOUNTER — Telehealth (INDEPENDENT_AMBULATORY_CARE_PROVIDER_SITE_OTHER): Payer: Medicare Other | Admitting: Family Medicine

## 2020-11-23 VITALS — BP 108/68 | HR 66

## 2020-11-23 DIAGNOSIS — U071 COVID-19: Secondary | ICD-10-CM

## 2020-11-23 MED ORDER — BENZONATATE 100 MG PO CAPS: 100.0000 mg | ORAL_CAPSULE | Freq: Three times a day (TID) | ORAL | 0 refills | Status: DC | PRN

## 2020-11-23 MED ORDER — NIRMATRELVIR/RITONAVIR (PAXLOVID) TABLET (RENAL DOSING): 2.0000 | ORAL_TABLET | Freq: Two times a day (BID) | ORAL | 0 refills | Status: AC

## 2020-11-23 NOTE — Patient Instructions (Addendum)
HOME CARE TIPS:  -I sent the medication(s) we discussed to your pharmacy: Meds ordered this encounter  Medications   nirmatrelvir/ritonavir EUA, renal dosing, (PAXLOVID) TABS    Sig: Take 2 tablets by mouth 2 (two) times daily for 5 days. (Take nirmatrelvir 150 mg one tablet twice daily for 5 days and ritonavir 100 mg one tablet twice daily for 5 days) Patient GFR is 48    Dispense:  20 tablet    Refill:  0   benzonatate (TESSALON PERLES) 100 MG capsule    Sig: Take 1 capsule (100 mg total) by mouth 3 (three) times daily as needed.    Dispense:  20 capsule    Refill:  0     -I sent in the [facility] treatment or referral you requested per our discussion. Please see the information provided below and discuss further with the pharmacist/treatment team.   -can use tylenol if needed for fevers, aches and pains per instructions  -can use nasal saline a few times per day if you have nasal congestion  -stay hydrated, drink plenty of fluids and eat small healthy meals - avoid dairy  -follow up with your doctor in 2-3 days unless improving and feeling better  -stay home while sick, except to seek medical care. If you have COVID19, ideally it would be best to stay home for a full 10 days since the onset of symptoms PLUS one day of no fever and feeling better. Wear a good mask that fits snugly (such as N95 or KN95) if around others to reduce the risk of transmission.  It was nice to meet you today, and I really hope you are feeling better soon. I help [place] out with telemedicine visits on Tuesdays and Thursdays and am available for visits on those days. If you have any concerns or questions following this visit please schedule a follow up visit with your Primary Care doctor or seek care at a local urgent care clinic to avoid delays in care.    Seek in person care or schedule a follow up video visit promptly if your symptoms worsen, new concerns arise or you are not improving with treatment.  Call 911 and/or seek emergency care if your symptoms are severe or life threatening.  FACT SHEET FOR PATIENTS, PARENTS, AND CAREGIVERS EMERGENCY USE AUTHORIZATION (EUA) OF PAXLOVID FOR CORONAVIRUS DISEASE 2019 (COVID-19) You are being given this Fact Sheet because your healthcare provider believes it is necessary to provide you with PAXLOVID for the treatment of mild-to-moderate coronavirus disease (COVID-19) caused by the SARS-CoV-2 virus. This Fact Sheet contains information to help you understand the risks and benefits of taking the PAXLOVID you have received or may receive. The U.S. Food and Drug Administration (FDA) has issued an Emergency Use Authorization (EUA) to make PAXLOVID available during the COVID-19 pandemic (for more details about an EUA please see "What is an Emergency Use Authorization?" at the end of this document). PAXLOVID is not an FDA-approved medicine in the [place]. Read this Fact Sheet for information about PAXLOVID. Talk to your healthcare provider about your options or if you have any questions. It is your choice to take PAXLOVID.  What is COVID-19? COVID-19 is caused by a virus called a coronavirus. You can get COVID-19 through close contact with another person who has the virus. COVID-19 illnesses have ranged from very mild-to-severe, including illness resulting in death. While information so far suggests that most COVID-19 illness is mild, serious illness can happen and may cause some  of your other medical conditions to become worse. Older people and people of all ages with severe, long lasting (chronic) medical conditions like heart disease, lung disease, and diabetes, for example seem to be at higher risk of being hospitalized for COVID-19.  What is PAXLOVID? PAXLOVID is an investigational medicine used to treat mild-to-moderate COVID-19 in adults and children [41 years of age and older weighing at least 92 pounds (64 kg)] with positive results of  direct SARS-CoV-2 viral testing, and who are at high risk for progression to severe COVID-19, including hospitalization or death. PAXLOVID is investigational because it is still being studied. There is limited information about the safety and effectiveness of using PAXLOVID to treat people with mild-to-moderate COVID-19.  The FDA has authorized the emergency use of PAXLOVID for the treatment of mild-tomoderate COVID-19 in adults and children [48 years of age and older weighing at least 96 pounds (94 kg)] with a positive test for the virus that causes COVID-19, and who are at high risk for progression to severe COVID-19, including hospitalization or death, under an EUA. 1 Revised: 28 July 2020   What should I tell my healthcare provider before I take PAXLOVID? Tell your healthcare provider if you: ? Have any allergies ? Have liver or kidney disease ? Are pregnant or plan to become pregnant ? Are breastfeeding a child ? Have any serious illnesses  Tell your healthcare provider about all the medicines you take, including prescription and over-the-counter medicines, vitamins, and herbal supplements. Some medicines may interact with PAXLOVID and may cause serious side effects. Keep a list of your medicines to show your healthcare provider and pharmacist when you get a new medicine.  You can ask your healthcare provider or pharmacist for a list of medicines that interact with PAXLOVID. Do not start taking a new medicine without telling your healthcare provider. Your healthcare provider can tell you if it is safe to take PAXLOVID with other medicines.  Tell your healthcare provider if you are taking combined hormonal contraceptive. PAXLOVID may affect how your birth control pills work. Females who are able to become pregnant should use another effective alternative form of contraception or an additional barrier method of contraception. Talk to your healthcare provider if you have any  questions about contraceptive methods that might be right for you.  How do I take PAXLOVID? ? PAXLOVID consists of 2 medicines: nirmatrelvir and ritonavir. o Take 2 pink tablets of nirmatrelvir with 1 white tablet of ritonavir by mouth 2 times each day (in the morning and in the evening) for 5 days. For each dose, take all 3 tablets at the same time. o If you have kidney disease, talk to your healthcare provider. You may need a different dose. ? Swallow the tablets whole. Do not chew, break, or crush the tablets. ? Take PAXLOVID with or without food. ? Do not stop taking PAXLOVID without talking to your healthcare provider, even if you feel better. ? If you miss a dose of PAXLOVID within 8 hours of the time it is usually taken, take it as soon as you remember. If you miss a dose by more than 8 hours, skip the missed dose and take the next dose at your regular time. Do not take 2 doses of PAXLOVID at the same time. ? If you take too much PAXLOVID, call your healthcare provider or go to the nearest hospital emergency room right away. ? If you are taking a ritonavir- or cobicistat-containing medicine to treat hepatitis C  or Human Immunodeficiency Virus (HIV), you should continue to take your medicine as prescribed by your healthcare provider. 2 Revised: 28 July 2020    Talk to your healthcare provider if you do not feel better or if you feel worse after 5 days.  Who should generally not take PAXLOVID? Do not take PAXLOVID if: ? You are allergic to nirmatrelvir, ritonavir, or any of the ingredients in PAXLOVID. ? You are taking any of the following medicines: o Alfuzosin o Pethidine, propoxyphene o Ranolazine o Amiodarone, dronedarone, flecainide, propafenone, quinidine o Colchicine o Lurasidone, pimozide, clozapine o Dihydroergotamine, ergotamine, methylergonovine o Lovastatin, simvastatin o Sildenafil (Revatio) for pulmonary arterial hypertension (PAH) o Triazolam, oral  midazolam o Apalutamide o Carbamazepine, phenobarbital, phenytoin o Rifampin o St. John's Wort (hypericum perforatum) Taking PAXLOVID with these medicines may cause serious or life-threatening side effects or affect how PAXLOVID works.  These are not the only medicines that may cause serious side effects if taken with PAXLOVID. PAXLOVID may increase or decrease the levels of multiple other medicines. It is very important to tell your healthcare provider about all of the medicines you are taking because additional laboratory tests or changes in the dose of your other medicines may be necessary while you are taking PAXLOVID. Your healthcare provider may also tell you about specific symptoms to watch out for that may indicate that you need to stop or decrease the dose of some of your other medicines.  What are the important possible side effects of PAXLOVID? Possible side effects of PAXLOVID are: ? Allergic Reactions. Allergic reactions can happen in people taking PAXLOVID, even after only 1 dose. Stop taking PAXLOVID and call your healthcare provider right away if you get any of the following symptoms of an allergic reaction: o hives o trouble swallowing or breathing o swelling of the mouth, lips, or face o throat tightness o hoarseness 3 Revised: 28 July 2020  o skin rash ? Liver Problems. Tell your healthcare provider right away if you have any of these signs and symptoms of liver problems: loss of appetite, yellowing of your skin and the whites of eyes (jaundice), dark-colored urine, pale colored stools and itchy skin, stomach area (abdominal) pain. ? Resistance to HIV Medicines. If you have untreated HIV infection, PAXLOVID may lead to some HIV medicines not working as well in the future. ? Other possible side effects include: o altered sense of taste o diarrhea o high blood pressure o muscle aches These are not all the possible side effects of PAXLOVID. Not many people  have taken PAXLOVID. Serious and unexpected side effects may happen. PAXLOVID is still being studied, so it is possible that all of the risks are not known at this time.  What other treatment choices are there? Veklury (remdesivir) is FDA-approved for the treatment of mild-to-moderate [id number] in certain adults and children. Talk with your doctor to see if Marijean Heath is appropriate for you. Like PAXLOVID, FDA may also allow for the emergency use of other medicines to treat people with COVID-19. Go to https://price.info/ for information on the emergency use of other medicines that are authorized by FDA to treat people with COVID-19. Your healthcare provider may talk with you about clinical trials for which you may be eligible. It is your choice to be treated or not to be treated with PAXLOVID. Should you decide not to receive it or for your child not to receive it, it will not change your standard medical care.  What if I am pregnant or  breastfeeding? There is no experience treating pregnant women or breastfeeding mothers with PAXLOVID. For a mother and unborn baby, the benefit of taking PAXLOVID may be greater than the risk from the treatment. If you are pregnant, discuss your options and specific situation with your healthcare provider. It is recommended that you use effective barrier contraception or do not have sexual activity while taking PAXLOVID. If you are breastfeeding, discuss your options and specific situation with your healthcare provider. 4 Revised: 28 July 2020   How do I report side effects with PAXLOVID? Contact your healthcare provider if you have any side effects that bother you or do not go away. Report side effects to FDA MedWatch at SmoothHits.hu or call 1-800-FDA1088 or you can report side effects to [employer]. at the contact information  provided below. Website Fax number Telephone number www.pfizersafetyreporting.com [phone] How should I store [place]? Store PAXLOVID tablets at room temperature, between 68?F to 77?F (20?C to 25?C). How can I learn more about COVID-19? ? Ask your healthcare provider. ? Visit https://jacobson-johnson.com/. ? Contact your local or state public health department. What is an Emergency Use Authorization (EUA)? The [place] FDA has made PAXLOVID available under an emergency access mechanism called an Emergency Use Authorization (EUA). The EUA is supported by a Education officer, museum and Human Service (HHS) declaration that circumstances exist to justify the emergency use of drugs and biological products during the COVID-19 pandemic. PAXLOVID for the treatment of mild-to-moderate COVID-19 in adults and children [53 years of age and older weighing at least 48 pounds (39 kg)] with positive results of direct SARS-CoV-2 viral testing, and who are at high risk for progression to severe COVID-19, including hospitalization or death, has not undergone the same type of review as an FDA-approved product. In issuing an EUA under the LIDCV-01 public health emergency, the FDA has determined, among other things, that based on the total amount of scientific evidence available including data from adequate and well-controlled clinical trials, if available, it is reasonable to believe that the product may be effective for diagnosing, treating, or preventing COVID-19, or a serious or life-threatening disease or condition caused by COVID-19; that the known and potential benefits of the product, when used to diagnose, treat, or prevent such disease or condition, outweigh the known and potential risks of such product; and that there are no adequate, approved, and available alternatives. All of these criteria must be met to allow for the product to be used in the treatment of patients during  the COVID-19 pandemic. The EUA for PAXLOVID is in effect for the duration of the COVID-19 declaration justifying emergency use of this product, unless terminated or revoked (after which the products may no longer be used under the EUA). 5 Revised: 28 July 2020     Additional Information For general questions, visit the website or call the telephone number provided below. Website Telephone number www.COVID19oralRx.com [phone] (1-877-C19-PACK) You can also go to www.pfizermedinfo.com or call [phone] for more information. [id number] Revised: 28 July 2020

## 2020-11-23 NOTE — Progress Notes (Signed)
Virtual Visit via Telephone Note  I connected with Patrick [name] on 11/23/20 at  3:40 PM EDT by telephone and verified that I am speaking with the correct person using two identifiers.   I discussed the limitations, risks, security and privacy concerns of performing an evaluation and management service by telephone and the availability of in person appointments. I also discussed with the patient that there may be a patient responsible charge related to this service. The patient expressed understanding and agreed to proceed.  Location patient: home, [place] Location provider: work or home office Participants present for the call: patient, provider Patient did not have a visit with me in the prior 7 days to address this/these issue(s).   History of Present Illness:  Acute telemedicine visit for Covid19: -Onset: 2 days; had a positive covid test and a positive PCR test -daughter has covid -Symptoms include: cough, sore throat, body aches -Denies:fever, CP, SOB, NVD, inability to get out bed, eat, drink -O2 > 95% on room air -Pertinent past medical history: Covid19 last year as well (mild) -Pertinent medication allergies:  Allergies  Allergen Reactions   Atorvastatin Other (See Comments)    MYALGIAS, LEG CRAMPING   Ezetimibe-Simvastatin Other (See Comments)    MYALGIAS, CRAMPING   Lisinopril Cough   Pravastatin Other (See Comments)    Myalgias at 40mg  dosage  -COVID-19 vaccine status: 2 doses + 1 booster -had recent labs and GFR is 48  Past Medical History:  Diagnosis Date   Allergy    Aortic root dilatation (HCC)    Mild by echo 11/2011 (81mm)   BPH (benign prostatic hypertrophy)    Bradycardia    Precluding BB use.   CAD (coronary artery disease)    a. s/p Taxus DES to RCA and PDA in 2006 .  // b. Myoview 8/11: Walked 12:37. EF 57%. no ischemia or scar.  // c. Mod CAD by cath 11/2011 (40-50% ISR of PDA -- for OP myoview to assess).  //  d. Myoview 3/18: EF 58, no ischemia; Low Risk    Colon polyps    Complication of anesthesia    "they said they about lost, me when I had eye surgery"   Diverticulitis    GERD (gastroesophageal reflux disease)    Glaucoma    Hyperlipidemia    Hypertension    Impaired fasting glucose    Sleep apnea    Syncope      Observations/Objective: Patient sounds cheerful and well on the phone. I do not appreciate any SOB. Speech and thought processing are grossly intact. Patient reported vitals:  Assessment and Plan:  No diagnosis found.   Discussed treatment options (infusions and oral), treatment risks, ideal treatment window, potential complications, isolation and precautions for COVID-19.  After lengthy discussion, the patient opted for treatment with Paxlovid due to being higher risk for complications of covid or severe disease and other factors. Renally dosed based on recent labs. Discussed potential drug interactions and advise to hold his statin and 1/2 his amlodipine while on the PAxlovid. Had him repeat back these instructions. Also, advised that he discussed all of his meds with the pharamcist as well in case any further recs are advised. Discussed EUA status of this drug and the fact that there is preliminary limited knowledge of risks/interactions/side effects per EUA document vs possible benefits and precautions. This information was shared with patient during the visit and also was provided in patient instructions. Also, advised that patient discuss risks/interactions and use with  pharmacist/treatment team as well. The patient did want a prescription for cough, Tessalon Rx sent.  Other symptomatic care measures summarized in patient instructions.  Advised to seek prompt in person care if worsening, new symptoms arise, or if is not improving with treatment. Advised of options for inperson care in case PCP office not available. Did let the patient know that I only do telemedicine shifts for [place] on Tuesdays and Thursdays and advised  a follow up visit with PCP or at an [facility] if has further questions or concerns.   Follow Up Instructions:  I did not refer this patient for an OV with me in the next 24 hours for this/these issue(s).  I discussed the assessment and treatment plan with the patient. The patient was provided an opportunity to ask questions and all were answered. The patient agreed with the plan and demonstrated an understanding of the instructions.   I spent 18 minutes on the date of this visit in the care of this patient. See summary of tasks completed to properly care for this patient in the detailed notes above which also included counseling of above, review of PMH, medications, allergies, evaluation of the patient and ordering and/or  instructing patient on testing and care options.     Lucretia Kern, DO

## 2020-11-27 ENCOUNTER — Telehealth: Payer: Self-pay

## 2020-11-27 NOTE — Telephone Encounter (Signed)
Left message on VM to see how he was doing after recent ER visit for chest pain.

## 2020-12-01 ENCOUNTER — Telehealth: Payer: Self-pay | Admitting: Cardiovascular Disease

## 2020-12-01 NOTE — Telephone Encounter (Signed)
New Message:     This patient is an add on for Dr [name] on 12-04-20'@1'$ :91

## 2020-12-03 ENCOUNTER — Encounter: Payer: Self-pay | Admitting: Cardiovascular Disease

## 2020-12-03 NOTE — Progress Notes (Signed)
Patrick [name] Date of Birth  Nov 14, 1940       [name]. [address], [place]   [place], [place]  [address]    [phone]  Problem List: 1. Coronary artery disease-moderate.  He's had a previous stent placed. 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 A subsequent Myoview study was normal. 2. Hypertension 3. Hyperlipidemia 4. Sleep apnea  Patrick [name] is doing very will.   He has been on a mission trip and to [employer].  No further episodes of chest pain.   He hauled 5 loads of hogs last week - his brother raises the hogs.  He is planning on going to [place] over Christmas.  He still does his lawn care business.  He walks at the [employer]. Patrick [name] - 3 miles a day.  Without symptoms.  November 09, 2012:  Patrick [name] is doing well.  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   He is a retired Administrator.   Still walking 3 days a week.    Oct. 9, 2015:  Still very active - mows 8-9 yards a week.  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  No angina. BP is high. Has had a cold - is taking chlorpheneramine  Has had normal Bp at home   Sep 19, 2014:   Still very active.  Drives a truck.  Mows and trims 10 yards a week without any problems.  Has atypical  chest pain with mental stress but never with physical exertion.  Does not restrict his diet.   Nov. 18, 2016  Still working hard ( driving a truck and mowing yards)  Gets more short of breath compared to several years ago  Still eats some salty foods.   Has a lady who is cooking for him  - knows that she puts lots of salt in the food  October 16, 2015:  Patrick [name] is seen back for his CAD, HTN, HLD . OSA  Just got back from a cruise in [place] .  Still very active.   Mows yards, still drives trucks.     Dec. 4, 2017:  Patrick [name] is seen today with [facility]. Still drives his truck and mows regularly .   No real CP or dyspnea.   Very rare episodes of chest tightness.  Keeps his BP at  home.  Seems to go up when he comes to the doctor .   October 31, 2016:  Patrick [name] is seen today for follow up  Had back surgery in January .   Back pain has returned . Still very busy,   Mows frequently .   Has a [employer] that he loves but seems to contribute to his back pain.   No angnia  BP at home has been mildly elevated.   Admits that he eats more salt than he should .   Jan. 15, 2019:   BP is a little elevate.  No dyspnea. , no CP  Eating a bit of extra salt.  Has not been exercising .   January 26, 2018: Doing well BP at home has been good . Keeps a BP log. BP is a bit high today  - ate some hotdogs this weekend .  No CP . Exercising some .   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 and [place]  Did  lots of walking in [place]   May 17, 2019:  What is seen today for follow-up of his mild coronary artery disease, hypertension, hyperlipidemia. 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 Always a bit high when he comes in to the office    Jan. 3, 2022 Patrick [name] is seen today for follow up of his CAD , HTN, HLD No cp  Still working very hard.  Cleaned out 8 hen houses over the past week or so .  Walks 3 miles a day several times a week .  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 No CP  No dyspnea   Had palpitations  - would wake him up Last 15 minutes.  Was occurring frequenlty,  now has not had an episode for 10-12 days  We discussed a monitor but now he is not having any palpitation .  Will try propranolol  Toprol xl was called in but he is not taking it    Current Outpatient Medications on File Prior to Visit  Medication Sig Dispense Refill   amLODipine (NORVASC) 5 MG tablet TAKE 1 TABLET BY MOUTH  EVERY DAY 90 tablet 3   aspirin 81 MG tablet Take 81 mg by mouth daily.     cetirizine (ZYRTEC) 10 MG tablet Take 10 mg by mouth daily as needed for allergies.     Coenzyme Q10 (CO Q-10) 100 MG CAPS Take 1 capsule by mouth daily.     ketoconazole (NIZORAL) 2 % shampoo APPLY 1 APPLICATION TOPICALLY 2 (TWO) TIMES A WEEK. 120 mL 5   latanoprost (XALATAN) 0.005 % ophthalmic solution Place 1 drop into both eyes at bedtime.      nitroGLYCERIN (NITROSTAT) 0.4 MG SL tablet Place 1 tablet (0.4 mg total) under the tongue every 5 (five) minutes x 3 doses as needed for chest pain. 25 tablet 6   olmesartan-hydrochlorothiazide (BENICAR HCT) 40-25 MG tablet TAKE 1 TABLET BY MOUTH EVERY DAY 90 tablet 1   omeprazole (PRILOSEC) 10 MG capsule Take 10 mg by mouth daily.     rosuvastatin (CRESTOR) 20 MG tablet TAKE 1/2 TABLET BY MOUTH DAILY 45 tablet 3   triamcinolone cream (KENALOG) 0.1 % Apply 1 application topically as needed (skin irritation).  0   benzonatate (TESSALON PERLES) 100 MG capsule Take 1 capsule (100 mg total) by mouth 3 (three) times daily as needed. 20 capsule 0   metoprolol succinate (TOPROL XL) 25 MG 24 hr tablet Take 1 tablet (25 mg total) by mouth daily. (Patient not taking: Reported on 12/04/2020) 90 tablet 3   No current facility-administered medications on file prior to visit.    Allergies  Allergen Reactions   Atorvastatin Other (See Comments)    MYALGIAS, LEG CRAMPING   Ezetimibe-Simvastatin Other (See Comments)    MYALGIAS, CRAMPING   Lisinopril Cough   Pravastatin Other (See Comments)    Myalgias at '40mg'$  dosage    Past Medical History:  Diagnosis Date   Allergy    Aortic root dilatation (HCC)    Mild by echo 11/2011 (80m)   BPH (benign prostatic hypertrophy)    Bradycardia    Precluding BB use.   CAD (coronary artery disease)    a. s/p Taxus DES  to RCA and PDA in 2006 .  // b. Myoview 8/11: Walked 12:37. EF 57%. no ischemia or scar.  // c. Mod CAD by cath 11/2011 (40-50%  ISR of PDA -- for OP myoview to assess).  //  d. Myoview 3/18: EF 58, no ischemia; Low Risk   Colon polyps    Complication of anesthesia    "they said they about lost, me when I had eye surgery"   Diverticulitis    GERD (gastroesophageal reflux disease)    Glaucoma    Hyperlipidemia    Hypertension    Impaired fasting glucose    Sleep apnea    Syncope     Past Surgical History:  Procedure Laterality Date   ANGIOPLASTY     percutaneous transluminal coronary  angioplasty and stenting of the right coronary artery and posterior    CATARACT EXTRACTION W/ INTRAOCULAR LENS  IMPLANT, BILATERAL  9/15   COLONOSCOPY     INTRAOCULAR LENS INSERTION     LEFT HEART CATH AND CORONARY ANGIOGRAPHY N/A 10/14/2018   Procedure: LEFT HEART CATH AND CORONARY ANGIOGRAPHY;  Surgeon: Martinique, Peter M, MD;  Location: [facility];  Service: Cardiovascular;  Laterality: N/A;   LEFT HEART CATHETERIZATION WITH CORONARY ANGIOGRAM N/A 11/26/2011   Procedure: LEFT HEART CATHETERIZATION WITH CORONARY ANGIOGRAM;  Surgeon: Thayer Headings, MD;  Location: [facility] CATH LAB;  Service: Cardiovascular;  Laterality: N/A;   LUMBAR LAMINECTOMY/DECOMPRESSION MICRODISCECTOMY Left 05/27/2016   Procedure: Laminectomy for facet/synovial cyst - Lumbar four - Lumbar five - left;  Surgeon: Kary Kos, MD;  Location: [facility];  Service: Neurosurgery;  Laterality: Left;  Laminectomy for facet/synovial cyst - Lumbar four - Lumbar five - left   PILONIDAL CYST / SINUS EXCISION      Social History   Tobacco Use  Smoking Status Former   Years: 30.00   Types: Cigarettes   Quit date: 05/13/1978   Years since quitting: 42.5  Smokeless Tobacco Never    Social History   Substance and Sexual Activity  Alcohol Use Yes   Alcohol/week: 14.0 standard drinks   Types: 14 Glasses of wine per week    Family History  Problem Relation Age of Onset   Coronary artery disease Father    Heart disease Father    Heart failure Father    Cancer Mother         ovarian cancer   Stroke Son    Hypertension Neg Hx    Diabetes Neg Hx     Reviw of Systems:  Noted in current history, otherwise systems are negative.  Physical Exam: Blood pressure 138/78, pulse 67, height '5\' 7"'$  (1.702 m), weight 187 lb (84.8 kg), SpO2 96 %.  GEN:  Well nourished, well developed in no acute distress HEENT: Normal NECK: No JVD; No carotid bruits LYMPHATICS: No lymphadenopathy CARDIAC: RRR , no murmurs, rubs, gallops RESPIRATORY:  Clear to auscultation without rales, wheezing or rhonchi  ABDOMEN: Soft, non-tender, non-distended MUSCULOSKELETAL:  No edema; No deformity  SKIN: Warm and dry NEUROLOGIC:  Alert and oriented x 3   ECG:    Assessment / Plan:   1. Coronary artery disease-moderate.   No angina .    2. Hypertension-      BP is well controlled.   3.  Hyperlipidemia:  managed by Dr. Silvio Pate  4. Sleep apnea-     5.  Palpitations:   this palpitations have resolved.  We called in toprol XL but he has not started it yet  Will send in a script for propranolol 10 mg QID  PRN in case he starts having more palpitations   Mertie Moores, MD  12/04/2020 2:14 PM    [place],  [place], [place]  [address] Pager [phone] Phone: [phone]; Fax: [phone]

## 2020-12-04 ENCOUNTER — Other Ambulatory Visit: Payer: Self-pay

## 2020-12-04 ENCOUNTER — Encounter: Payer: Self-pay | Admitting: Cardiovascular Disease

## 2020-12-04 ENCOUNTER — Ambulatory Visit (INDEPENDENT_AMBULATORY_CARE_PROVIDER_SITE_OTHER): Payer: Medicare Other | Admitting: Cardiovascular Disease

## 2020-12-04 VITALS — BP 138/78 | HR 67 | Ht 67.0 in | Wt 187.0 lb

## 2020-12-04 DIAGNOSIS — I2510 Atherosclerotic heart disease of native coronary artery without angina pectoris: Secondary | ICD-10-CM | POA: Diagnosis not present

## 2020-12-04 DIAGNOSIS — R002 Palpitations: Secondary | ICD-10-CM | POA: Insufficient documentation

## 2020-12-04 MED ORDER — PROPRANOLOL HCL 10 MG PO TABS: 10.0000 mg | ORAL_TABLET | Freq: Four times a day (QID) | ORAL | 3 refills | Status: DC | PRN

## 2020-12-04 NOTE — Patient Instructions (Signed)
Medication Instructions:  Your physician has recommended you make the following change in your medication: 1) START taking propanolol 10 mg as needed for palpitations *If you need a refill on your cardiac medications before your next appointment, please call your pharmacy*  Follow-Up: At [facility], you and your health needs are our priority.  As part of our continuing mission to provide you with exceptional heart care, we have created designated Provider Care Teams.  These Care Teams include your primary Cardiologist (physician) and Advanced Practice Providers (APPs -  Physician Assistants and Nurse Practitioners) who all work together to provide you with the care you need, when you need it.  Your next appointment:   1 year(s)  The format for your next appointment:   In Person  Provider:   You may see Mertie Moores, MD or one of the following Advanced Practice Providers on your designated Care Team:   Richardson Dopp, PA-C [place], [place]

## 2021-01-10 IMAGING — CR PORTABLE CHEST - 1 VIEW
1 series · 1 of 1 positions shown · non-contrast
Comparison: 05/20/2016

CLINICAL DATA: Chest pain

EXAM:
PORTABLE CHEST 1 VIEW

[AP]
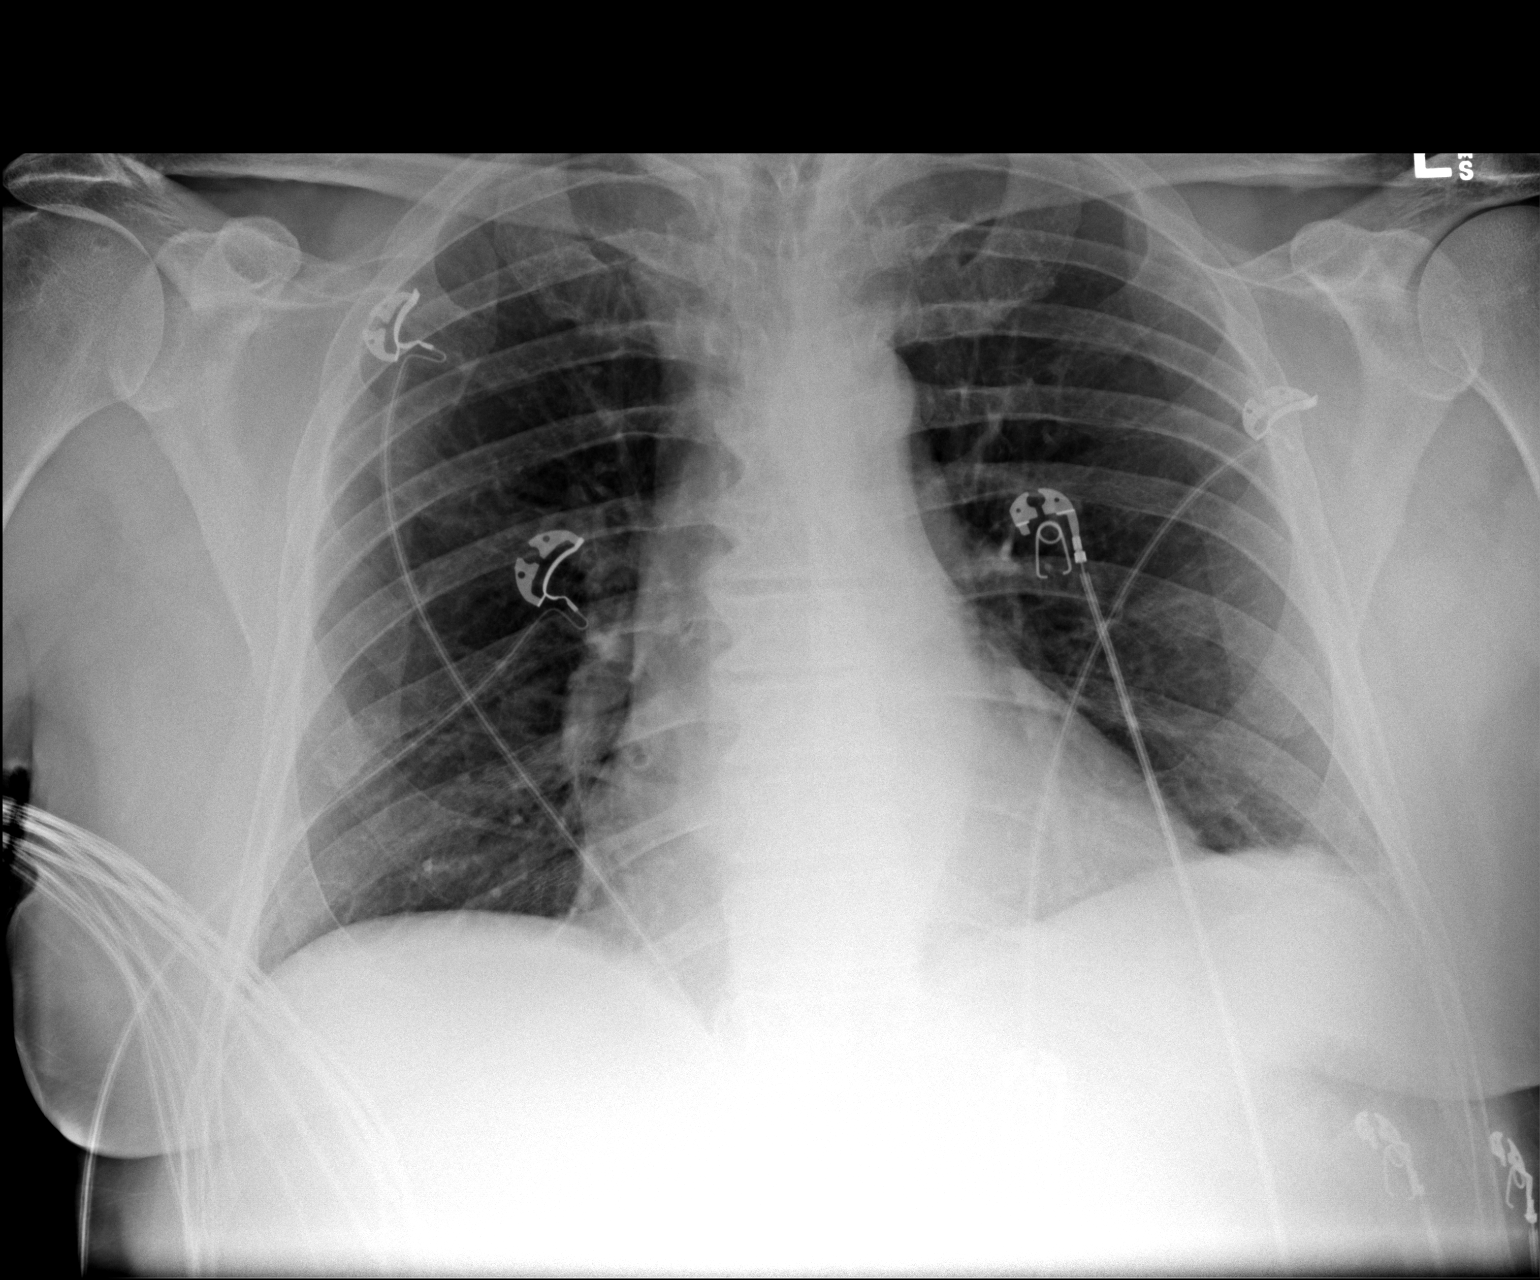

[1 of 1 positions shown; findings below may reference images not displayed]

FINDINGS: Normal heart size and mediastinal contours. Mild scarring at the
left base. No acute infiltrate or edema. No effusion or
pneumothorax. No acute osseous findings.
IMPRESSION: Stable chest.

## 2021-01-16 DIAGNOSIS — H401132 Primary open-angle glaucoma, bilateral, moderate stage: Secondary | ICD-10-CM | POA: Diagnosis not present

## 2021-02-15 DIAGNOSIS — L570 Actinic keratosis: Secondary | ICD-10-CM | POA: Diagnosis not present

## 2021-02-15 DIAGNOSIS — L821 Other seborrheic keratosis: Secondary | ICD-10-CM | POA: Diagnosis not present

## 2021-02-15 DIAGNOSIS — L814 Other melanin hyperpigmentation: Secondary | ICD-10-CM | POA: Diagnosis not present

## 2021-02-15 DIAGNOSIS — L853 Xerosis cutis: Secondary | ICD-10-CM | POA: Diagnosis not present

## 2021-02-15 DIAGNOSIS — L820 Inflamed seborrheic keratosis: Secondary | ICD-10-CM | POA: Diagnosis not present

## 2021-02-15 DIAGNOSIS — D3617 Benign neoplasm of peripheral nerves and autonomic nervous system of trunk, unspecified: Secondary | ICD-10-CM | POA: Diagnosis not present

## 2021-02-20 DIAGNOSIS — H401132 Primary open-angle glaucoma, bilateral, moderate stage: Secondary | ICD-10-CM | POA: Diagnosis not present

## 2021-03-09 ENCOUNTER — Telehealth: Payer: Self-pay | Admitting: Cardiovascular Disease

## 2021-03-09 NOTE — Telephone Encounter (Signed)
Tired to call patient's daughter (DPR)- no answer. Will try again later.

## 2021-03-09 NOTE — Telephone Encounter (Signed)
STAT if HR is under 50 or over 120 (normal HR is 60-100 beats per minute)  What is your heart rate? 50's  Do you have a log of your heart rate readings (document readings)? 50's  Do you have any other symptoms? Dizzy spells    Patient's daughter states the patient has been having dizzy spells. She says he had an episode yesterday and today. She says he did not pass out, but they were questioning if he had vertigo. She says she will also be calling his PCP.

## 2021-03-12 ENCOUNTER — Other Ambulatory Visit (INDEPENDENT_AMBULATORY_CARE_PROVIDER_SITE_OTHER): Payer: Medicare Other

## 2021-03-12 ENCOUNTER — Other Ambulatory Visit: Payer: Self-pay | Admitting: Internal Medicine

## 2021-03-12 ENCOUNTER — Other Ambulatory Visit: Payer: Self-pay

## 2021-03-12 DIAGNOSIS — I2510 Atherosclerotic heart disease of native coronary artery without angina pectoris: Secondary | ICD-10-CM

## 2021-03-12 LAB — CBC
HCT: 44.7 % (ref 39.0–52.0)
Hemoglobin: 14.6 g/dL (ref 13.0–17.0)
MCHC: 32.8 g/dL (ref 30.0–36.0)
MCV: 93 fl (ref 78.0–100.0)
Platelets: 257 10*3/uL (ref 150.0–400.0)
RBC: 4.8 Mil/uL (ref 4.22–5.81)
RDW: 13.9 % (ref 11.5–15.5)
WBC: 6 10*3/uL (ref 4.0–10.5)

## 2021-03-12 LAB — COMPREHENSIVE METABOLIC PANEL
ALT: 14 U/L (ref 0–53)
AST: 14 U/L (ref 0–37)
Albumin: 4.5 g/dL (ref 3.5–5.2)
Alkaline Phosphatase: 64 U/L (ref 39–117)
BUN: 25 mg/dL — ABNORMAL HIGH (ref 6–23)
CO2: 29 mEq/L (ref 19–32)
Calcium: 9.7 mg/dL (ref 8.4–10.5)
Chloride: 102 mEq/L (ref 96–112)
Creatinine, Ser: 1.16 mg/dL (ref 0.40–1.50)
GFR: 59.56 mL/min — ABNORMAL LOW (ref >60.00)
Glucose, Bld: 101 mg/dL — ABNORMAL HIGH (ref 70–99)
Potassium: 4.4 mEq/L (ref 3.5–5.1)
Sodium: 138 mEq/L (ref 135–145)
Total Bilirubin: 0.5 mg/dL (ref 0.2–1.2)
Total Protein: 6.8 g/dL (ref 6.0–8.3)

## 2021-03-12 LAB — LIPID PANEL
Cholesterol: 157 mg/dL (ref 0–200)
HDL: 46.8 mg/dL (ref >39.00)
LDL Cholesterol: 87 mg/dL (ref 0–99)
NonHDL: 110.52
Total CHOL/HDL Ratio: 3
Triglycerides: 120 mg/dL (ref 0.0–149.0)
VLDL: 24 mg/dL (ref 0.0–40.0)

## 2021-03-14 ENCOUNTER — Ambulatory Visit: Payer: Medicare Other | Admitting: Cardiovascular Disease

## 2021-03-14 ENCOUNTER — Encounter: Payer: Self-pay | Admitting: Cardiovascular Disease

## 2021-03-14 ENCOUNTER — Other Ambulatory Visit: Payer: Self-pay

## 2021-03-14 VITALS — BP 148/74 | HR 60 | Ht 67.0 in | Wt 182.2 lb

## 2021-03-14 DIAGNOSIS — I2510 Atherosclerotic heart disease of native coronary artery without angina pectoris: Secondary | ICD-10-CM

## 2021-03-14 MED ORDER — MECLIZINE HCL 25 MG PO TABS: 25.0000 mg | ORAL_TABLET | Freq: Three times a day (TID) | ORAL | 1 refills | Status: DC | PRN

## 2021-03-14 NOTE — Progress Notes (Signed)
Patrick [name] Date of Birth  Nov 14, 1940       Patrick [name]. [address], [place]   [place], [place]  [address]    [phone]  Problem List: 1. Coronary artery disease-moderate.  He's had a previous stent placed. 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 A subsequent Myoview study was normal. 2. Hypertension 3. Hyperlipidemia 4. Sleep apnea  Patrick [name] is doing very will.   He has been on a mission trip and to [employer].  No further episodes of chest pain.   He hauled 5 loads of hogs last week - his brother raises the hogs.  He is planning on going to [place] over Christmas.  He still does his lawn care business.  He walks at the [employer]. Patrick [name] - 3 miles a day.  Without symptoms.  November 09, 2012:  Patrick [name] is doing well.  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   He is a retired Administrator.   Still walking 3 days a week.    Oct. 9, 2015:  Still very active - mows 8-9 yards a week.  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  No angina. BP is high. Has had a cold - is taking chlorpheneramine  Has had normal Bp at home   Sep 19, 2014:   Still very active.  Drives a truck.  Mows and trims 10 yards a week without any problems.  Has atypical  chest pain with mental stress but never with physical exertion.  Does not restrict his diet.   Nov. 18, 2016  Still working hard ( driving a truck and mowing yards)  Gets more short of breath compared to several years ago  Still eats some salty foods.   Has a lady who is cooking for him  - knows that she puts lots of salt in the food  October 16, 2015:  Patrick [name] is seen back for his CAD, HTN, HLD . OSA  Just got back from a cruise in [place] .  Still very active.   Mows yards, still drives trucks.     Dec. 4, 2017:  Patrick [name] is seen today with [facility]. Still drives his truck and mows regularly .   No real CP or dyspnea.   Very rare episodes of chest tightness.  Keeps his BP at  home.  Seems to go up when he comes to the doctor .   October 31, 2016:  Patrick [name] is seen today for follow up  Had back surgery in January .   Back pain has returned . Still very busy,   Mows frequently .   Has a [employer] that he loves but seems to contribute to his back pain.   No angnia  BP at home has been mildly elevated.   Admits that he eats more salt than he should .   Jan. 15, 2019:   BP is a little elevate.  No dyspnea. , no CP  Eating a bit of extra salt.  Has not been exercising .   January 26, 2018: Doing well BP at home has been good . Keeps a BP log. BP is a bit high today  - ate some hotdogs this weekend .  No CP . Exercising some .   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 and [place]  Did  lots of walking in [place]   May 17, 2019:  What is seen today for follow-up of his mild coronary artery disease, hypertension, hyperlipidemia. 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 Always a bit high when he comes in to the office    Jan. 3, 2022 Patrick [name] is seen today for follow up of his CAD , HTN, HLD No cp  Still working very hard.  Cleaned out 8 hen houses over the past week or so .  Walks 3 miles a day several times a week .  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 No CP  No dyspnea   Had palpitations  - would wake him up Last 15 minutes.  Was occurring frequenlty,  now has not had an episode for 10-12 days  We discussed a monitor but now he is not having any palpitation .  Will try propranolol  Toprol xl was called in but he is not taking it   Nov. 2, 2022 Woke up last week , room was spinning  Had a spontaneous nose bleed  Not constant  He denies any chest pain or shortness of breath.   He has been able to walk for many miles.  He rides his bike on a regular basis.  He is not had any cardiac issues.  HR has been slow  Symptoms sound more like vertigo     Current Outpatient Medications on File Prior to Visit  Medication Sig Dispense Refill   amLODipine (NORVASC) 5 MG tablet TAKE 1 TABLET BY MOUTH EVERY DAY 90 tablet 3   aspirin 81 MG tablet Take 81 mg by mouth daily.     cetirizine (ZYRTEC) 10 MG tablet Take 10 mg by mouth daily as needed for allergies.     Coenzyme Q10 (CO Q-10) 100 MG CAPS Take 1 capsule by mouth daily.     ketoconazole (NIZORAL) 2 % shampoo APPLY 1 APPLICATION TOPICALLY 2 (TWO) TIMES A WEEK. 120 mL 5   latanoprost (XALATAN) 0.005 % ophthalmic solution Place 1 drop into both eyes at bedtime.      nitroGLYCERIN (NITROSTAT) 0.4 MG SL tablet Place 1 tablet (0.4 mg total) under the tongue every 5 (five) minutes x 3 doses as needed for chest pain. 25 tablet 6   olmesartan-hydrochlorothiazide (BENICAR HCT) 40-25 MG tablet TAKE 1 TABLET BY MOUTH EVERY DAY 90 tablet 1   rosuvastatin (CRESTOR) 20 MG tablet TAKE 1/2 TABLET BY MOUTH DAILY 45 tablet 3   triamcinolone cream (KENALOG) 0.1 % Apply 1 application topically as needed (skin irritation).  0   benzonatate (TESSALON PERLES) 100 MG capsule Take 1 capsule (100 mg total) by mouth 3 (three) times daily as needed. (Patient not taking: Reported on 03/14/2021) 20 capsule 0   metoprolol succinate (TOPROL XL) 25 MG 24 hr tablet Take 1 tablet (25 mg total) by mouth daily. (Patient not taking: Reported on 03/14/2021) 90 tablet 3   omeprazole (PRILOSEC) 10 MG capsule Take 10 mg by mouth daily. (Patient not taking: Reported on 03/14/2021)     propranolol (INDERAL) 10 MG tablet Take 1 tablet (10 mg total) by mouth 4 (four) times daily as needed. (Patient not taking: Reported on 03/14/2021) 90 tablet 3  timolol (TIMOPTIC) 0.5 % ophthalmic solution 1 drop 2 (two) times daily.     No current facility-administered medications on  file prior to visit.    Allergies  Allergen Reactions   Atorvastatin Other (See Comments)    MYALGIAS, LEG CRAMPING   Ezetimibe-Simvastatin Other (See Comments)    MYALGIAS, CRAMPING   Lisinopril Cough   Pravastatin Other (See Comments)    Myalgias at 40mg  dosage    Past Medical History:  Diagnosis Date   Allergy    Aortic root dilatation (HCC)    Mild by echo 11/2011 (61mm)   BPH (benign prostatic hypertrophy)    Bradycardia    Precluding BB use.   CAD (coronary artery disease)    a. s/p Taxus DES to RCA and PDA in 2006 .  // b. Myoview 8/11: Walked 12:37. EF 57%. no ischemia or scar.  // c. Mod CAD by cath 11/2011 (40-50% ISR of PDA -- for OP myoview to assess).  //  d. Myoview 3/18: EF 58, no ischemia; Low Risk   Colon polyps    Complication of anesthesia    "they said they about lost, me when I had eye surgery"   Diverticulitis    GERD (gastroesophageal reflux disease)    Glaucoma    Hyperlipidemia    Hypertension    Impaired fasting glucose    Sleep apnea    Syncope     Past Surgical History:  Procedure Laterality Date   ANGIOPLASTY     percutaneous transluminal coronary  angioplasty and stenting of the right coronary artery and posterior    CATARACT EXTRACTION W/ INTRAOCULAR LENS  IMPLANT, BILATERAL  9/15   COLONOSCOPY     INTRAOCULAR LENS INSERTION     LEFT HEART CATH AND CORONARY ANGIOGRAPHY N/A 10/14/2018   Procedure: LEFT HEART CATH AND CORONARY ANGIOGRAPHY;  Surgeon: Martinique, Peter M, MD;  Location: [facility];  Service: Cardiovascular;  Laterality: N/A;   LEFT HEART CATHETERIZATION WITH CORONARY ANGIOGRAM N/A 11/26/2011   Procedure: LEFT HEART CATHETERIZATION WITH CORONARY ANGIOGRAM;  Surgeon: Thayer Headings, MD;  Location: [facility] CATH LAB;  Service: Cardiovascular;  Laterality: N/A;   LUMBAR LAMINECTOMY/DECOMPRESSION MICRODISCECTOMY Left 05/27/2016   Procedure: Laminectomy for facet/synovial cyst - Lumbar four - Lumbar five - left;  Surgeon: Kary Kos, MD;   Location: [facility];  Service: Neurosurgery;  Laterality: Left;  Laminectomy for facet/synovial cyst - Lumbar four - Lumbar five - left   PILONIDAL CYST / SINUS EXCISION      Social History   Tobacco Use  Smoking Status Former   Years: 30.00   Types: Cigarettes   Quit date: 05/13/1978   Years since quitting: 42.8  Smokeless Tobacco Never    Social History   Substance and Sexual Activity  Alcohol Use Yes   Alcohol/week: 14.0 standard drinks   Types: 14 Glasses of wine per week    Family History  Problem Relation Age of Onset   Coronary artery disease Father    Heart disease Father    Heart failure Father    Cancer Mother        ovarian cancer   Stroke Son    Hypertension Neg Hx    Diabetes Neg Hx     Reviw of Systems:  Noted in current history, otherwise systems are negative.  Physical Exam: Blood pressure (!) 148/74, pulse 60, height 5\' 7"  (1.702 m), weight 182 lb 3.2 oz (82.6 kg), SpO2 96 %.  GEN:  Well nourished,  well developed in no acute distress HEENT: Normal NECK: No JVD; No carotid bruits LYMPHATICS: No lymphadenopathy CARDIAC: RRR , no murmurs, rubs, gallops RESPIRATORY:  Clear to auscultation without rales, wheezing or rhonchi  ABDOMEN: Soft, non-tender, non-distended MUSCULOSKELETAL:  No edema; No deformity  SKIN: Warm and dry NEUROLOGIC:  Alert and oriented x 3    ECG:    Nov. 2, 2022:  sinus brady at 56.  No ST or T wave changes.    Assessment / Plan:   1. Coronary artery disease-moderate.   No angina .    2. Hypertension-         3.  Hyperlipidemia:  managed by Dr. Silvio Pate  4. Vertigo :   will give script for meclizine.  He will also be seeing Dr. Silvio Pate tomorrow .   He may have additional evaluation .      Mertie Moores, MD  03/14/2021 4:45 PM    [address],  [place], [place]  [address] Pager [phone] Phone: [phone]; Fax: [phone]

## 2021-03-14 NOTE — Patient Instructions (Signed)
Medication Instructions:  1) You may take Meclizine 25mg  as needed for episodes of Vertigo/dizziness.  *If you need a refill on your cardiac medications before your next appointment, please call your pharmacy*   Lab Work: None If you have labs (blood work) drawn today and your tests are completely normal, you will receive your results only by: [place] (if you have MyChart) OR A paper copy in the mail If you have any lab test that is abnormal or we need to change your treatment, we will call you to review the results.   Testing/Procedures: None   Follow-Up: At [facility], you and your health needs are our priority.  As part of our continuing mission to provide you with exceptional heart care, we have created designated Provider Care Teams.  These Care Teams include your primary Cardiologist (physician) and Advanced Practice Providers (APPs -  Physician Assistants and Nurse Practitioners) who all work together to provide you with the care you need, when you need it.  We recommend signing up for the patient portal called "MyChart".  Sign up information is provided on this After Visit Summary.  MyChart is used to connect with patients for Virtual Visits (Telemedicine).  Patients are able to view lab/test results, encounter notes, upcoming appointments, etc.  Non-urgent messages can be sent to your provider as well.   To learn more about what you can do with MyChart, go to [email or web address].    Your next appointment:   Will be in July 2023  The format for your next appointment:   In Person  Provider:   You may see Mertie Moores, MD or one of the following Advanced Practice Providers on your designated Care Team:   Richardson Dopp, PA-C [place], [place]   Other Instructions

## 2021-03-15 ENCOUNTER — Ambulatory Visit (INDEPENDENT_AMBULATORY_CARE_PROVIDER_SITE_OTHER): Payer: Medicare Other | Admitting: Internal Medicine

## 2021-03-15 ENCOUNTER — Encounter: Payer: Self-pay | Admitting: Internal Medicine

## 2021-03-15 VITALS — BP 132/84 | HR 60 | Temp 98.2°F | Ht 67.0 in | Wt 182.0 lb

## 2021-03-15 DIAGNOSIS — N401 Enlarged prostate with lower urinary tract symptoms: Secondary | ICD-10-CM | POA: Diagnosis not present

## 2021-03-15 DIAGNOSIS — R42 Dizziness and giddiness: Secondary | ICD-10-CM | POA: Diagnosis not present

## 2021-03-15 DIAGNOSIS — K219 Gastro-esophageal reflux disease without esophagitis: Secondary | ICD-10-CM | POA: Diagnosis not present

## 2021-03-15 DIAGNOSIS — Z23 Encounter for immunization: Secondary | ICD-10-CM | POA: Diagnosis not present

## 2021-03-15 NOTE — Assessment & Plan Note (Signed)
Has some minor symptoms---mostly at night He is not excited about medications---if worsens, will try tamsulosin

## 2021-03-15 NOTE — Assessment & Plan Note (Signed)
2 very brief attacks last week No problems since then Nothing to suggest stroke or space occupying lesion Okay to try meclizine if recurs--but no action for now

## 2021-03-15 NOTE — Progress Notes (Signed)
Subjective:    Patient ID: Patrick [name], male    DOB: 04-18-1941, 80 y.o.   MRN: [id number]  HPI Here due to vertigo This visit occurred during the SARS-CoV-2 public health emergency.  Safety protocols were in place, including screening questions prior to the visit, additional usage of staff PPE, and extensive cleaning of exam room while observing appropriate contact time as indicated for disinfecting solutions.   Awoke last Monday----room was spinning Got spontaneous nosebleed Lied down for a while--then it went away  Then another spinning sensation 1 week ago Room was spinning--no more than 3 minutes No symptoms since then  Having some prostate problems Some pain with voiding at night---this has also improved Slow stream at night Stream is okay in day  Got Rx for meclizine but hasn't filled it No recent travel except for the beach (that is where the first spell occurred)   Current Outpatient Medications on File Prior to Visit  Medication Sig Dispense Refill   amLODipine (NORVASC) 5 MG tablet TAKE 1 TABLET BY MOUTH EVERY DAY 90 tablet 3   aspirin 81 MG tablet Take 81 mg by mouth daily.     cetirizine (ZYRTEC) 10 MG tablet Take 10 mg by mouth daily as needed for allergies.     Coenzyme Q10 (CO Q-10) 100 MG CAPS Take 1 capsule by mouth daily.     ketoconazole (NIZORAL) 2 % shampoo APPLY 1 APPLICATION TOPICALLY 2 (TWO) TIMES A WEEK. 120 mL 5   latanoprost (XALATAN) 0.005 % ophthalmic solution Place 1 drop into both eyes at bedtime.      meclizine (ANTIVERT) 25 MG tablet Take 1 tablet (25 mg total) by mouth 3 (three) times daily as needed for dizziness. 30 tablet 1   nitroGLYCERIN (NITROSTAT) 0.4 MG SL tablet Place 1 tablet (0.4 mg total) under the tongue every 5 (five) minutes x 3 doses as needed for chest pain. 25 tablet 6   olmesartan-hydrochlorothiazide (BENICAR HCT) 40-25 MG tablet TAKE 1 TABLET BY MOUTH EVERY DAY 90 tablet 1   rosuvastatin (CRESTOR) 20 MG tablet TAKE 1/2  TABLET BY MOUTH DAILY 45 tablet 3   timolol (TIMOPTIC) 0.5 % ophthalmic solution 1 drop 2 (two) times daily.     triamcinolone cream (KENALOG) 0.1 % Apply 1 application topically as needed (skin irritation).  0   omeprazole (PRILOSEC) 10 MG capsule Take 10 mg by mouth daily. (Patient not taking: Reported on 03/15/2021)     No current facility-administered medications on file prior to visit.    Allergies  Allergen Reactions   Atorvastatin Other (See Comments)    MYALGIAS, LEG CRAMPING   Ezetimibe-Simvastatin Other (See Comments)    MYALGIAS, CRAMPING   Lisinopril Cough   Pravastatin Other (See Comments)    Myalgias at 40mg  dosage    Past Medical History:  Diagnosis Date   Allergy    Aortic root dilatation (HCC)    Mild by echo 11/2011 (45mm)   BPH (benign prostatic hypertrophy)    Bradycardia    Precluding BB use.   CAD (coronary artery disease)    a. s/p Taxus DES to RCA and PDA in 2006 .  // b. Myoview 8/11: Walked 12:37. EF 57%. no ischemia or scar.  // c. Mod CAD by cath 11/2011 (40-50% ISR of PDA -- for OP myoview to assess).  //  d. Myoview 3/18: EF 58, no ischemia; Low Risk   Colon polyps    Complication of anesthesia    "they said  they about lost, me when I had eye surgery"   Diverticulitis    GERD (gastroesophageal reflux disease)    Glaucoma    Hyperlipidemia    Hypertension    Impaired fasting glucose    Sleep apnea    Syncope     Past Surgical History:  Procedure Laterality Date   ANGIOPLASTY     percutaneous transluminal coronary  angioplasty and stenting of the right coronary artery and posterior    CATARACT EXTRACTION W/ INTRAOCULAR LENS  IMPLANT, BILATERAL  9/15   COLONOSCOPY     INTRAOCULAR LENS INSERTION     LEFT HEART CATH AND CORONARY ANGIOGRAPHY N/A 10/14/2018   Procedure: LEFT HEART CATH AND CORONARY ANGIOGRAPHY;  Surgeon: Martinique, Peter M, MD;  Location: [place] [facility];  Service: Cardiovascular;  Laterality: N/A;   LEFT HEART CATHETERIZATION WITH  CORONARY ANGIOGRAM N/A 11/26/2011   Procedure: LEFT HEART CATHETERIZATION WITH CORONARY ANGIOGRAM;  Surgeon: Thayer Headings, MD;  Location: [facility];  Service: Cardiovascular;  Laterality: N/A;   LUMBAR LAMINECTOMY/DECOMPRESSION MICRODISCECTOMY Left 05/27/2016   Procedure: Laminectomy for facet/synovial cyst - Lumbar four - Lumbar five - left;  Surgeon: Kary Kos, MD;  Location: [place];  Service: Neurosurgery;  Laterality: Left;  Laminectomy for facet/synovial cyst - Lumbar four - Lumbar five - left   PILONIDAL CYST / SINUS EXCISION      Family History  Problem Relation Age of Onset   Coronary artery disease Father    Heart disease Father    Heart failure Father    Cancer Mother        ovarian cancer   Stroke Son    Hypertension Neg Hx    Diabetes Neg Hx     Social History   Socioeconomic History   Marital status: Divorced    Spouse name: Not on file   Number of children: 3   Years of education: Not on file   Highest education level: Not on file  Occupational History   Occupation: driver part time    Employer: retired  Tobacco Use   Smoking status: Former    Years: 30.00    Types: Cigarettes    Quit date: 05/13/1978    Years since quitting: 42.8   Smokeless tobacco: Never  Vaping Use   Vaping Use: Never used  Substance and Sexual Activity   Alcohol use: Yes    Alcohol/week: 14.0 standard drinks    Types: 14 Glasses of wine per week   Drug use: No   Sexual activity: Yes  Other Topics Concern   Not on file  Social History Narrative   Has living will   Daughter Benjamine Mola should make medical decisions if he can't   Would accept resuscitation   Wouldn't want prolonged tube feeds if cognitively unaware   Social Determinants of Health   Financial Resource Strain: Not on file  Food Insecurity: Not on file  Transportation Needs: Not on file  Physical Activity: Not on file  Stress: Not on file  Social Connections: Not on file  Intimate Partner Violence: Not on file    Review of Systems No hearing changes Chronic tinnitus No recent cold symptoms or fever    Objective:   Physical Exam Constitutional:      Appearance: Normal appearance.  HENT:     Right Ear: Tympanic membrane and ear canal normal.     Left Ear: Tympanic membrane and ear canal normal.  Eyes:     Extraocular Movements: Extraocular movements  intact.     Comments: No nystagmus  Neurological:     Mental Status: He is alert and oriented to person, place, and time.     Cranial Nerves: Cranial nerves 2-12 are intact.     Motor: No weakness, tremor or abnormal muscle tone.     Coordination: Romberg sign negative. Coordination normal.     Gait: Gait normal.           Assessment & Plan:

## 2021-03-15 NOTE — Telephone Encounter (Signed)
Patient had office visit with Dr. [name] on 11/2.

## 2021-03-15 NOTE — Assessment & Plan Note (Signed)
Did try lowering omeprazole dose--to OTC 10mg  Some acid rebound at first--but went away Has since been able to wean off---does okay if he doesn't eat too late

## 2021-03-27 ENCOUNTER — Other Ambulatory Visit: Payer: Self-pay | Admitting: Cardiovascular Disease

## 2021-05-29 DIAGNOSIS — H401130 Primary open-angle glaucoma, bilateral, stage unspecified: Secondary | ICD-10-CM | POA: Diagnosis not present

## 2021-07-02 DIAGNOSIS — H353112 Nonexudative age-related macular degeneration, right eye, intermediate dry stage: Secondary | ICD-10-CM | POA: Diagnosis not present

## 2021-07-02 DIAGNOSIS — H0100A Unspecified blepharitis right eye, upper and lower eyelids: Secondary | ICD-10-CM | POA: Diagnosis not present

## 2021-07-02 DIAGNOSIS — H43813 Vitreous degeneration, bilateral: Secondary | ICD-10-CM | POA: Diagnosis not present

## 2021-07-02 DIAGNOSIS — H401132 Primary open-angle glaucoma, bilateral, moderate stage: Secondary | ICD-10-CM | POA: Diagnosis not present

## 2021-07-04 ENCOUNTER — Other Ambulatory Visit: Payer: Self-pay | Admitting: Cardiovascular Disease

## 2021-08-01 ENCOUNTER — Other Ambulatory Visit: Payer: Self-pay | Admitting: Internal Medicine

## 2021-08-16 ENCOUNTER — Ambulatory Visit (INDEPENDENT_AMBULATORY_CARE_PROVIDER_SITE_OTHER): Payer: Medicare Other

## 2021-08-16 ENCOUNTER — Encounter: Payer: Self-pay | Admitting: Podiatry

## 2021-08-16 ENCOUNTER — Telehealth: Payer: Self-pay | Admitting: Cardiovascular Disease

## 2021-08-16 ENCOUNTER — Ambulatory Visit: Payer: Medicare Other | Admitting: Podiatry

## 2021-08-16 DIAGNOSIS — M2012 Hallux valgus (acquired), left foot: Secondary | ICD-10-CM | POA: Diagnosis not present

## 2021-08-16 DIAGNOSIS — M2010 Hallux valgus (acquired), unspecified foot: Secondary | ICD-10-CM

## 2021-08-16 DIAGNOSIS — M2011 Hallux valgus (acquired), right foot: Secondary | ICD-10-CM | POA: Diagnosis not present

## 2021-08-16 NOTE — Progress Notes (Signed)
?Subjective:  ?Patient ID: Patrick [name], male    DOB: 12/22/1940,  MRN: [id number] ?HPI ?Chief Complaint  ?Patient presents with  ? Foot Pain  ?  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 New Patient (Initial Visit)  ? ? ?81 y.o. male presents with the above complaint.  ? ?ROS: Denies fever chills nausea vomiting muscle aches pains calf pain back pain chest pain shortness of breath. ? ?Past Medical History:  ?Diagnosis Date  ? Allergy   ? Aortic root dilatation (HCC)   ? Mild by echo 11/2011 (36m)  ? BPH (benign prostatic hypertrophy)   ? Bradycardia   ? Precluding BB use.  ? CAD (coronary artery disease)   ? a. s/p Taxus DES to RCA and PDA in 2006 .  // b. Myoview 8/11: Walked 12:37. EF 57%. no ischemia or scar.  // c. Mod CAD by cath 11/2011 (40-50% ISR of PDA -- for OP myoview to assess).  //  d. Myoview 3/18: EF 58, no ischemia; Low Risk  ? Colon polyps   ? Complication of anesthesia   ? "they said they about lost, me when I had eye surgery"  ? Diverticulitis   ? GERD (gastroesophageal reflux disease)   ? Glaucoma   ? Hyperlipidemia   ? Hypertension   ? Impaired fasting glucose   ? Sleep apnea   ? Syncope   ? ?Past Surgical History:  ?Procedure Laterality Date  ? ANGIOPLASTY    ? percutaneous transluminal coronary  angioplasty and stenting of the right coronary artery and posterior   ? CATARACT EXTRACTION W/ INTRAOCULAR LENS  IMPLANT, BILATERAL  9/15  ? COLONOSCOPY    ? INTRAOCULAR LENS INSERTION    ? LEFT HEART CATH AND CORONARY ANGIOGRAPHY N/A 10/14/2018  ? Procedure: LEFT HEART CATH AND CORONARY ANGIOGRAPHY;  Surgeon: JMartinique Peter M, MD;  [facility];  Service: Cardiovascular;  Laterality: N/A;  ? LEFT HEART CATHETERIZATION WITH CORONARY ANGIOGRAM N/A 11/26/2011  ? Procedure: LEFT HEART CATHETERIZATION WITH CORONARY ANGIOGRAM;  Surgeon: PThayer Headings MD;  [facility];  Service: Cardiovascular;  Laterality: N/A;  ?  LUMBAR LAMINECTOMY/DECOMPRESSION MICRODISCECTOMY Left 05/27/2016  ? Procedure: Laminectomy for facet/synovial cyst - Lumbar four - Lumbar five - left;  Surgeon: GKary Kos MD;  [place]  Service: Neurosurgery;  Laterality: Left;  Laminectomy for facet/synovial cyst - Lumbar four - Lumbar five - left  ? PILONIDAL CYST / SINUS EXCISION    ? ? ?Current Outpatient Medications:  ?  amLODipine (NORVASC) 5 MG tablet, TAKE 1 TABLET BY MOUTH EVERY DAY, Disp: 90 tablet, Rfl: 3 ?  aspirin 81 MG tablet, Take 81 mg by mouth daily., Disp: , Rfl:  ?  cetirizine (ZYRTEC) 10 MG tablet, Take 10 mg by mouth daily as needed for allergies., Disp: , Rfl:  ?  Coenzyme Q10 (CO Q-10) 100 MG CAPS, Take 1 capsule by mouth daily., Disp: , Rfl:  ?  ketoconazole (NIZORAL) 2 % shampoo, APPLY 1 APPLICATION TOPICALLY 2 (TWO) TIMES A WEEK., Disp: 120 mL, Rfl: 5 ?  latanoprost (XALATAN) 0.005 % ophthalmic solution, Place 1 drop into both eyes at bedtime. , Disp: , Rfl:  ?  nitroGLYCERIN (NITROSTAT) 0.4 MG SL tablet, Place 1 tablet (0.4 mg total) under the tongue every 5 (five) minutes x 3 doses as needed for chest pain., Disp: 25 tablet, Rfl: 6 ?  olmesartan-hydrochlorothiazide (  BENICAR HCT) 40-25 MG tablet, TAKE 1 TABLET BY MOUTH EVERY DAY, Disp: 90 tablet, Rfl: 3 ?  rosuvastatin (CRESTOR) 20 MG tablet, TAKE 1/2 TABLET BY MOUTH EVERY DAY, Disp: 45 tablet, Rfl: 2 ?  timolol (TIMOPTIC) 0.5 % ophthalmic solution, 1 drop 2 (two) times daily., Disp: , Rfl:  ?  triamcinolone cream (KENALOG) 0.1 %, Apply 1 application topically as needed (skin irritation)., Disp: , Rfl: 0 ? ?Allergies  ?Allergen Reactions  ? Atorvastatin Other (See Comments)  ?  MYALGIAS, LEG CRAMPING  ? Ezetimibe-Simvastatin Other (See Comments)  ?  MYALGIAS, CRAMPING  ? Lisinopril Cough  ? Pravastatin Other (See Comments)  ?  Myalgias at '40mg'$  dosage  ? ?Review of Systems ?Objective:  ?There were no vitals filed for this visit. ? ?General: Well developed, nourished, in no acute  distress, alert and oriented x3  ? ?Dermatological: Skin is warm, dry and supple bilateral. Nails x 10 are well maintained; remaining integument appears unremarkable at this time. There are no open sores, no preulcerative lesions, no rash or signs of infection present. ? ?Vascular: Dorsalis Pedis artery and Posterior Tibial artery pedal pulses are 2/4 bilateral with immedate capillary fill time. Pedal hair growth present. No varicosities and no lower extremity edema present bilateral.  ? ?Neruologic: Grossly intact via light touch bilateral. Vibratory intact via tuning fork bilateral. Protective threshold with Semmes Wienstein monofilament intact to all pedal sites bilateral. Patellar and Achilles deep tendon reflexes 2+ bilateral. No Babinski or clonus noted bilateral.  ? ?Musculoskeletal: No gross boney pedal deformities bilateral. No pain, crepitus, or limitation noted with foot and ankle range of motion bilateral. Muscular strength 5/5 in all groups tested bilateral.  Hallux abductovalgus deformity with limited range of motion dorsiflexion and plantarflexion.  Minimally reducible.  Is not hypermobile at the first TMT joint. ? ?Gait: Unassisted, Nonantalgic.  ? ? ?Radiographs: ? ?Radiographs taken today demonstrate moderate hallux abductovalgus deformities with joint space narrowing subchondral sclerosis eburnation and tibial sesamoid position of 4.  This is noted bilaterally there is some dorsal spurring noted.  There is some mild elevation of the second toe at the level of the metatarsal phalangeal joint. ? ?Assessment & Plan:  ? 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  Bilateral.  Left greater than right. ? ?Plan: Discussed etiology pathology conservative versus surgical therapies.  At this point were going to plan on performing the procedures 2 to 3 weeks apart.  We consented him today for a bunion Austin type osteotomy with screws bilaterally.  He  understands this and is amenable to it.  We did discuss possible postop complications which may include but not limited to postop pain bleeding swelling infection recurrence need for further surgery overcorrection under correction loss of digit loss of limb loss of life.  We also performed a release of the second metatarsal phalangeal joint. ? ? ? ? ?Rana Adorno T. [place], DPM ?

## 2021-08-16 NOTE — Telephone Encounter (Signed)
? ?  Pre-operative Risk Assessment  ?  ?Patient Name: Patrick [name]  ?DOB: Nov 08, 1940 ?MRN: [id number]  ? ?  ? ?Request for Surgical Clearance   ? ?Procedure:  5/19- Austin bunionectomy and  release of the 2 toe (MTPJ) left foot and 6/9 same procedure on the right foot  ? ?Date of Surgery:  Clearance  ?   ?Surgeon:  Dr Milinda Pointer  ?Surgeon's Group or Practice Name:  [facility]  ?Phone number:  [phone] ?Fax number:  [phone] ?  ?Type of Clearance Requested:   ?Both and what meds to hold  ?  ?Type of Anesthesia: Anesthesia Choice  ?  ?Additional requests/questions:   ? ?Signed, ?Shana A Stovall   ?08/16/2021, 2:17 PM   ?

## 2021-08-17 ENCOUNTER — Telehealth: Payer: Self-pay | Admitting: *Deleted

## 2021-08-17 NOTE — Telephone Encounter (Signed)
Pt is agreeable to plan of care for tele pre op appt 08/21/21 @ 9 am. Med rec and consent are done ? ?  ?Patient Consent for Virtual Visit  ? ? ?   ? ?Macintyre N Janus has provided verbal consent on 08/17/2021 for a virtual visit (video or telephone). ? ? ?CONSENT FOR VIRTUAL VISIT FOR:  Patrick [name]  ?By participating in this virtual visit I agree to the following: ? ?I hereby voluntarily request, consent and authorize [facility] and its employed or contracted physicians, physician assistants, nurse practitioners or other licensed health care professionals (the Practitioner), to provide me with telemedicine health care services (the ?Services") as deemed necessary by the treating Practitioner. I acknowledge and consent to receive the Services by the Practitioner via telemedicine. I understand that the telemedicine visit will involve communicating with the Practitioner through live audiovisual communication technology and the disclosure of certain medical information by electronic transmission. I acknowledge that I have been given the opportunity to request an in-person assessment or other available alternative prior to the telemedicine visit and am voluntarily participating in the telemedicine visit. ? ?I understand that I have the right to withhold or withdraw my consent to the use of telemedicine in the course of my care at any time, without affecting my right to future care or treatment, and that the Practitioner or I may terminate the telemedicine visit at any time. I understand that I have the right to inspect all information obtained and/or recorded in the course of the telemedicine visit and may receive copies of available information for a reasonable fee.  I understand that some of the potential risks of receiving the Services via telemedicine include:  ?Delay or interruption in medical evaluation due to technological equipment failure or disruption; ?Information transmitted may not be sufficient (e.g.  poor resolution of images) to allow for appropriate medical decision making by the Practitioner; and/or  ?In rare instances, security protocols could fail, causing a breach of personal health information. ? ?Furthermore, I acknowledge that it is my responsibility to provide information about my medical history, conditions and care that is complete and accurate to the best of my ability. I acknowledge that Practitioner's advice, recommendations, and/or decision may be based on factors not within their control, such as incomplete or inaccurate data provided by me or distortions of diagnostic images or specimens that may result from electronic transmissions. I understand that the practice of medicine is not an exact science and that Practitioner makes no warranties or guarantees regarding treatment outcomes. I acknowledge that a copy of this consent can be made available to me via my patient portal ([facility]), or I can request a printed copy by calling the office of [facility].   ? ?I understand that my insurance will be billed for this visit.  ? ?I have read or had this consent read to me. ?I understand the contents of this consent, which adequately explains the benefits and risks of the Services being provided via telemedicine.  ?I have been provided ample opportunity to ask questions regarding this consent and the Services and have had my questions answered to my satisfaction. ?I give my informed consent for the services to be provided through the use of telemedicine in my medical care ? ? ? ?

## 2021-08-17 NOTE — Telephone Encounter (Signed)
? ?  Patient Name: Patrick [name]  ?DOB: Oct 21, 1940 ?MRN: [id number] ? ?Primary Cardiologist: Mertie Moores, MD ? ?Chart reviewed as part of pre-operative protocol coverage.  ? ?Preoperative team, please contact this patient and set up a phone call appointment for further cardiac evaluation.   ? ?Thank you for your help. ? ?Patient takes aspirin 81 mg daily.  Ideally, patient will continue aspirin 81 mg daily through the perioperative period.  However if aspirin needs to be held prior to surgery, the requesting party should notify our office. ? ?Lenna Sciara, NP ?08/17/2021, 12:30 PM ? ? ?

## 2021-08-17 NOTE — Telephone Encounter (Signed)
Pt is agreeable to plan of care for tele pre op appt 08/21/21 @ 9 am. Med rec and consent are done.  ? ?  ?Patient Consent for Virtual Visit  ? ? ?   ? ?Patrick [name] has provided verbal consent on 08/17/2021 for a virtual visit (video or telephone). ? ? ?CONSENT FOR VIRTUAL VISIT FOR:  Patrick [name]  ?By participating in this virtual visit I agree to the following: ? ?I hereby voluntarily request, consent and authorize [facility] and its employed or contracted physicians, physician assistants, nurse practitioners or other licensed health care professionals (the Practitioner), to provide me with telemedicine health care services (the ?Services") as deemed necessary by the treating Practitioner. I acknowledge and consent to receive the Services by the Practitioner via telemedicine. I understand that the telemedicine visit will involve communicating with the Practitioner through live audiovisual communication technology and the disclosure of certain medical information by electronic transmission. I acknowledge that I have been given the opportunity to request an in-person assessment or other available alternative prior to the telemedicine visit and am voluntarily participating in the telemedicine visit. ? ?I understand that I have the right to withhold or withdraw my consent to the use of telemedicine in the course of my care at any time, without affecting my right to future care or treatment, and that the Practitioner or I may terminate the telemedicine visit at any time. I understand that I have the right to inspect all information obtained and/or recorded in the course of the telemedicine visit and may receive copies of available information for a reasonable fee.  I understand that some of the potential risks of receiving the Services via telemedicine include:  ?Delay or interruption in medical evaluation due to technological equipment failure or disruption; ?Information transmitted may not be sufficient (e.g.  poor resolution of images) to allow for appropriate medical decision making by the Practitioner; and/or  ?In rare instances, security protocols could fail, causing a breach of personal health information. ? ?Furthermore, I acknowledge that it is my responsibility to provide information about my medical history, conditions and care that is complete and accurate to the best of my ability. I acknowledge that Practitioner's advice, recommendations, and/or decision may be based on factors not within their control, such as incomplete or inaccurate data provided by me or distortions of diagnostic images or specimens that may result from electronic transmissions. I understand that the practice of medicine is not an exact science and that Practitioner makes no warranties or guarantees regarding treatment outcomes. I acknowledge that a copy of this consent can be made available to me via my patient portal ([facility]), or I can request a printed copy by calling the office of [facility].   ? ?I understand that my insurance will be billed for this visit.  ? ?I have read or had this consent read to me. ?I understand the contents of this consent, which adequately explains the benefits and risks of the Services being provided via telemedicine.  ?I have been provided ample opportunity to ask questions regarding this consent and the Services and have had my questions answered to my satisfaction. ?I give my informed consent for the services to be provided through the use of telemedicine in my medical care ? ? ? ?

## 2021-08-21 ENCOUNTER — Encounter: Payer: Self-pay | Admitting: Nurse Practitioner

## 2021-08-21 ENCOUNTER — Ambulatory Visit (INDEPENDENT_AMBULATORY_CARE_PROVIDER_SITE_OTHER): Payer: Medicare Other | Admitting: Nurse Practitioner

## 2021-08-21 VITALS — BP 144/72 | HR 54

## 2021-08-21 DIAGNOSIS — Z01810 Encounter for preprocedural cardiovascular examination: Secondary | ICD-10-CM | POA: Diagnosis not present

## 2021-08-21 NOTE — Progress Notes (Signed)
? ?Virtual Visit via Telephone Note  ? ?This visit type was conducted due to national recommendations for restrictions regarding the COVID-19 Pandemic (e.g. social distancing) in an effort to limit this patient's exposure and mitigate transmission in our community.  Due to his co-morbid illnesses, this patient is at least at moderate risk for complications without adequate follow up.  This format is felt to be most appropriate for this patient at this time.  The patient did not have access to video technology/had technical difficulties with video requiring transitioning to audio format only (telephone).  All issues noted in this document were discussed and addressed.  No physical exam could be performed with this format.  Please refer to the patient's chart for his  consent to telehealth for [facility]. ? ?Evaluation Performed:  Preoperative cardiovascular risk assessment ?_____________  ? ?Date:  08/21/2021  ? ?Patient ID:  Patrick [name], DOB [birth year], MRN [id number] ?Patient Location:  ?Home ?Provider location:   ?Office ? ?Primary Care Provider:  Venia Carbon, MD ?Primary Cardiologist:  Mertie Moores, MD ? ?Chief Complaint  ?  ?81 y.o. y/o male with a h/o CAD with prior DES to RCA and PDA in 2006, hypertension, hyperlipidemia, dilated aortic rot, bradycardia who is pending foot surgery, and presents today for telephonic preoperative cardiovascular risk assessment. ? ?Past Medical History  ?  ?Past Medical History:  ?Diagnosis Date  ? Allergy   ? Aortic root dilatation (HCC)   ? Mild by echo 11/2011 (74m)  ? BPH (benign prostatic hypertrophy)   ? Bradycardia   ? Precluding BB use.  ? CAD (coronary artery disease)   ? a. s/p Taxus DES to RCA and PDA in 2006 .  // b. Myoview 8/11: Walked 12:37. EF 57%. no ischemia or scar.  // c. Mod CAD by cath 11/2011 (40-50% ISR of PDA -- for OP myoview to assess).  //  d. Myoview 3/18: EF 58, no ischemia; Low Risk  ? Colon polyps   ? Complication of anesthesia   ? "they  said they about lost, me when I had eye surgery"  ? Diverticulitis   ? GERD (gastroesophageal reflux disease)   ? Glaucoma   ? Hyperlipidemia   ? Hypertension   ? Impaired fasting glucose   ? Sleep apnea   ? Syncope   ? ?Past Surgical History:  ?Procedure Laterality Date  ? ANGIOPLASTY    ? percutaneous transluminal coronary  angioplasty and stenting of the right coronary artery and posterior   ? CATARACT EXTRACTION W/ INTRAOCULAR LENS  IMPLANT, BILATERAL  9/15  ? COLONOSCOPY    ? INTRAOCULAR LENS INSERTION    ? LEFT HEART CATH AND CORONARY ANGIOGRAPHY N/A 10/14/2018  ? Procedure: LEFT HEART CATH AND CORONARY ANGIOGRAPHY;  Surgeon: JMartinique Peter M, MD;  [facility];  Service: Cardiovascular;  Laterality: N/A;  ? LEFT HEART CATHETERIZATION WITH CORONARY ANGIOGRAM N/A 11/26/2011  ? Procedure: LEFT HEART CATHETERIZATION WITH CORONARY ANGIOGRAM;  Surgeon: PThayer Headings MD;  [facility];  Service: Cardiovascular;  Laterality: N/A;  ? LUMBAR LAMINECTOMY/DECOMPRESSION MICRODISCECTOMY Left 05/27/2016  ? Procedure: Laminectomy for facet/synovial cyst - Lumbar four - Lumbar five - left;  Surgeon: GKary Kos MD;  [facility]  Service: Neurosurgery;  Laterality: Left;  Laminectomy for facet/synovial cyst - Lumbar four - Lumbar five - left  ? PILONIDAL CYST / SINUS EXCISION    ? ? ?Allergies ? ?Allergies  ?Allergen Reactions  ? Atorvastatin Other (See  Comments)  ?  MYALGIAS, LEG CRAMPING  ? Ezetimibe-Simvastatin Other (See Comments)  ?  MYALGIAS, CRAMPING  ? Lisinopril Cough  ? Pravastatin Other (See Comments)  ?  Myalgias at '40mg'$  dosage  ? ? ?History of Present Illness  ?  ?Patrick [name] is a 81 y.o. male who presents via Engineer, civil (consulting) for a telehealth visit today.  Pt was last seen in cardiology clinic on 03/14/21 by Dr. [name].  At that time [facility] was doing well.  The patient is now pending [place] bunionectomy and release of 2 toe (MTPJ) left foot followed by same procedure  on right foot on June 9.  Since his last visit, he denies chest pain, dyspnea, presyncope, syncope, edema. He has had no further episodes of dizziness. No bleeding concerns on aspirin.  ? ? ?Home Medications  ?  ?Prior to Admission medications   ?Medication Sig Start Date End Date Taking? Authorizing Provider  ?amLODipine (NORVASC) 5 MG tablet TAKE 1 TABLET BY MOUTH EVERY DAY 09/28/20   Viviana Simpler I, MD  ?aspirin 81 MG tablet Take 81 mg by mouth daily.    [provider]  ?cetirizine (ZYRTEC) 10 MG tablet Take 10 mg by mouth daily as needed for allergies.    [provider]  ?Coenzyme Q10 (CO Q-10) 100 MG CAPS Take 1 capsule by mouth daily.    [provider]  ?ketoconazole (NIZORAL) 2 % shampoo APPLY 1 APPLICATION TOPICALLY 2 (TWO) TIMES A WEEK. 08/02/21   Venia Carbon, MD  ?latanoprost (XALATAN) 0.005 % ophthalmic solution Place 1 drop into both eyes at bedtime.  01/24/14   [provider]  ?nitroGLYCERIN (NITROSTAT) 0.4 MG SL tablet Place 1 tablet (0.4 mg total) under the tongue every 5 (five) minutes x 3 doses as needed for chest pain. 10/12/18   Nahser, Wonda Cheng, MD  ?olmesartan-hydrochlorothiazide [facility] HCT) 40-25 MG tablet TAKE 1 TABLET BY MOUTH EVERY DAY 03/27/21   Nahser, Wonda Cheng, MD  ?rosuvastatin (CRESTOR) 20 MG tablet TAKE 1/2 TABLET BY MOUTH EVERY DAY 07/04/21   Nahser, Wonda Cheng, MD  ?timolol (TIMOPTIC) 0.5 % ophthalmic solution 1 drop 2 (two) times daily. 02/15/21   [provider]  ?triamcinolone cream (KENALOG) 0.1 % Apply 1 application topically as needed (skin irritation). 11/06/17   [provider]  ? ? ?Physical Exam  ?  ?Vital Signs:  BP 144/72, pulse 54 bpm ? ?Given telephonic nature of communication, physical exam is limited. ?AAOx3. NAD. Normal affect.  Speech and respirations are unlabored. ? ?Accessory Clinical Findings  ?  ?None ? ?Assessment & Plan  ?  ?1.  Preoperative Cardiovascular Risk Assessment: Patient is doing well from a  cardiac perspective and may proceed to surgery without further cardiac testing. Patient takes aspirin 81 mg daily.  Ideally, patient will continue aspirin 81 mg daily through the perioperative period.  However if aspirin needs to be held prior to surgery, the requesting party should notify our office. According to the Revised Cardiac Risk Index (RCRI), his Perioperative Risk of Major Cardiac Event is (%): 0.4 ?His Functional Capacity in METs is: 7.59 according to the Duke Activity Status Index (DASI). ? ? ?A copy of this note will be routed to requesting surgeon. ? ?Time:   ?Today, I have spent 10 minutes with the patient with telehealth technology discussing medical history, symptoms, and management plan.   ? ? ?Louine Tenpenny, Lanice Schwab, NP ? ?08/21/2021, 8:59 AM ? ?

## 2021-08-30 ENCOUNTER — Telehealth: Payer: Self-pay

## 2021-08-30 NOTE — Telephone Encounter (Signed)
DOS 09/28/2021 ? ?[place] BUNIONECTOMY LT - 70929 ?CAPSULOTOMY, MPJ RELEASE 2ND LT - 57473 ? ?[employer]HC EFFECTIVE DATE - 05/13/2021 ? ?PLAN DEDUCTIBLE - $0.00 ?OUT OF POCKET - $3600.00 W/ $3486.91 REMAINING ? ?CO-INSURANCE ?0% / Day OUTPATIENT SURGERY ?0% / [place] ?COPAY ?$295 / Day OUTPATIENT SURGERY ?$4 / [place] ? ?Notification or Prior Authorization is not required for the requested services ? ?This [employer] Medicare Advantage members plan does not currently require a prior authorization for these services. If you have general questions about the prior authorization requirements, please call us at [phone] or visit [email or web address] > Clinician Resources > Advance and Admission Notification Requirements. The number above acknowledges your notification. Please write this number down for future reference. Notification is not a guarantee of coverage or payment. ? ?Decision ID #:[id number] ? ?

## 2021-09-11 ENCOUNTER — Ambulatory Visit (INDEPENDENT_AMBULATORY_CARE_PROVIDER_SITE_OTHER): Payer: Medicare Other | Admitting: Internal Medicine

## 2021-09-11 ENCOUNTER — Telehealth: Payer: Self-pay | Admitting: Cardiovascular Disease

## 2021-09-11 ENCOUNTER — Encounter: Payer: Self-pay | Admitting: Internal Medicine

## 2021-09-11 VITALS — BP 130/82 | HR 44 | Temp 97.6°F | Ht 68.0 in | Wt 182.0 lb

## 2021-09-11 DIAGNOSIS — Z0000 Encounter for general adult medical examination without abnormal findings: Secondary | ICD-10-CM

## 2021-09-11 DIAGNOSIS — I77810 Thoracic aortic ectasia: Secondary | ICD-10-CM

## 2021-09-11 DIAGNOSIS — M21611 Bunion of right foot: Secondary | ICD-10-CM | POA: Insufficient documentation

## 2021-09-11 DIAGNOSIS — I2510 Atherosclerotic heart disease of native coronary artery without angina pectoris: Secondary | ICD-10-CM | POA: Diagnosis not present

## 2021-09-11 DIAGNOSIS — R001 Bradycardia, unspecified: Secondary | ICD-10-CM

## 2021-09-11 DIAGNOSIS — N1831 Chronic kidney disease, stage 3a: Secondary | ICD-10-CM

## 2021-09-11 DIAGNOSIS — I2584 Coronary atherosclerosis due to calcified coronary lesion: Secondary | ICD-10-CM | POA: Diagnosis not present

## 2021-09-11 DIAGNOSIS — K219 Gastro-esophageal reflux disease without esophagitis: Secondary | ICD-10-CM

## 2021-09-11 DIAGNOSIS — E785 Hyperlipidemia, unspecified: Secondary | ICD-10-CM | POA: Diagnosis not present

## 2021-09-11 DIAGNOSIS — M21612 Bunion of left foot: Secondary | ICD-10-CM | POA: Diagnosis not present

## 2021-09-11 LAB — HEPATIC FUNCTION PANEL
ALT: 17 U/L (ref 0–53)
AST: 19 U/L (ref 0–37)
Albumin: 4.6 g/dL (ref 3.5–5.2)
Alkaline Phosphatase: 76 U/L (ref 39–117)
Bilirubin, Direct: 0.1 mg/dL (ref 0.0–0.3)
Total Bilirubin: 0.6 mg/dL (ref 0.2–1.2)
Total Protein: 7.7 g/dL (ref 6.0–8.3)

## 2021-09-11 LAB — LIPID PANEL
Cholesterol: 161 mg/dL (ref 0–200)
HDL: 50.3 mg/dL (ref >39.00)
LDL Cholesterol: 72 mg/dL (ref 0–99)
NonHDL: 110.49
Total CHOL/HDL Ratio: 3
Triglycerides: 192 mg/dL — ABNORMAL HIGH (ref 0.0–149.0)
VLDL: 38.4 mg/dL (ref 0.0–40.0)

## 2021-09-11 LAB — RENAL FUNCTION PANEL
Albumin: 4.6 g/dL (ref 3.5–5.2)
BUN: 22 mg/dL (ref 6–23)
CO2: 29 mEq/L (ref 19–32)
Calcium: 9.6 mg/dL (ref 8.4–10.5)
Chloride: 101 mEq/L (ref 96–112)
Creatinine, Ser: 1.44 mg/dL (ref 0.40–1.50)
GFR: 45.79 mL/min — ABNORMAL LOW (ref >60.00)
Glucose, Bld: 101 mg/dL — ABNORMAL HIGH (ref 70–99)
Phosphorus: 2.7 mg/dL (ref 2.3–4.6)
Potassium: 4.6 mEq/L (ref 3.5–5.1)
Sodium: 137 mEq/L (ref 135–145)

## 2021-09-11 LAB — CBC
HCT: 44.7 % (ref 39.0–52.0)
Hemoglobin: 15.1 g/dL (ref 13.0–17.0)
MCHC: 33.7 g/dL (ref 30.0–36.0)
MCV: 93 fl (ref 78.0–100.0)
Platelets: 272 10*3/uL (ref 150.0–400.0)
RBC: 4.8 Mil/uL (ref 4.22–5.81)
RDW: 13.6 % (ref 11.5–15.5)
WBC: 6.9 10*3/uL (ref 4.0–10.5)

## 2021-09-11 NOTE — Telephone Encounter (Signed)
Attempted to call patient back regarding earlier message about HR of 44. No answer, left message to return call to office for additional details. Per Dr Alla German office note, pt was asymptomatic with HR of 44. Pt has hx of bradycardia-not on any beta blockers or antiarrhythmic drugs. [place] did EKG and noted Sinus brady. Pt's vitals were stable (BP 130/82). Will reach back out to patient if no return call received today. ?

## 2021-09-11 NOTE — Assessment & Plan Note (Signed)
No symptoms ?Discussed that he may want to rethink the surgery ?

## 2021-09-11 NOTE — Progress Notes (Signed)
Hearing Screening - Comments:: Passed whisper test Vision Screening - Comments:: November 2022  

## 2021-09-11 NOTE — Assessment & Plan Note (Signed)
I have personally reviewed the Medicare Annual Wellness questionnaire and have noted ?1. The patient's medical and social history ?2. Their use of alcohol, tobacco or illicit drugs ?3. Their current medications and supplements ?4. The patient's functional ability including ADL's, fall risks, home safety risks and hearing or visual ?            impairment. ?5. Diet and physical activities ?6. Evidence for depression or mood disorders ? ?The patients weight, height, BMI and visual acuity have been recorded in the chart ?I have made referrals, counseling and provided education to the patient based review of the above and I have provided the pt with a written personalized care plan for preventive services. ? ?I have provided you with a copy of your personalized plan for preventive services. Please take the time to review along with your updated medication list. ? ?No cancer screening due to age ?Exercises regularly ?Flu vaccine in the fall--and COVID booster if surge, or by the fall ?UTD otherwise ?

## 2021-09-11 NOTE — Telephone Encounter (Signed)
Currently has appt scheduled on 11/19/21. ?

## 2021-09-11 NOTE — Assessment & Plan Note (Signed)
Borderline GFR ?Is on the olmesartan ?

## 2021-09-11 NOTE — Assessment & Plan Note (Signed)
Mild ectasia ?Is on the rosuvastatin ?

## 2021-09-11 NOTE — Telephone Encounter (Signed)
Patient was at Dr. Silvio Pate office this morning for his yearly physical.  Patient states his heart rate was at 44.  He wants to know if Dr. [name] would like to see him or do something about it.   ?

## 2021-09-11 NOTE — Assessment & Plan Note (Signed)
Quiet on ASA 81, rosuvastatin 20, olmesartan/HCTZ 40/25 ?Doesn't need nitro--discussed 911 if suddenly has chest pain ?

## 2021-09-11 NOTE — Assessment & Plan Note (Signed)
Rare symptoms ?No meds ?

## 2021-09-11 NOTE — Assessment & Plan Note (Addendum)
No parenteral beta blocker---just timolol drops ?Will check EKG ? ?EKG shows sinus brady at 45. No ischemic changes or hypertrophy. No change from 11/22--except rate lower ?No action needed ?

## 2021-09-11 NOTE — Progress Notes (Signed)
? ?Subjective:  ? ? Patient ID: Patrick [name], male    DOB: 11-Jan-1941, 81 y.o.   MRN: [id number] ? ?HPI ?Here for Medicare wellness visit and follow up of chronic health conditions ?Reviewed advanced directives ?Reviewed other doctors----Dr Nahser--cardiology, Dr Hayden Pedro, Dentist in [place], Dr Rutherford Nail ?No surgery or hospitalizations in the past year ?Still enjoys wine with dinner ?No tobacco ?On 2 drops now for glaucoma ?Hearing is fair--occasional problems ?No falls ?Exercises regularly--mostly walking ?No depression or anhedonia ?Independent with instrumental ADLs ?Mild memory issues--nothing worrisome ? ?He is due for bunion surgery later this month ?Not having significant pain now--but worried about the future ?Has walked in the mountains and others-5 miles or more without sig problems ?Cleared for surgery ? ?No chest pain ?No SOB ?No palpitations  ?No dizziness or syncope ?No edema ?No headaches ? ?Last GFR 59 ? ?Current Outpatient Medications on File Prior to Visit  ?Medication Sig Dispense Refill  ? amLODipine (NORVASC) 5 MG tablet TAKE 1 TABLET BY MOUTH EVERY DAY 90 tablet 3  ? aspirin 81 MG tablet Take 81 mg by mouth daily.    ? ketoconazole (NIZORAL) 2 % shampoo APPLY 1 APPLICATION TOPICALLY 2 (TWO) TIMES A WEEK. 120 mL 5  ? latanoprost (XALATAN) 0.005 % ophthalmic solution Place 1 drop into both eyes at bedtime.     ? olmesartan-hydrochlorothiazide (BENICAR HCT) 40-25 MG tablet TAKE 1 TABLET BY MOUTH EVERY DAY 90 tablet 3  ? rosuvastatin (CRESTOR) 20 MG tablet TAKE 1/2 TABLET BY MOUTH EVERY DAY 45 tablet 2  ? timolol (TIMOPTIC) 0.5 % ophthalmic solution 1 drop 2 (two) times daily.    ? triamcinolone cream (KENALOG) 0.1 % Apply 1 application topically as needed (skin irritation).  0  ? ?No current facility-administered medications on file prior to visit.  ? ? ?Allergies  ?Allergen Reactions  ? Atorvastatin Other (See Comments)  ?  MYALGIAS, LEG CRAMPING  ? Ezetimibe-Simvastatin Other  (See Comments)  ?  MYALGIAS, CRAMPING  ? Lisinopril Cough  ? Pravastatin Other (See Comments)  ?  Myalgias at '40mg'$  dosage  ? ? ?Past Medical History:  ?Diagnosis Date  ? Allergy   ? Aortic root dilatation (HCC)   ? Mild by echo 11/2011 (68m)  ? BPH (benign prostatic hypertrophy)   ? Bradycardia   ? Precluding BB use.  ? CAD (coronary artery disease)   ? a. s/p Taxus DES to RCA and PDA in 2006 .  // b. Myoview 8/11: Walked 12:37. EF 57%. no ischemia or scar.  // c. Mod CAD by cath 11/2011 (40-50% ISR of PDA -- for OP myoview to assess).  //  d. Myoview 3/18: EF 58, no ischemia; Low Risk  ? Colon polyps   ? Complication of anesthesia   ? "they said they about lost, me when I had eye surgery"  ? Diverticulitis   ? GERD (gastroesophageal reflux disease)   ? Glaucoma   ? Hyperlipidemia   ? Hypertension   ? Impaired fasting glucose   ? Sleep apnea   ? Syncope   ? ? ?Past Surgical History:  ?Procedure Laterality Date  ? ANGIOPLASTY    ? percutaneous transluminal coronary  angioplasty and stenting of the right coronary artery and posterior   ? CATARACT EXTRACTION W/ INTRAOCULAR LENS  IMPLANT, BILATERAL  9/15  ? COLONOSCOPY    ? INTRAOCULAR LENS INSERTION    ? LEFT HEART CATH AND CORONARY ANGIOGRAPHY N/A 10/14/2018  ? Procedure: LEFT HEART CATH AND CORONARY  ANGIOGRAPHY;  Surgeon: Martinique, Peter M, MD;  Location: [facility];  Service: Cardiovascular;  Laterality: N/A;  ? LEFT HEART CATHETERIZATION WITH CORONARY ANGIOGRAM N/A 11/26/2011  ? Procedure: LEFT HEART CATHETERIZATION WITH CORONARY ANGIOGRAM;  Surgeon: Thayer Headings, MD;  Location: [facility];  Service: Cardiovascular;  Laterality: N/A;  ? LUMBAR LAMINECTOMY/DECOMPRESSION MICRODISCECTOMY Left 05/27/2016  ? Procedure: Laminectomy for facet/synovial cyst - Lumbar four - Lumbar five - left;  Surgeon: Kary Kos, MD;  Location: [facility];  Service: Neurosurgery;  Laterality: Left;  Laminectomy for facet/synovial cyst - Lumbar four - Lumbar five - left  ? PILONIDAL CYST /  SINUS EXCISION    ? ? ?Family History  ?Problem Relation Age of Onset  ? Coronary artery disease Father   ? Heart disease Father   ? Heart failure Father   ? Cancer Mother   ?     ovarian cancer  ? Stroke Son   ? Hypertension Neg Hx   ? Diabetes Neg Hx   ? ? ?Social History  ? ?Socioeconomic History  ? Marital status: Divorced  ?  Spouse name: Not on file  ? Number of children: 3  ? Years of education: Not on file  ? Highest education level: Not on file  ?Occupational History  ? Occupation: driver part time  ?  Employer: retired  ?Tobacco Use  ? Smoking status: Former  ?  Years: 30.00  ?  Types: Cigarettes  ?  Quit date: 05/13/1978  ?  Years since quitting: 43.3  ?  Passive exposure: Past  ? Smokeless tobacco: Never  ?Vaping Use  ? Vaping Use: Never used  ?Substance and Sexual Activity  ? Alcohol use: Yes  ?  Alcohol/week: 14.0 standard drinks  ?  Types: 14 Glasses of wine per week  ? Drug use: No  ? Sexual activity: Yes  ?Other Topics Concern  ? Not on file  ?Social History Narrative  ? Has living will  ? Daughter Benjamine Mola should make medical decisions if he can't  ? Would accept resuscitation  ? Wouldn't want prolonged tube feeds if cognitively unaware  ? ?Social Determinants of Health  ? ?Financial Resource Strain: Not on file  ?Food Insecurity: Not on file  ?Transportation Needs: Not on file  ?Physical Activity: Not on file  ?Stress: Not on file  ?Social Connections: Not on file  ?Intimate Partner Violence: Not on file  ? ?Review of Systems ?Appetite is good ?Weight is stable ?Sleeps okay ?Wears seat belt ?Teeth are fine--keeps up with dentist ?Has mild allergy symptoms--hasn't used the zyrtec ?No suspicious skin lesions ?Rare heartburn--no meds. No dysphagia ?Bowels move fine--no blood ?Some hand joint swelling and mild low back pain ?   ?Objective:  ? Physical Exam ?Constitutional:   ?   Appearance: Normal appearance.  ?HENT:  ?   Mouth/Throat:  ?   Comments: No lesions ?Eyes:  ?   Conjunctiva/sclera:  Conjunctivae normal.  ?   Pupils: Pupils are equal, round, and reactive to light.  ?Cardiovascular:  ?   Rate and Rhythm: Regular rhythm. Bradycardia present.  ?   Pulses: Normal pulses.  ?   Heart sounds: No murmur heard. ?  No gallop.  ?Pulmonary:  ?   Effort: Pulmonary effort is normal.  ?   Breath sounds: Normal breath sounds. No wheezing or rales.  ?Abdominal:  ?   Palpations: Abdomen is soft.  ?   Tenderness: There is no abdominal tenderness.  ?Musculoskeletal:  ?  Cervical back: Neck supple.  ?   Right lower leg: No edema.  ?   Left lower leg: No edema.  ?   Comments: Modest bunions bilaterally  ?Lymphadenopathy:  ?   Cervical: No cervical adenopathy.  ?Skin: ?   Findings: No lesion or rash.  ?Neurological:  ?   General: No focal deficit present.  ?   Mental Status: He is alert and oriented to person, place, and time.  ?Psychiatric:     ?   Mood and Affect: Mood normal.     ?   Behavior: Behavior normal.  ?  ? ? ? ? ?   ?Assessment & Plan:  ? ?

## 2021-09-12 ENCOUNTER — Telehealth: Payer: Self-pay

## 2021-09-12 NOTE — Telephone Encounter (Signed)
Returned call to patient who verified that he has had no symptoms with his HR being low. States that yesterday he felt completely normal, but due to Dr Alla German suggestion, did schedule an appt with his opthalmologist this morning to rule out systemic absorption of Timolol eye drops. Pt states that he has had no SOB, fatigue, lethargy, confusion, weakness. This past weekend he went hiking in the mountains. States he took several 5 mile hikes, one of which had 111 steps down to a large waterfall and he had no issues at all. Pt advised to monitor his HR from now until upcoming appt with Dr [name] in July and will let us know if he has any concerns before then. ?

## 2021-09-12 NOTE — Telephone Encounter (Signed)
Patrick [name] called to cancel his surgery with Dr. Milinda Pointer on 10/19/2021. He stated it doesn't hurt that bad for him to have surgery. I just wants to wait. Notified Dr. Milinda Pointer and Caren Griffins with [facility] ?

## 2021-09-18 DIAGNOSIS — H401132 Primary open-angle glaucoma, bilateral, moderate stage: Secondary | ICD-10-CM | POA: Diagnosis not present

## 2021-10-02 ENCOUNTER — Other Ambulatory Visit: Payer: Self-pay | Admitting: Internal Medicine

## 2021-10-04 ENCOUNTER — Encounter: Payer: Medicare Other | Admitting: Podiatry

## 2021-10-11 ENCOUNTER — Encounter: Payer: Medicare Other | Admitting: Podiatry

## 2021-10-16 ENCOUNTER — Ambulatory Visit: Payer: Medicare Other | Admitting: Podiatry

## 2021-10-25 ENCOUNTER — Encounter: Payer: Medicare Other | Admitting: Podiatry

## 2021-11-08 ENCOUNTER — Encounter: Payer: Medicare Other | Admitting: Podiatry

## 2021-11-18 ENCOUNTER — Encounter: Payer: Self-pay | Admitting: Cardiovascular Disease

## 2021-11-18 NOTE — Progress Notes (Unsigned)
Patrick [name] Date of Birth  Nov 14, 1940       [name]. [address], [place]   [place], [place]  [address]    [phone]  Problem List: 1. Coronary artery disease-moderate.  He's had a previous stent placed. 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 A subsequent Myoview study was normal. 2. Hypertension 3. Hyperlipidemia 4. Sleep apnea  Patrick [name] is doing very will.   He has been on a mission trip and to [place] [place].  No further episodes of chest pain.   He hauled 5 loads of hogs last week - his brother raises the hogs.  He is planning on going to [place] over Christmas.  He still does his lawn care business.  He walks at the [employer] [place] - 3 miles a day.  Without symptoms.  November 09, 2012:  Patrick [name] is doing well.  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   He is a retired Administrator.   Still walking 3 days a week.    Oct. 9, 2015:  Still very active - mows 8-9 yards a week.  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  No angina. BP is high. Has had a cold - is taking chlorpheneramine  Has had normal Bp at home   Sep 19, 2014:   Still very active.  Drives a truck.  Mows and trims 10 yards a week without any problems.  Has atypical  chest pain with mental stress but never with physical exertion.  Does not restrict his diet.   Nov. 18, 2016  Still working hard ( driving a truck and mowing yards)  Gets more short of breath compared to several years ago  Still eats some salty foods.   Has a lady who is cooking for him  - knows that she puts lots of salt in the food  October 16, 2015:  Patrick [name] is seen back for his CAD, HTN, HLD . OSA  Just got back from a cruise in [place] .  Still very active.   Mows yards, still drives trucks.     Dec. 4, 2017:  Patrick [name] is seen today with [facility] Still drives his truck and mows regularly .   No real CP or dyspnea.   Very rare episodes of chest tightness.  Keeps his BP at  home.  Seems to go up when he comes to the doctor .   October 31, 2016:  Patrick [name] is seen today for follow up  Had back surgery in January .   Back pain has returned . Still very busy,   Mows frequently .   Has a [employer] that he loves but seems to contribute to his back pain.   No angnia  BP at home has been mildly elevated.   Admits that he eats more salt than he should .   Jan. 15, 2019:   BP is a little elevate.  No dyspnea. , no CP  Eating a bit of extra salt.  Has not been exercising .   January 26, 2018: Doing well BP at home has been good . Keeps a BP log. BP is a bit high today  - ate some hotdogs this weekend .  No CP . Exercising some .   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 and [place]  Did  lots of walking in [place]   May 17, 2019:  What is seen today for follow-up of his mild coronary artery disease, hypertension, hyperlipidemia. 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 Always a bit high when he comes in to the office    Jan. 3, 2022 Patrick [name] is seen today for follow up of his CAD , HTN, HLD No cp  Still working very hard.  Cleaned out 8 hen houses over the past week or so .  Walks 3 miles a day several times a week .  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 No CP  No dyspnea   Had palpitations  - would wake him up Last 15 minutes.  Was occurring frequenlty,  now has not had an episode for 10-12 days  We discussed a monitor but now he is not having any palpitation .  Will try propranolol  Toprol xl was called in but he is not taking it   Nov. 2, 2022 Woke up last week , room was spinning  Had a spontaneous nose bleed  Not constant  He denies any chest pain or shortness of breath.   He has been able to walk for many miles.  He rides his bike on a regular basis.  He is not had any cardiac issues.  HR has been slow  Symptoms sound more like vertigo   November 19, 2021 Patrick [name] is seen for follow up of his CAD, HTN,   Current Outpatient Medications on File Prior to Visit  Medication Sig Dispense Refill   amLODipine (NORVASC) 5 MG tablet TAKE 1 TABLET BY MOUTH EVERY DAY 90 tablet 3   aspirin 81 MG tablet Take 81 mg by mouth daily.     ketoconazole (NIZORAL) 2 % shampoo APPLY 1 APPLICATION TOPICALLY 2 (TWO) TIMES A WEEK. 120 mL 5   latanoprost (XALATAN) 0.005 % ophthalmic solution Place 1 drop into both eyes at bedtime.      olmesartan-hydrochlorothiazide (BENICAR HCT) 40-25 MG tablet TAKE 1 TABLET BY MOUTH EVERY DAY 90 tablet 3   rosuvastatin (CRESTOR) 20 MG tablet TAKE 1/2 TABLET BY MOUTH EVERY DAY 45 tablet 2   timolol (TIMOPTIC) 0.5 % ophthalmic solution 1 drop 2 (two) times daily.     triamcinolone cream (KENALOG) 0.1 % Apply 1 application topically as needed (skin irritation).  0   No current facility-administered medications on file prior to visit.    Allergies  Allergen Reactions   Atorvastatin Other (See Comments)    MYALGIAS, LEG CRAMPING   Ezetimibe-Simvastatin Other (See Comments)    MYALGIAS, CRAMPING   Lisinopril Cough   Pravastatin Other (See Comments)    Myalgias at '40mg'$  dosage    Past Medical History:  Diagnosis Date   Allergy    Aortic root dilatation (HCC)    Mild by echo 11/2011 (68m)   BPH (benign prostatic hypertrophy)    Bradycardia    Precluding BB use.   CAD (coronary artery disease)    a. s/p Taxus DES to RCA and PDA in 2006 .  // b. Myoview 8/11: Walked 12:37. EF 57%. no ischemia or scar.  // c. Mod CAD by cath 11/2011 (40-50% ISR of PDA -- for OP myoview  to assess).  //  d. Myoview 3/18: EF 58, no ischemia; Low Risk   Colon polyps    Complication of anesthesia    "they said they about lost, me when I had eye surgery"    Diverticulitis    GERD (gastroesophageal reflux disease)    Glaucoma    Hyperlipidemia    Hypertension    Impaired fasting glucose    Sleep apnea    Syncope     Past Surgical History:  Procedure Laterality Date   ANGIOPLASTY     percutaneous transluminal coronary  angioplasty and stenting of the right coronary artery and posterior    CATARACT EXTRACTION W/ INTRAOCULAR LENS  IMPLANT, BILATERAL  9/15   COLONOSCOPY     INTRAOCULAR LENS INSERTION     LEFT HEART CATH AND CORONARY ANGIOGRAPHY N/A 10/14/2018   Procedure: LEFT HEART CATH AND CORONARY ANGIOGRAPHY;  Surgeon: Martinique, Peter M, MD;  Location: [place] CV LAB;  Service: Cardiovascular;  Laterality: N/A;   LEFT HEART CATHETERIZATION WITH CORONARY ANGIOGRAM N/A 11/26/2011   Procedure: LEFT HEART CATHETERIZATION WITH CORONARY ANGIOGRAM;  Surgeon: Thayer Headings, MD;  Location: [facility] CATH LAB;  Service: Cardiovascular;  Laterality: N/A;   LUMBAR LAMINECTOMY/DECOMPRESSION MICRODISCECTOMY Left 05/27/2016   Procedure: Laminectomy for facet/synovial cyst - Lumbar four - Lumbar five - left;  Surgeon: Kary Kos, MD;  Location: [place];  Service: Neurosurgery;  Laterality: Left;  Laminectomy for facet/synovial cyst - Lumbar four - Lumbar five - left   PILONIDAL CYST / SINUS EXCISION      Social History   Tobacco Use  Smoking Status Former   Years: 30.00   Types: Cigarettes   Quit date: 05/13/1978   Years since quitting: 43.5   Passive exposure: Past  Smokeless Tobacco Never    Social History   Substance and Sexual Activity  Alcohol Use Yes   Alcohol/week: 14.0 standard drinks of alcohol   Types: 14 Glasses of wine per week    Family History  Problem Relation Age of Onset   Coronary artery disease Father    Heart disease Father    Heart failure Father    Cancer Mother        ovarian cancer   Stroke Son    Hypertension Neg Hx    Diabetes Neg Hx     Reviw of Systems:  Noted in current history, otherwise systems are  negative.  Physical Exam: There were no vitals taken for this visit.  GEN:  Well nourished, well developed in no acute distress HEENT: Normal NECK: No JVD; No carotid bruits LYMPHATICS: No lymphadenopathy CARDIAC: RRR ***, no murmurs, rubs, gallops RESPIRATORY:  Clear to auscultation without rales, wheezing or rhonchi  ABDOMEN: Soft, non-tender, non-distended MUSCULOSKELETAL:  No edema; No deformity  SKIN: Warm and dry NEUROLOGIC:  Alert and oriented x 3     ECG:       Assessment / Plan:   1. Coronary artery disease-moderate.   No angina .    2. Hypertension-         3.  Hyperlipidemia:    4. Vertigo :          Mertie Moores, MD  11/18/2021 7:07 AM    [address],  [place], [place]  [address] Pager [phone] Phone: [phone]; Fax: [phone]

## 2021-11-19 ENCOUNTER — Ambulatory Visit: Payer: Medicare Other | Admitting: Cardiovascular Disease

## 2021-11-19 ENCOUNTER — Encounter: Payer: Self-pay | Admitting: Cardiovascular Disease

## 2021-11-19 VITALS — BP 138/70 | HR 57 | Ht 68.0 in | Wt 179.2 lb

## 2021-11-19 DIAGNOSIS — E782 Mixed hyperlipidemia: Secondary | ICD-10-CM

## 2021-11-19 DIAGNOSIS — I10 Essential (primary) hypertension: Secondary | ICD-10-CM | POA: Diagnosis not present

## 2021-11-19 NOTE — Patient Instructions (Signed)
Medication Instructions:  Your physician recommends that you continue on your current medications as directed. Please refer to the Current Medication list given to you today.  *If you need a refill on your cardiac medications before your next appointment, please call your pharmacy*   Lab Work: NONE If you have labs (blood work) drawn today and your tests are completely normal, you will receive your results only by: [place] (if you have MyChart) OR A paper copy in the mail If you have any lab test that is abnormal or we need to change your treatment, we will call you to review the results.   Testing/Procedures: NONE   Follow-Up: At [facility], you and your health needs are our priority.  As part of our continuing mission to provide you with exceptional heart care, we have created designated Provider Care Teams.  These Care Teams include your primary Cardiologist (physician) and Advanced Practice Providers (APPs -  Physician Assistants and Nurse Practitioners) who all work together to provide you with the care you need, when you need it.  We recommend signing up for the patient portal called "MyChart".  Sign up information is provided on this After Visit Summary.  MyChart is used to connect with patients for Virtual Visits (Telemedicine).  Patients are able to view lab/test results, encounter notes, upcoming appointments, etc.  Non-urgent messages can be sent to your provider as well.   To learn more about what you can do with MyChart, go to [email or web address].    Your next appointment:   1 year(s)  The format for your next appointment:   In Person  Provider:   Nahser, Ann Maki, or [employer] {     Important Information About Sugar

## 2021-11-27 ENCOUNTER — Encounter: Payer: Medicare Other | Admitting: Podiatry

## 2021-12-07 ENCOUNTER — Ambulatory Visit
Admission: EM | Admit: 2021-12-07 | Discharge: 2021-12-07 | Disposition: A | Discharge status: Home / Self Care | Payer: Medicare Other

## 2021-12-07 ENCOUNTER — Encounter: Payer: Self-pay | Admitting: Emergency Medicine

## 2021-12-07 DIAGNOSIS — T63463A Toxic effect of venom of wasps, assault, initial encounter: Secondary | ICD-10-CM | POA: Diagnosis not present

## 2021-12-07 DIAGNOSIS — R21 Rash and other nonspecific skin eruption: Secondary | ICD-10-CM

## 2021-12-07 NOTE — Discharge Instructions (Addendum)
Take Zyrtec as directed.    Call 911 and go to the emergency department if you have difficulty swallowing or breathing.    Follow up with your primary care provider if your symptoms are not improving.
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
## 2021-12-07 NOTE — ED Notes (Signed)
Patient in office today with neighbor. 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  Denies: n/v, fever OTC: none

## 2021-12-13 ENCOUNTER — Encounter: Payer: Self-pay | Admitting: Internal Medicine

## 2021-12-13 DIAGNOSIS — L814 Other melanin hyperpigmentation: Secondary | ICD-10-CM | POA: Diagnosis not present

## 2021-12-13 DIAGNOSIS — D225 Melanocytic nevi of trunk: Secondary | ICD-10-CM | POA: Diagnosis not present

## 2021-12-13 DIAGNOSIS — D1801 Hemangioma of skin and subcutaneous tissue: Secondary | ICD-10-CM | POA: Diagnosis not present

## 2021-12-13 DIAGNOSIS — D485 Neoplasm of uncertain behavior of skin: Secondary | ICD-10-CM | POA: Diagnosis not present

## 2021-12-13 DIAGNOSIS — D3617 Benign neoplasm of peripheral nerves and autonomic nervous system of trunk, unspecified: Secondary | ICD-10-CM | POA: Diagnosis not present

## 2021-12-22 ENCOUNTER — Other Ambulatory Visit: Payer: Self-pay

## 2021-12-22 ENCOUNTER — Encounter (HOSPITAL_COMMUNITY): Payer: Self-pay | Admitting: Emergency Medicine

## 2021-12-22 ENCOUNTER — Emergency Department (HOSPITAL_COMMUNITY)
Admission: EM | Admit: 2021-12-22 | Discharge: 2021-12-22 | Disposition: A | Discharge status: Home / Self Care | Payer: Medicare Other | Attending: Emergency Medicine | Admitting: Emergency Medicine

## 2021-12-22 DIAGNOSIS — R197 Diarrhea, unspecified: Secondary | ICD-10-CM | POA: Insufficient documentation

## 2021-12-22 DIAGNOSIS — R1030 Lower abdominal pain, unspecified: Secondary | ICD-10-CM | POA: Insufficient documentation

## 2021-12-22 DIAGNOSIS — R112 Nausea with vomiting, unspecified: Secondary | ICD-10-CM | POA: Diagnosis not present

## 2021-12-22 DIAGNOSIS — Z5321 Procedure and treatment not carried out due to patient leaving prior to being seen by health care provider: Secondary | ICD-10-CM | POA: Insufficient documentation

## 2021-12-22 LAB — CBC
HCT: 39.9 % (ref 39.0–52.0)
Hemoglobin: 13.7 g/dL (ref 13.0–17.0)
MCH: 31.6 pg (ref 26.0–34.0)
MCHC: 34.3 g/dL (ref 30.0–36.0)
MCV: 92.1 fL (ref 80.0–100.0)
Platelets: 276 10*3/uL (ref 150–400)
RBC: 4.33 MIL/uL (ref 4.22–5.81)
RDW: 12.9 % (ref 11.5–15.5)
WBC: 7.2 10*3/uL (ref 4.0–10.5)
nRBC: 0 % (ref 0.0–0.2)

## 2021-12-22 LAB — URINALYSIS, ROUTINE W REFLEX MICROSCOPIC
Bacteria, UA: NONE SEEN
Bilirubin Urine: NEGATIVE
Glucose, UA: NEGATIVE mg/dL
Hgb urine dipstick: NEGATIVE
Ketones, ur: NEGATIVE mg/dL
Nitrite: NEGATIVE
Protein, ur: NEGATIVE mg/dL
Specific Gravity, Urine: 1.017 (ref 1.005–1.030)
pH: 5 (ref 5.0–8.0)

## 2021-12-22 LAB — COMPREHENSIVE METABOLIC PANEL
ALT: 29 U/L (ref 0–44)
AST: 27 U/L (ref 15–41)
Albumin: 4.1 g/dL (ref 3.5–5.0)
Alkaline Phosphatase: 68 U/L (ref 38–126)
Anion gap: 9 (ref 5–15)
BUN: 16 mg/dL (ref 8–23)
CO2: 23 mmol/L (ref 22–32)
Calcium: 9.3 mg/dL (ref 8.9–10.3)
Chloride: 106 mmol/L (ref 98–111)
Creatinine, Ser: 1.29 mg/dL — ABNORMAL HIGH (ref 0.61–1.24)
GFR, Estimated: 56 mL/min — ABNORMAL LOW (ref >60)
Glucose, Bld: 136 mg/dL — ABNORMAL HIGH (ref 70–99)
Potassium: 4.2 mmol/L (ref 3.5–5.1)
Sodium: 138 mmol/L (ref 135–145)
Total Bilirubin: 0.7 mg/dL (ref 0.3–1.2)
Total Protein: 7.1 g/dL (ref 6.5–8.1)

## 2021-12-22 LAB — LIPASE, BLOOD: Lipase: 37 U/L (ref 11–51)

## 2021-12-22 NOTE — ED Provider Triage Note (Signed)
Emergency Medicine Provider Triage Evaluation Note  Patrick [name] , a 81 y.o. male  was evaluated in triage.  Pt complains of diarrhea.  Patient states that he has had diarrhea for 3 weeks.  He states that he is going about 6 times a day.  Nonbloody diarrhea.  He states he has also had nausea without vomiting.  Complains of lower abdominal pain.  Denies any urinary symptoms, penile discharge, fevers, previous abdominal surgeries.  He states that his friend that he was with 3 weeks ago in [place] also similar symptoms.  He denies any recent antibiotics.  Review of Systems  Positive:  Negative:   Physical Exam  BP (!) 170/70 (BP Location: Right Arm)   Pulse (!) 54   Temp 98.4 F (36.9 C) (Oral)   Resp 16   SpO2 95%  Gen:   Awake, no distress   Resp:  Normal effort  MSK:   Moves extremities without difficulty  Other:  Abdomen is rounded, soft, nontender to palpation.  Bowel sounds present.  Medical Decision Making  Medically screening exam initiated at 10:00 AM.  Appropriate orders placed.  Todd N Stonehocker was informed that the remainder of the evaluation will be completed by another provider, this initial triage assessment does not replace that evaluation, and the importance of remaining in the ED until their evaluation is complete.     Mickie Hillier, PA-C 12/22/21 1003

## 2021-12-22 NOTE — ED Notes (Signed)
Pt stated "was fed up with waiting and that pt is only here for diarrhea and if that is the case pt can sit on the toilet at home". Pt witness leaving after handing this NT labels.

## 2021-12-22 NOTE — ED Triage Notes (Signed)
Patient here w/ diarrhea and lower bilateral  abdominal pain for the last 3 weeks. This pain is intermittent. Feels nauseated but no emesis. Pt states he was just in [place] 3 weeks ago as well and states his friend is also having similar symptoms as well.

## 2022-01-11 DIAGNOSIS — R197 Diarrhea, unspecified: Secondary | ICD-10-CM | POA: Diagnosis not present

## 2022-02-03 ENCOUNTER — Ambulatory Visit
Admission: EM | Admit: 2022-02-03 | Discharge: 2022-02-03 | Disposition: A | Discharge status: Home / Self Care | Payer: Medicare Other | Attending: Emergency Medicine | Admitting: Emergency Medicine

## 2022-02-03 DIAGNOSIS — L237 Allergic contact dermatitis due to plants, except food: Secondary | ICD-10-CM | POA: Diagnosis not present

## 2022-02-03 MED ORDER — PREDNISONE 10 MG (21) PO TBPK: ORAL_TABLET | Freq: Every day | ORAL | 0 refills | Status: DC

## 2022-02-03 MED ORDER — DEXAMETHASONE SODIUM PHOSPHATE 10 MG/ML IJ SOLN
10.0000 mg | Freq: Once | INTRAMUSCULAR | Status: AC
  Administered 2022-02-03: 10 mg via INTRAMUSCULAR

## 2022-02-03 NOTE — ED Triage Notes (Signed)
Patient to Urgent Care with complaints of itchy rash present to bilateral arms. Started on Friday.   Reports possible poison ivy, cut it down from a wall.

## 2022-02-03 NOTE — ED Provider Notes (Signed)
Roderic Palau    CSN: [id number] Arrival date & time: 02/03/22  1143      History   Chief Complaint Chief Complaint  Patient presents with   Poison Ivy    HPI Patrick [name] is a 81 y.o. male.  Patient presents with 2-day history of pruritic rash on his arms after coming in contact with poison oak.  No lesions in his mouth or eyes.  No difficulty swallowing or breathing.  No oral OTC medications taken.  His medical history includes hypertension, CAD, hyperlipidemia, CKD 3, GERD, diverticulitis, BPH, glaucoma.  The history is provided by the patient and medical records.    Past Medical History:  Diagnosis Date   Allergy    Aortic root dilatation (HCC)    Mild by echo 11/2011 (54m)   BPH (benign prostatic hypertrophy)    Bradycardia    Precluding BB use.   CAD (coronary artery disease)    a. s/p Taxus DES to RCA and PDA in 2006 .  // b. Myoview 8/11: Walked 12:37. EF 57%. no ischemia or scar.  // c. Mod CAD by cath 11/2011 (40-50% ISR of PDA -- for OP myoview to assess).  //  d. Myoview 3/18: EF 58, no ischemia; Low Risk   Colon polyps    Complication of anesthesia    "they said they about lost, me when I had eye surgery"   Diverticulitis    GERD (gastroesophageal reflux disease)    Glaucoma    Hyperlipidemia    Hypertension    Impaired fasting glucose    Sleep apnea    Syncope     Patient Active Problem List   Diagnosis Date Noted   Bradycardia 09/11/2021   Bilateral bunions 09/11/2021   CAD (coronary artery disease) 10/15/2018   Chronic kidney disease, stage 3a [place] 07/01/2018   Coronary artery disease involving native coronary artery of native heart without angina pectoris 04/15/2016   Advance directive discussed with patient 08/08/2014   [place]ilated [place] root ([place] 12/11/2011   Routine general medical examination at a health care facility 12/28/2010   Hyperlipidemia 01/09/2007   Essential hypertension 01/09/2007   ALLERGIC RHINITIS 01/09/2007   GERD  01/09/2007   DIVERTICULOSIS, COLON 01/09/2007   Benign enlargement of prostate 01/09/2007   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   COLONIC POLYPS, HX OF 01/09/2007    Past Surgical History:  Procedure Laterality Date   ANGIOPLASTY     percutaneous transluminal coronary  angioplasty and stenting of the right coronary artery and posterior    CATARACT EXTRACTION W/ INTRAOCULAR LENS  IMPLANT, BILATERAL  9/15   COLONOSCOPY     INTRAOCULAR LENS INSERTION     LEFT HEART CATH AND CORONARY ANGIOGRAPHY N/A 10/14/2018   Procedure: LEFT HEART CATH AND CORONARY ANGIOGRAPHY;  Surgeon: JMartinique Peter M, MD;  [facility];  Service: Cardiovascular;  Laterality: N/A;   LEFT HEART CATHETERIZATION WITH CORONARY ANGIOGRAM N/A 11/26/2011   Procedure: LEFT HEART CATHETERIZATION WITH CORONARY ANGIOGRAM;  Surgeon: PThayer Headings MD;  [facility];  Service: Cardiovascular;  Laterality: N/A;   LUMBAR LAMINECTOMY/DECOMPRESSION MICRODISCECTOMY Left 05/27/2016   Procedure: Laminectomy for facet/synovial cyst - Lumbar four - Lumbar five - left;  Surgeon: GKary Kos MD;  [facility]  Service: Neurosurgery;  Laterality: Left;  Laminectomy for facet/synovial cyst - Lumbar four - Lumbar five - left   PILONIDAL CYST / SINUS EXCISION         Home Medications  Prior to Admission medications   Medication Sig Start Date End Date Taking? Authorizing Provider  predniSONE (STERAPRED UNI-PAK 21 TAB) 10 MG (21) TBPK tablet Take by mouth daily. As directed 02/04/22  Yes Sharion Balloon, NP  amLODipine (NORVASC) 5 MG tablet TAKE 1 TABLET BY MOUTH EVERY DAY 10/02/21   Viviana Simpler I, MD  aspirin 81 MG tablet Take 81 mg by mouth daily.    [provider]  ketoconazole (NIZORAL) 2 % shampoo APPLY 1 APPLICATION TOPICALLY 2 (TWO) TIMES A WEEK. 08/02/21   Viviana Simpler I, MD  latanoprost (XALATAN) 0.005 % ophthalmic solution Place 1 drop into both eyes at bedtime.  01/24/14   [provider]   olmesartan-hydrochlorothiazide (BENICAR HCT) 40-25 MG tablet TAKE 1 TABLET BY MOUTH EVERY DAY 03/27/21   Nahser, Wonda Cheng, MD  rosuvastatin (CRESTOR) 20 MG tablet TAKE 1/2 TABLET BY MOUTH EVERY DAY 07/04/21   Nahser, Wonda Cheng, MD  timolol (TIMOPTIC) 0.5 % ophthalmic solution 1 drop 2 (two) times daily. 02/15/21   [provider]  triamcinolone cream (KENALOG) 0.1 % Apply 1 application topically as needed (skin irritation). 11/06/17   [provider]    Family History Family History  Problem Relation Age of Onset   Coronary artery disease Father    Heart disease Father    Heart failure Father    Cancer Mother        ovarian cancer   Stroke Son    Hypertension Neg Hx    Diabetes Neg Hx     Social History Social History   Tobacco Use   Smoking status: Former    Years: 30.00    Types: Cigarettes    Quit date: 05/13/1978    Years since quitting: 43.7    Passive exposure: Past   Smokeless tobacco: Never  Vaping Use   Vaping Use: Never used  Substance Use Topics   Alcohol use: Yes    Alcohol/week: 14.0 standard drinks of alcohol    Types: 14 Glasses of wine per week   Drug use: No     Allergies   Atorvastatin, Ezetimibe-simvastatin, Lisinopril, and Pravastatin   Review of Systems Review of Systems  Constitutional:  Negative for chills and fever.  HENT:  Negative for trouble swallowing and voice change.   Respiratory:  Negative for cough and shortness of breath.   Musculoskeletal:  Negative for arthralgias and joint swelling.  Skin:  Positive for rash. Negative for color change.  All other systems reviewed and are negative.    Physical Exam Triage Vital Signs ED Triage Vitals  Enc Vitals Group     BP 02/03/22 1239 (!) 150/80     Pulse Rate 02/03/22 1239 (!) 54     Resp 02/03/22 1239 18     Temp 02/03/22 1239 98.2 F (36.8 C)     Temp src --      SpO2 02/03/22 1239 97 %     Weight 02/03/22 1242 178 lb (80.7 kg)     Height 02/03/22 1242 '5\' 7"'$   (1.702 m)     Head Circumference --      Peak Flow --      Pain Score 02/03/22 1240 0     Pain Loc --      Pain Edu? --      Excl. in [place]? --    No data found.  Updated Vital Signs BP (!) 150/80   Pulse (!) 54   Temp 98.2 F (36.8 C)  Resp 18   Ht '5\' 7"'$  (1.702 m)   Wt 178 lb (80.7 kg)   SpO2 97%   BMI 27.88 kg/m   Visual Acuity Right Eye Distance:   Left Eye Distance:   Bilateral Distance:    Right Eye Near:   Left Eye Near:    Bilateral Near:     Physical Exam Vitals and nursing note reviewed.  Constitutional:      General: He is not in acute distress.    Appearance: Normal appearance. He is well-developed. He is not ill-appearing.  HENT:     Mouth/Throat:     Mouth: Mucous membranes are moist.     Pharynx: Oropharynx is clear.  Eyes:     Conjunctiva/sclera: Conjunctivae normal.  Cardiovascular:     Rate and Rhythm: Normal rate and regular rhythm.     Heart sounds: Normal heart sounds.  Pulmonary:     Effort: Pulmonary effort is normal. No respiratory distress.     Breath sounds: Normal breath sounds.  Musculoskeletal:     Cervical back: Neck supple.  Skin:    General: Skin is warm and dry.     Findings: Rash present.     Comments: Clustered papular and vesicular rash on bilateral forearms.  Neurological:     Mental Status: He is alert.  Psychiatric:        Mood and Affect: Mood normal.        Behavior: Behavior normal.      UC Treatments / Results  Labs (all labs ordered are listed, but only abnormal results are displayed) Labs Reviewed - No data to display  EKG   Radiology No results found.  Procedures Procedures (including critical care time)  Medications Ordered in UC Medications  dexamethasone (DECADRON) injection 10 mg (10 mg Intramuscular Given 02/03/22 1312)    Initial Impression / Assessment and Plan / UC Course  I have reviewed the triage vital signs and the nursing notes.  Pertinent labs & imaging results that were  available during my care of the patient were reviewed by me and considered in my medical decision making (see chart for details).    Poison oak dermatitis.  Injection of dexamethasone given; starting prednisone taper tomorrow.  Instructed patient to take Zyrtec and use calamine lotion also.  Instructed patient to follow up with his PCP if his symptoms are not improving.  He agrees to plan of care.    Final Clinical Impressions(s) / UC Diagnoses   Final diagnoses:  Poison oak dermatitis     Discharge Instructions      You were given an injection of a steroid called dexamethasone.  Start the prednisone taper tomorrow as directed.    Take Zyrtec as directed.    Follow up with your primary care provider.        ED Prescriptions     Medication Sig Dispense Auth. Provider   predniSONE (STERAPRED UNI-PAK 21 TAB) 10 MG (21) TBPK tablet Take by mouth daily. As directed 21 tablet Sharion Balloon, NP      PDMP not reviewed this encounter.   Sharion Balloon, NP 02/03/22 1313

## 2022-02-03 NOTE — Discharge Instructions (Addendum)
You were given an injection of a steroid called dexamethasone.  Start the prednisone taper tomorrow as directed.    Take Zyrtec as directed.    Follow up with your primary care provider.    

## 2022-02-20 DIAGNOSIS — R197 Diarrhea, unspecified: Secondary | ICD-10-CM | POA: Diagnosis not present

## 2022-02-20 DIAGNOSIS — D120 Benign neoplasm of cecum: Secondary | ICD-10-CM | POA: Diagnosis not present

## 2022-02-20 DIAGNOSIS — D123 Benign neoplasm of transverse colon: Secondary | ICD-10-CM | POA: Diagnosis not present

## 2022-02-20 DIAGNOSIS — K649 Unspecified hemorrhoids: Secondary | ICD-10-CM | POA: Diagnosis not present

## 2022-02-20 DIAGNOSIS — Z86010 Personal history of colonic polyps: Secondary | ICD-10-CM | POA: Diagnosis not present

## 2022-02-20 DIAGNOSIS — K5730 Diverticulosis of large intestine without perforation or abscess without bleeding: Secondary | ICD-10-CM | POA: Diagnosis not present

## 2022-02-20 DIAGNOSIS — D122 Benign neoplasm of ascending colon: Secondary | ICD-10-CM | POA: Diagnosis not present

## 2022-02-22 DIAGNOSIS — D123 Benign neoplasm of transverse colon: Secondary | ICD-10-CM | POA: Diagnosis not present

## 2022-02-22 DIAGNOSIS — D120 Benign neoplasm of cecum: Secondary | ICD-10-CM | POA: Diagnosis not present

## 2022-02-22 DIAGNOSIS — D122 Benign neoplasm of ascending colon: Secondary | ICD-10-CM | POA: Diagnosis not present

## 2022-03-30 ENCOUNTER — Other Ambulatory Visit: Payer: Self-pay | Admitting: Cardiovascular Disease

## 2022-04-10 DIAGNOSIS — H401132 Primary open-angle glaucoma, bilateral, moderate stage: Secondary | ICD-10-CM | POA: Diagnosis not present

## 2022-08-14 DIAGNOSIS — H353112 Nonexudative age-related macular degeneration, right eye, intermediate dry stage: Secondary | ICD-10-CM | POA: Diagnosis not present

## 2022-08-14 DIAGNOSIS — H401132 Primary open-angle glaucoma, bilateral, moderate stage: Secondary | ICD-10-CM | POA: Diagnosis not present

## 2022-08-14 DIAGNOSIS — H0100B Unspecified blepharitis left eye, upper and lower eyelids: Secondary | ICD-10-CM | POA: Diagnosis not present

## 2022-08-14 DIAGNOSIS — H0100A Unspecified blepharitis right eye, upper and lower eyelids: Secondary | ICD-10-CM | POA: Diagnosis not present

## 2022-08-14 DIAGNOSIS — H35371 Puckering of macula, right eye: Secondary | ICD-10-CM | POA: Diagnosis not present

## 2022-09-07 ENCOUNTER — Other Ambulatory Visit: Payer: Self-pay | Admitting: Internal Medicine

## 2022-09-17 ENCOUNTER — Encounter: Payer: Medicare Other | Admitting: Internal Medicine

## 2022-09-25 ENCOUNTER — Encounter: Payer: Self-pay | Admitting: Internal Medicine

## 2022-09-25 ENCOUNTER — Ambulatory Visit (INDEPENDENT_AMBULATORY_CARE_PROVIDER_SITE_OTHER): Payer: Medicare Other | Admitting: Internal Medicine

## 2022-09-25 VITALS — BP 130/78 | HR 47 | Temp 97.8°F | Ht 67.5 in | Wt 181.1 lb

## 2022-09-25 DIAGNOSIS — I2510 Atherosclerotic heart disease of native coronary artery without angina pectoris: Secondary | ICD-10-CM | POA: Diagnosis not present

## 2022-09-25 DIAGNOSIS — I77810 Thoracic aortic ectasia: Secondary | ICD-10-CM | POA: Diagnosis not present

## 2022-09-25 DIAGNOSIS — K219 Gastro-esophageal reflux disease without esophagitis: Secondary | ICD-10-CM

## 2022-09-25 DIAGNOSIS — Z0000 Encounter for general adult medical examination without abnormal findings: Secondary | ICD-10-CM | POA: Diagnosis not present

## 2022-09-25 DIAGNOSIS — I10 Essential (primary) hypertension: Secondary | ICD-10-CM | POA: Diagnosis not present

## 2022-09-25 DIAGNOSIS — N1831 Chronic kidney disease, stage 3a: Secondary | ICD-10-CM

## 2022-09-25 LAB — CBC
HCT: 41.7 % (ref 39.0–52.0)
Hemoglobin: 14 g/dL (ref 13.0–17.0)
MCHC: 33.7 g/dL (ref 30.0–36.0)
MCV: 93.1 fl (ref 78.0–100.0)
Platelets: 284 10*3/uL (ref 150.0–400.0)
RBC: 4.47 Mil/uL (ref 4.22–5.81)
RDW: 14 % (ref 11.5–15.5)
WBC: 5.5 10*3/uL (ref 4.0–10.5)

## 2022-09-25 LAB — HEPATIC FUNCTION PANEL
ALT: 12 U/L (ref 0–53)
AST: 16 U/L (ref 0–37)
Albumin: 4.3 g/dL (ref 3.5–5.2)
Alkaline Phosphatase: 81 U/L (ref 39–117)
Bilirubin, Direct: 0.1 mg/dL (ref 0.0–0.3)
Total Bilirubin: 0.5 mg/dL (ref 0.2–1.2)
Total Protein: 7.2 g/dL (ref 6.0–8.3)

## 2022-09-25 LAB — LIPID PANEL
Cholesterol: 146 mg/dL (ref 0–200)
HDL: 46.9 mg/dL (ref >39.00)
LDL Cholesterol: 78 mg/dL (ref 0–99)
NonHDL: 99.09
Total CHOL/HDL Ratio: 3
Triglycerides: 107 mg/dL (ref 0.0–149.0)
VLDL: 21.4 mg/dL (ref 0.0–40.0)

## 2022-09-25 LAB — RENAL FUNCTION PANEL
Albumin: 4.3 g/dL (ref 3.5–5.2)
BUN: 24 mg/dL — ABNORMAL HIGH (ref 6–23)
CO2: 30 mEq/L (ref 19–32)
Calcium: 9.5 mg/dL (ref 8.4–10.5)
Chloride: 102 mEq/L (ref 96–112)
Creatinine, Ser: 1.34 mg/dL (ref 0.40–1.50)
GFR: 49.56 mL/min — ABNORMAL LOW (ref >60.00)
Glucose, Bld: 102 mg/dL — ABNORMAL HIGH (ref 70–99)
Phosphorus: 3 mg/dL (ref 2.3–4.6)
Potassium: 4.8 mEq/L (ref 3.5–5.1)
Sodium: 137 mEq/L (ref 135–145)

## 2022-09-25 NOTE — Assessment & Plan Note (Signed)
Some ectasia On statin

## 2022-09-25 NOTE — Progress Notes (Signed)
Subjective:    Patient ID: Patrick [name], male    DOB: Mar 31, 1941, 82 y.o.   MRN: [id number]  HPI Here for Medicare wellness visit and follow up of chronic health conditions Reviewed advanced directives Reviewed other doctors---Dr Tanner--ophthal, Dr Magod--GI, Dr Judithe Modest, Dr Nahser--cardiology, Dr Hyatt--podiatry, Dr Haskel Schroeder No hospitalizations or surgery in the past year Walks regularly and stays active Still enjoys wine with dinner No tobacco products Mild vision problems-- 20/40 (might need glasses again) Some hearing issues---not bad enough for aides No falls No depression or anhedonia Independent with instrumental ADLs No worrisome memory issues  Just finished mission trip in [place] Did a lot of work on homes--decks, etc  No chest pain No SOB No dizziness or syncope Stopped aspirin---concerned about side effects No palpitations  Last GFR up to 56  Allergy symptoms with the grass Uses zyrtec when that happens  Current Outpatient Medications on File Prior to Visit  Medication Sig Dispense Refill   amLODipine (NORVASC) 5 MG tablet TAKE 1 TABLET BY MOUTH EVERY DAY 90 tablet 0   cetirizine (ZYRTEC) 10 MG tablet Take 10 mg by mouth daily as needed for allergies.     ketoconazole (NIZORAL) 2 % shampoo APPLY 1 APPLICATION TOPICALLY 2 (TWO) TIMES A WEEK. 120 mL 5   latanoprost (XALATAN) 0.005 % ophthalmic solution Place 1 drop into both eyes at bedtime.      olmesartan-hydrochlorothiazide (BENICAR HCT) 40-25 MG tablet TAKE 1 TABLET BY MOUTH EVERY DAY 90 tablet 2   rosuvastatin (CRESTOR) 20 MG tablet TAKE 1/2 TABLET BY MOUTH EVERY DAY 45 tablet 2   timolol (TIMOPTIC) 0.5 % ophthalmic solution 1 drop 2 (two) times daily.     triamcinolone cream (KENALOG) 0.1 % Apply 1 application topically as needed (skin irritation).  0   No current facility-administered medications on file prior to visit.    Allergies  Allergen Reactions   Atorvastatin Other  (See Comments)    MYALGIAS, LEG CRAMPING   Ezetimibe-Simvastatin Other (See Comments)    MYALGIAS, CRAMPING   Lisinopril Cough   Pravastatin Other (See Comments)    Myalgias at 40mg  dosage    Past Medical History:  Diagnosis Date   Allergy    Aortic root dilatation (HCC)    Mild by echo 11/2011 (39mm)   BPH (benign prostatic hypertrophy)    Bradycardia    Precluding BB use.   CAD (coronary artery disease)    a. s/p Taxus DES to RCA and PDA in 2006 .  // b. Myoview 8/11: Walked 12:37. EF 57%. no ischemia or scar.  // c. Mod CAD by cath 11/2011 (40-50% ISR of PDA -- for OP myoview to assess).  //  d. Myoview 3/18: EF 58, no ischemia; Low Risk   Colon polyps    Complication of anesthesia    "they said they about lost, me when I had eye surgery"   Diverticulitis    GERD (gastroesophageal reflux disease)    Glaucoma    Hyperlipidemia    Hypertension    Impaired fasting glucose    Sleep apnea    Syncope     Past Surgical History:  Procedure Laterality Date   ANGIOPLASTY     percutaneous transluminal coronary  angioplasty and stenting of the right coronary artery and posterior    CATARACT EXTRACTION W/ INTRAOCULAR LENS  IMPLANT, BILATERAL  9/15   COLONOSCOPY     INTRAOCULAR LENS INSERTION     LEFT HEART CATH AND CORONARY ANGIOGRAPHY  N/A 10/14/2018   Procedure: LEFT HEART CATH AND CORONARY ANGIOGRAPHY;  Surgeon: [place], Peter M, MD;  Location: [facility];  Service: Cardiovascular;  Laterality: N/A;   LEFT HEART CATHETERIZATION WITH CORONARY ANGIOGRAM N/A 11/26/2011   Procedure: LEFT HEART CATHETERIZATION WITH CORONARY ANGIOGRAM;  Surgeon: Vesta Mixer, MD;  Location: [facility];  Service: Cardiovascular;  Laterality: N/A;   LUMBAR LAMINECTOMY/DECOMPRESSION MICRODISCECTOMY Left 05/27/2016   Procedure: Laminectomy for facet/synovial cyst - Lumbar four - Lumbar five - left;  Surgeon: Donalee Citrin, MD;  Location: [facility];  Service: Neurosurgery;  Laterality: Left;  Laminectomy for  facet/synovial cyst - Lumbar four - Lumbar five - left   PILONIDAL CYST / SINUS EXCISION      Family History  Problem Relation Age of Onset   Coronary artery disease Father    Heart disease Father    Heart failure Father    Cancer Mother        ovarian cancer   Stroke Son    Hypertension Neg Hx    Diabetes Neg Hx     Social History   Socioeconomic History   Marital status: Divorced    Spouse name: Not on file   Number of children: 3   Years of education: Not on file   Highest education level: Not on file  Occupational History   Occupation: driver part time    Employer: retired  Tobacco Use   Smoking status: Former    Years: 30    Types: Cigarettes    Quit date: 05/13/1978    Years since quitting: 44.4    Passive exposure: Past   Smokeless tobacco: Never  Vaping Use   Vaping Use: Never used  Substance and Sexual Activity   Alcohol use: Yes    Alcohol/week: 14.0 standard drinks of alcohol    Types: 14 Glasses of wine per week   Drug use: No   Sexual activity: Yes  Other Topics Concern   Not on file  Social History Narrative   Has living will   Daughter [name] should make medical decisions if he can't   Would accept resuscitation   Wouldn't want prolonged tube feeds if cognitively unaware   Social Determinants of Health   Financial Resource Strain: Not on file  Food Insecurity: Not on file  Transportation Needs: Not on file  Physical Activity: Not on file  Stress: Not on file  Social Connections: Not on file  Intimate Partner Violence: Not on file   Review of Systems Appetite is good Weight stable Sleeps fine Wears seat belt Teeth okay--keeps up with dentist. Some gum recession No suspicious skin lesions---just easy bruising Occasional heartburn--brief. Usually doesn't use meds. No dysphagia Bowels move fine--no blood Some low back pain at the end of the day---if long work day or walks over 3 miles. No meds     Objective:   Physical  Exam Constitutional:      Appearance: Normal appearance.  HENT:     Mouth/Throat:     Pharynx: No oropharyngeal exudate or posterior oropharyngeal erythema.  Eyes:     Conjunctiva/sclera: Conjunctivae normal.     Pupils: Pupils are equal, round, and reactive to light.  Cardiovascular:     Rate and Rhythm: Normal rate and regular rhythm.     Pulses: Normal pulses.     Heart sounds: No murmur heard.    No gallop.     Comments: HR ~60 Pulmonary:     Effort: Pulmonary effort is normal.  Breath sounds: Normal breath sounds. No wheezing or rales.  Abdominal:     Palpations: Abdomen is soft.     Tenderness: There is no abdominal tenderness.  Musculoskeletal:     Cervical back: Neck supple.     Right lower leg: No edema.     Left lower leg: No edema.  Lymphadenopathy:     Cervical: No cervical adenopathy.  Skin:    Findings: No lesion or rash.  Neurological:     General: No focal deficit present.     Mental Status: He is alert and oriented to person, place, and time.     Comments: Word naming--- 8/1 minute Recall 3/3  Psychiatric:        Mood and Affect: Mood normal.        Behavior: Behavior normal.            Assessment & Plan:

## 2022-09-25 NOTE — Assessment & Plan Note (Signed)
Recommended famotidine 20mg  prn for symptoms

## 2022-09-25 NOTE — Assessment & Plan Note (Signed)
BP Readings from Last 3 Encounters:  09/25/22 130/78  02/03/22 (!) 150/80  12/22/21 (!) 170/70   Controlled on olmesartan/HCTZ, amlodipine 5mg  daily

## 2022-09-25 NOTE — Assessment & Plan Note (Signed)
No symptoms Asked him to restart ASA 81mg ---but only every other day Rosuvastatin 20, olmesartan 40/25 (HCTZ)

## 2022-09-25 NOTE — Assessment & Plan Note (Signed)
I have personally reviewed the Medicare Annual Wellness questionnaire and have noted 1. The patient's medical and social history 2. Their use of alcohol, tobacco or illicit drugs 3. Their current medications and supplements 4. The patient's functional ability including ADL's, fall risks, home safety risks and hearing or visual             impairment. 5. Diet and physical activities 6. Evidence for depression or mood disorders  The patients weight, height, BMI and visual acuity have been recorded in the chart I have made referrals, counseling and provided education to the patient based review of the above and I have provided the pt with a written personalized care plan for preventive services.  I have provided you with a copy of your personalized plan for preventive services. Please take the time to review along with your updated medication list.  Healthy Works out regularly --- discussed resistance No cancer screening due to age Flu/RSV in the fall Recommended updated COVID vaccine anytime now

## 2022-09-25 NOTE — Assessment & Plan Note (Signed)
Mild and improved last year On olmesartan

## 2022-10-18 DIAGNOSIS — H1032 Unspecified acute conjunctivitis, left eye: Secondary | ICD-10-CM | POA: Diagnosis not present

## 2022-11-06 DIAGNOSIS — H524 Presbyopia: Secondary | ICD-10-CM | POA: Diagnosis not present

## 2022-11-06 DIAGNOSIS — H5213 Myopia, bilateral: Secondary | ICD-10-CM | POA: Diagnosis not present

## 2022-11-06 DIAGNOSIS — H52203 Unspecified astigmatism, bilateral: Secondary | ICD-10-CM | POA: Diagnosis not present

## 2022-11-06 DIAGNOSIS — H401132 Primary open-angle glaucoma, bilateral, moderate stage: Secondary | ICD-10-CM | POA: Diagnosis not present

## 2022-12-08 ENCOUNTER — Other Ambulatory Visit: Payer: Self-pay | Admitting: Internal Medicine

## 2022-12-12 ENCOUNTER — Other Ambulatory Visit: Payer: Self-pay | Admitting: Internal Medicine

## 2022-12-12 ENCOUNTER — Telehealth: Payer: Self-pay | Admitting: Internal Medicine

## 2022-12-12 ENCOUNTER — Other Ambulatory Visit: Payer: Self-pay | Admitting: Cardiovascular Disease

## 2022-12-12 MED ORDER — KETOCONAZOLE 2 % EX SHAM: 1.0000 | MEDICATED_SHAMPOO | CUTANEOUS | 5 refills | Status: DC

## 2022-12-12 NOTE — Telephone Encounter (Signed)
Prescription Request  12/12/2022  LOV: 09/25/2022  What is the name of the medication or equipment?  ketoconazole (NIZORAL) 2 % shampoo   Have you contacted your pharmacy to request a refill? No   Which pharmacy would you like this sent to?   [id number], [place] - [address] [place] [address] Phone: [phone] Fax: [phone]    Patient notified that their request is being sent to the clinical staff for review and that they should receive a response within 2 business days.   Please advise at Mobile [phone] (mobile)

## 2022-12-12 NOTE — Telephone Encounter (Signed)
Rx sent electronically.  

## 2022-12-13 ENCOUNTER — Ambulatory Visit: Payer: Medicare Other | Admitting: Cardiovascular Disease

## 2022-12-16 DIAGNOSIS — D2262 Melanocytic nevi of left upper limb, including shoulder: Secondary | ICD-10-CM | POA: Diagnosis not present

## 2022-12-16 DIAGNOSIS — L570 Actinic keratosis: Secondary | ICD-10-CM | POA: Diagnosis not present

## 2022-12-16 DIAGNOSIS — D225 Melanocytic nevi of trunk: Secondary | ICD-10-CM | POA: Diagnosis not present

## 2022-12-16 DIAGNOSIS — L820 Inflamed seborrheic keratosis: Secondary | ICD-10-CM | POA: Diagnosis not present

## 2022-12-16 DIAGNOSIS — L218 Other seborrheic dermatitis: Secondary | ICD-10-CM | POA: Diagnosis not present

## 2022-12-16 DIAGNOSIS — D1801 Hemangioma of skin and subcutaneous tissue: Secondary | ICD-10-CM | POA: Diagnosis not present

## 2022-12-16 DIAGNOSIS — L821 Other seborrheic keratosis: Secondary | ICD-10-CM | POA: Diagnosis not present

## 2022-12-16 DIAGNOSIS — L814 Other melanin hyperpigmentation: Secondary | ICD-10-CM | POA: Diagnosis not present

## 2023-02-12 DIAGNOSIS — H401132 Primary open-angle glaucoma, bilateral, moderate stage: Secondary | ICD-10-CM | POA: Diagnosis not present

## 2023-03-08 ENCOUNTER — Other Ambulatory Visit: Payer: Self-pay | Admitting: Internal Medicine

## 2023-03-10 NOTE — Progress Notes (Unsigned)
Sheryn Bison Bankson Date of Birth  02/04/1941       [facility] Office     [address]   [place], [place]  [address]    [phone]  Problem List: 1. Coronary artery disease-moderate.  He's had a previous stent placed. 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 A subsequent Myoview study was normal. 2. Hypertension 3. Hyperlipidemia 4. Sleep apnea  Mr. Mundorf is doing very will.   He has been on a mission trip and to [place].  No further episodes of chest pain.   He hauled 5 loads of hogs last week - his brother raises the hogs.  He is planning on going to [place] over Christmas.  He still does his lawn care business.  He walks at the [employer] - 3 miles a day.  Without symptoms.  November 09, 2012:  Taeveon is doing well.  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   He is a retired Naval architect.   Still walking 3 days a week.    Oct. 9, 2015:  Still very active - mows 8-9 yards a week.  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  No angina. BP is high. Has had a cold - is taking chlorpheneramine  Has had normal Bp at home   Sep 19, 2014:   Still very active.  Drives a truck.  Mows and trims 10 yards a week without any problems.  Has atypical  chest pain with mental stress but never with physical exertion.  Does not restrict his diet.   Nov. 18, 2016  Still working hard ( driving a truck and mowing yards)  Gets more short of breath compared to several years ago  Still eats some salty foods.   Has a lady who is cooking for him  - knows that she puts lots of salt in the food  October 16, 2015:  Joses is seen back for his CAD, HTN, HLD . OSA  Just got back from a cruise in [place] .  Still very active.   Mows yards, still drives trucks.     Dec. 4, 2017:  Zyrion is seen today with [facility]. Still drives his truck and mows regularly .   No real CP or dyspnea.   Very rare episodes of chest tightness.  Keeps his BP at  home.  Seems to go up when he comes to the doctor .   October 31, 2016:  Eathon is seen today for follow up  Had back surgery in January .   Back pain has returned . Still very busy,   Mows frequently .   Has a [employer] that he loves but seems to contribute to his back pain.   No angnia  BP at home has been mildly elevated.   Admits that he eats more salt than he should .   Jan. 15, 2019:   BP is a little elevate.  No dyspnea. , no CP  Eating a bit of extra salt.  Has not been exercising .   January 26, 2018: Doing well BP at home has been good . Keeps a BP log. BP is a bit high today  - ate some hotdogs this weekend .  No CP . Exercising some .   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  Did  lots of walking in [place]   May 17, 2019:  What is seen today for follow-up of his mild coronary artery disease, hypertension, hyperlipidemia. 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 Always a bit high when he comes in to the office    Jan. 3, 2022 Eagles is seen today for follow up of his CAD , HTN, HLD No cp  Still working very hard.  Cleaned out 8 hen houses over the past week or so .  Walks 3 miles a day several times a week .  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 No CP  No dyspnea   Had palpitations  - would wake him up Last 15 minutes.  Was occurring frequenlty,  now has not had an episode for 10-12 days  We discussed a monitor but now he is not having any palpitation .  Will try propranolol  Toprol xl was called in but he is not taking it   Nov. 2, 2022 Woke up last week , room was spinning  Had a spontaneous nose bleed  Not constant  He denies any chest pain or shortness of breath.   He has been able to walk for many miles.  He rides his bike on a regular basis.  He is not had any cardiac issues.  HR has been slow  Symptoms sound more like vertigo   November 19, 2021 Inoke is seen for follow up of his CAD, HTN,    No CP  Is sore the next day  after working  Helped a man build a block wall  this past week - showed me pics.  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   BP and HR are stable    Oct. 28, 2024 Alecxander is seen for follow up visit for his HTN, CAD Has sinus bradycardia at baseline  Has had an episode of feeling faint , has happened twice  Has some unsteadiness   Baseline HR 45  1 minutes step test  --> HR of 80 No symptoms       Current Outpatient Medications on File Prior to Visit  Medication Sig Dispense Refill   amLODipine (NORVASC) 5 MG tablet TAKE 1 TABLET BY MOUTH EVERY DAY 90 tablet 3   cetirizine (ZYRTEC) 10 MG tablet Take 10 mg by mouth daily as needed for allergies.     fluocinonide (LIDEX) 0.05 % external solution Apply 1 Application topically 2 (two) times daily.     ketoconazole (NIZORAL) 2 % shampoo Apply 1 Application topically 2 (two) times a week. 120 mL 5   latanoprost (XALATAN) 0.005 % ophthalmic solution Place 1 drop into both eyes at bedtime.      olmesartan-hydrochlorothiazide (BENICAR HCT) 40-25 MG tablet Take 1 tablet by mouth daily. Pt needs to keep upcoming appt in Oct for further refills 90 tablet 0   rosuvastatin (CRESTOR) 20 MG tablet TAKE 0.5 TABLETS (10 MG TOTAL) BY MOUTH DAILY. PT NEEDS TO KEEP UPCOMING APPT IN OCT FOR FURTHER REFILLS 45 tablet 2   timolol (TIMOPTIC) 0.5 % ophthalmic solution 1 drop 2 (two) times  daily.     triamcinolone cream (KENALOG) 0.1 % Apply 1 application topically as needed (skin irritation).  0   No current facility-administered medications on file prior to visit.    Allergies   Allergen Reactions   Atorvastatin Other (See Comments)    MYALGIAS, LEG CRAMPING   Ezetimibe-Simvastatin Other (See Comments)    MYALGIAS, CRAMPING   Lisinopril Cough   Pravastatin Other (See Comments)    Myalgias at 40mg  dosage    Past Medical History:  Diagnosis Date   Allergy    Aortic root dilatation (HCC)    Mild by echo 11/2011 (39mm)   BPH (benign prostatic hypertrophy)    Bradycardia    Precluding BB use.   CAD (coronary artery disease)    a. s/p Taxus DES to RCA and PDA in 2006 .  // b. Myoview 8/11: Walked 12:37. EF 57%. no ischemia or scar.  // c. Mod CAD by cath 11/2011 (40-50% ISR of PDA -- for OP myoview to assess).  //  d. Myoview 3/18: EF 58, no ischemia; Low Risk   Colon polyps    Complication of anesthesia    "they said they about lost, me when I had eye surgery"   Diverticulitis    GERD (gastroesophageal reflux disease)    Glaucoma    Hyperlipidemia    Hypertension    Impaired fasting glucose    Sleep apnea    Syncope     Past Surgical History:  Procedure Laterality Date   ANGIOPLASTY     percutaneous transluminal coronary  angioplasty and stenting of the right coronary artery and posterior    CATARACT EXTRACTION W/ INTRAOCULAR LENS  IMPLANT, BILATERAL  9/15   COLONOSCOPY     INTRAOCULAR LENS INSERTION     LEFT HEART CATH AND CORONARY ANGIOGRAPHY N/A 10/14/2018   Procedure: LEFT HEART CATH AND CORONARY ANGIOGRAPHY;  Surgeon: Swaziland, Peter M, MD;  Location: [facility] INVASIVE CV LAB;  Service: Cardiovascular;  Laterality: N/A;   LEFT HEART CATHETERIZATION WITH CORONARY ANGIOGRAM N/A 11/26/2011   Procedure: LEFT HEART CATHETERIZATION WITH CORONARY ANGIOGRAM;  Surgeon: Vesta Mixer, MD;  Location: [facility] CATH LAB;  Service: Cardiovascular;  Laterality: N/A;   LUMBAR LAMINECTOMY/DECOMPRESSION MICRODISCECTOMY Left 05/27/2016   Procedure: Laminectomy for facet/synovial cyst - Lumbar four - Lumbar five - left;  Surgeon: Donalee Citrin, MD;  Location: [facility] OR;  Service:  Neurosurgery;  Laterality: Left;  Laminectomy for facet/synovial cyst - Lumbar four - Lumbar five - left   PILONIDAL CYST / SINUS EXCISION      Social History   Tobacco Use  Smoking Status Former   Current packs/day: 0.00   Types: Cigarettes   Start date: 05/13/1948   Quit date: 05/13/1978   Years since quitting: 44.8   Passive exposure: Past  Smokeless Tobacco Never    Social History   Substance and Sexual Activity  Alcohol Use Yes   Alcohol/week: 14.0 standard drinks of alcohol   Types: 14 Glasses of wine per week    Family History  Problem Relation Age of Onset   Coronary artery disease Father    Heart disease Father    Heart failure Father    Cancer Mother        ovarian cancer   Stroke Son    Hypertension Neg Hx    Diabetes Neg Hx     Reviw of Systems:  Noted in current history, otherwise systems are negative.  Physical Exam: Blood pressure (!) 152/86, pulse (!) 43,  height 5\' 7"  (1.702 m), weight 181 lb 3.2 oz (82.2 kg), SpO2 97%.  HYPERTENSION CONTROL Vitals:   03/11/23 0804 03/11/23 0825  BP: (!) 152/86 (!) 152/86    The patient's blood pressure is elevated above target today.  In order to address the patient's elevated BP: Blood pressure will be monitored at home to determine if medication changes need to be made.       GEN:  Well nourished, well developed in no acute distress HEENT: Normal NECK: No JVD; No carotid bruits LYMPHATICS: No lymphadenopathy CARDIAC: RRR , no murmurs, rubs, gallops RESPIRATORY:  Clear to auscultation without rales, wheezing or rhonchi  ABDOMEN: Soft, non-tender, non-distended MUSCULOSKELETAL:  No edema; No deformity  SKIN: Warm and dry NEUROLOGIC:  Alert and oriented x 3    ECG:    EKG Interpretation Date/Time:  Tuesday March 11 2023 08:10:04 EDT Ventricular Rate:  46 PR Interval:  196 QRS Duration:  118 QT Interval:  456 QTC Calculation: 399 R Axis:   -3  Text Interpretation: Sinus bradycardia with marked  sinus arrhythmia Non-specific intra-ventricular conduction delay Minimal voltage criteria for LVH, may be normal variant ( Cornell product ) When compared with ECG of 21-Nov-2020 20:03, Vent. rate has decreased BY  30 BPM Confirmed by Kristeen Miss ([id number]) on 03/11/2023 8:18:41 AM     Assessment / Plan:   1. Coronary artery disease-moderate.     2.  Sinus bradycardia: Jakaden presents today with baseline sinus bradycardia.  When we performed a 1 minute step test. Baseline HR 45  1 minutes step test  --> HR of 80 No symptoms   This shows that his chronotropic response is intact.  He does recall an episode of dizziness several months ago but has not recurred.  I have advised him to let us know if this becomes a pattern.  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  Will get a 7-day ZIO monitor to evaluate his heart rate.  I would have a low threshold to refer him to electrophysiology for consideration for pacemaker if he should have significant bradycardia.  2. Hypertension-        blood pressure is slightly elevated today.  I suspect this is because of his sinus bradycardia.  I worry that if we try to add some vasodilators to his medical regimen that he would become symptomatic right away.  Continue to monitor.  I will see him again in 3 months for follow-up visit.  3.  Hyperlipidemia: Stable.  4. Vertigo :          Kristeen Miss, MD  03/11/2023 10:31 AM    [facility] [address] [place],  [address] [place], [place]  [address] Pager [phone] Phone: [phone]; Fax: [phone]

## 2023-03-11 ENCOUNTER — Ambulatory Visit: Payer: Medicare Other | Attending: Cardiovascular Disease | Admitting: Cardiovascular Disease

## 2023-03-11 ENCOUNTER — Encounter: Payer: Self-pay | Admitting: Cardiovascular Disease

## 2023-03-11 ENCOUNTER — Ambulatory Visit: Payer: Medicare Other

## 2023-03-11 VITALS — BP 152/86 | HR 43 | Ht 67.0 in | Wt 181.2 lb

## 2023-03-11 DIAGNOSIS — I10 Essential (primary) hypertension: Secondary | ICD-10-CM

## 2023-03-11 DIAGNOSIS — R001 Bradycardia, unspecified: Secondary | ICD-10-CM | POA: Diagnosis not present

## 2023-03-11 NOTE — Progress Notes (Unsigned)
Enrolled patient for a 7 day Zio XT monitor to be mailed to patients home.  

## 2023-03-11 NOTE — Patient Instructions (Signed)
Testing/Procedures: Zio heart monitor x 7 days Your physician has recommended that you wear an event monitor. Event monitors are medical devices that record the heart's electrical activity. Doctors most often [place] these monitors to diagnose arrhythmias. Arrhythmias are problems with the speed or rhythm of the heartbeat. The monitor is a small, portable device. You can wear one while you do your normal daily activities. This is usually used to diagnose what is causing palpitations/syncope (passing out).  Follow-Up: At [facility], you and your health needs are our priority.  As part of our continuing mission to provide you with exceptional heart care, we have created designated Provider Care Teams.  These Care Teams include your primary Cardiologist (physician) and Advanced Practice Providers (APPs -  Physician Assistants and Nurse Practitioners) who all work together to provide you with the care you need, when you need it.  Your next appointment:   3 month(s)  Provider:   Kristeen Miss, MD

## 2023-03-23 ENCOUNTER — Telehealth: Payer: Self-pay | Admitting: Physician Assistant

## 2023-03-23 DIAGNOSIS — R55 Syncope and collapse: Secondary | ICD-10-CM

## 2023-03-23 NOTE — Telephone Encounter (Signed)
Pt saw Dr Elease Hashimoto and timolol eye drops were d/c'd because of HR 40s 10/30  After that, HR improved until 2 days ago, 11/07, HR dropped to 50s, then into 40s the next day.   He was walking, had walked about 2 miles and then everything went black and he fell.   LOC was very brief, no Sz activity, no post-ictal state.   143/64, 64 today  He has had some general malaise, but no specific symptoms.    His blood pressure has been up and down with a systolic as high as 190, but is generally high normal.  He has a pulse ox, and will start checking his heart rate when he feels bad.  Plan: He has a pulse ox and will start checking his heart rate frequently.  Especially if he feels bad. He lives alone, and is encouraged to consider Life Alert or other method of summoning help when needed. He was actually visiting his daughter in [place] when all of this happened, he is on his way home now and will put on the monitor as soon as he gets home.  They will call the office on Monday if he is feeling bad and see when they can be seen.  I did advise that there were no early morning appointments available on Monday that I knew of.  Will route this to Dr. Elease Hashimoto to see if he can be evaluated sooner.  Advised him that he should not drive for 6 months after syncope, per [facility].  Advised him that if he feels bad and especially if heart rate and blood pressure are abnormal, the ER may be the best option.  Theodore Demark, PA-C 03/23/2023 11:54 AM

## 2023-03-24 ENCOUNTER — Ambulatory Visit (INDEPENDENT_AMBULATORY_CARE_PROVIDER_SITE_OTHER): Payer: Medicare Other

## 2023-03-24 ENCOUNTER — Ambulatory Visit: Payer: Medicare Other | Attending: Cardiology | Admitting: Cardiology

## 2023-03-24 ENCOUNTER — Encounter: Payer: Self-pay | Admitting: Cardiology

## 2023-03-24 VITALS — BP 142/68 | HR 64 | Ht 67.0 in | Wt 183.0 lb

## 2023-03-24 DIAGNOSIS — R55 Syncope and collapse: Secondary | ICD-10-CM | POA: Diagnosis not present

## 2023-03-24 DIAGNOSIS — I2510 Atherosclerotic heart disease of native coronary artery without angina pectoris: Secondary | ICD-10-CM

## 2023-03-24 DIAGNOSIS — R001 Bradycardia, unspecified: Secondary | ICD-10-CM | POA: Diagnosis not present

## 2023-03-24 NOTE — Addendum Note (Signed)
Addended by: Lars Mage on: 03/24/2023 12:03 PM   Modules accepted: Orders

## 2023-03-24 NOTE — Progress Notes (Addendum)
Electrophysiology Office Note:   Date:  03/24/2023  ID:  Patrick [name], DOB 1940-10-13, MRN [id number]  Primary Cardiologist: Kristeen Miss, MD Electrophysiologist: Nobie Putnam, MD      History of Present Illness:   Patrick [name] is a 82 y.o. male with h/o coronary artery disease status post PCI, hypertension, hyperlipidemia, and sleep apnea who is being seen today for evaluation of syncope.  Patient had a couple of episodes of feeling "faint: in the background of a sinus bradycardia, so Zio monitor was ordered by his general cardiologist.  Before Zio monitor could be worn, patient was visiting [place] with his daughter and had a brief syncopal event.  Patient reports that him and his daughter had walked approximately 2 miles outside in the evening when he suddenly lost consciousness.  His daughter reports that he stumbled and she caught him before hitting the ground.  She thought that he had just tripped, but the patient states that he felt a sensation like "the lights had been turned off".  He reportedly was fine immediately following the event without any confusion and quickly continued on his way walking to dinner.  He has otherwise been doing well and has no limitations with his daily activities.  He did recently change his timolol eyedrops to another medication at the recommendation of Dr. Elease Hashimoto because of his baseline bradycardia.  And his heart rate has increased significantly.  He denies any episodes of chest pain.  He has no new or acute complaints today.  Review of systems complete and found to be negative unless listed in HPI.   EP Information / Studies Reviewed:    EKG is ordered today. Personal review as below.  EKG Interpretation Date/Time:  Monday March 24 2023 14:37:51 EST Ventricular Rate:  64 PR Interval:  198 QRS Duration:  116 QT Interval:  430 QTC Calculation: 443 R Axis:   -19  Text Interpretation: Normal sinus rhythm Minimal voltage criteria for LVH, may be  normal variant ( Cornell product ) When compared with ECG of 11-Mar-2023 08:10,heart rate has increased. Confirmed by Nobie Putnam [phone]) on 03/24/2023 2:46:15 PM   EKG 03/11/2023:    LHC/RHC 10/14/18:  Mid LM lesion is 25% stenosed. Dist LAD lesion is 50% stenosed. Prox RCA lesion is 10% stenosed. Mid RCA lesion is 35% stenosed. RPDA-2 lesion is 30% stenosed. RPDA-1 lesion is 50% stenosed. LV end diastolic pressure is normal.   1. Nonobstructive CAD. Compared to prior angiogram in 2014 there is no significant change. 2. Normal LVEDP  Physical Exam:   VS:  BP (!) 142/68   Pulse 64   Ht 5\' 7"  (1.702 m)   Wt 183 lb (83 kg)   SpO2 95%   BMI 28.66 kg/m    Wt Readings from Last 3 Encounters:  03/24/23 183 lb (83 kg)  03/11/23 181 lb 3.2 oz (82.2 kg)  09/25/22 181 lb 2 oz (82.2 kg)     GEN: Well nourished, well developed in no acute distress NECK: No JVD; No carotid bruits CARDIAC: Regular rate and rhythm,. RESPIRATORY:  Clear to auscultation without rales, wheezing or rhonchi  ABDOMEN: Soft, non-tender, non-distended EXTREMITIES:  No edema; No deformity   ASSESSMENT AND PLAN:   Patrick [name] is a 82 y.o. male with h/o coronary artery disease status post PCI, hypertension, hyperlipidemia, and sleep apnea who is being seen today for evaluation of syncope.  Etiology of his syncope is not entirely clear.  This episode appears to be very  brief.  He had been walking approximately 2 miles when the episode occurred.  He states that he might have been dehydrated during this episode as he had only drank coffee and wine that day.  The sensation of feeling like he had " the lights turned out" is more suspicious for an arrhythmic origin, but unfortunately he was not wearing his Zio monitor at that time.  After stopping his timolol eyedrops his heart rate has increased and his ECG today shows sinus rhythm in the 60s.  A live monitor has been placed on him today in  clinic.  #Syncope #Bradycardia -No obvious indication for permanent pacer implant at this time.   -ECG is normal sinus rhythm with a rate of 64 bpm, a normal PR interval, and normal QRS duration. -Agree with live monitor for closer evaluation for bradycardia and/or evidence of conduction disease was not seen on EKG. -Encouraged adequate fluid hydration. -Instructed patient that by [facility] law he should not drive for the next 6 months.  #CAD s/p PCI: -Patient denies any episodes of chest pain.  He can perform his usual activities without difficulty.   -He has no known history of ventricular arrhythmia.  If his live monitor were to show PVCs or NSVT, then repeating a nuclear stress to assess for ischemia and/or evidence of myocardial scar would be warranted.  Follow up with primary cardiologist Dr. Elease Hashimoto.  If live monitor shows symptomatic bradycardia or conduction disease, then I am happy to see the patient for pacemaker implant.   Total time of encounter: 60 minutes total time of encounter, including chart review, face-to-face patient care, coordination of care and counseling regarding high complexity medical decision making.   Signed, Nobie Putnam, MD

## 2023-03-24 NOTE — Telephone Encounter (Signed)
Pt needs to be seen by EP .   I suspect that his syncope was related to bradycardia / prolonged pause He needs a live event monitor placed today I have openings on my DOD day on Friday but he should be seen sooner  if possible PN   Called and spoke with patient and daughter via speaker phone. Pt coming to office today to get live heart monitor placed (zio will be removed) and referral to EP placed at this time. Jimmey Ralph is DOD today and has agreed to see him at 2:30.

## 2023-03-24 NOTE — Patient Instructions (Addendum)
Medication Instructions:  Your physician recommends that you continue on your current medications as directed. Please refer to the Current Medication list given to you today.  *If you need a refill on your cardiac medications before your next appointment, please call your pharmacy*  Follow-Up: At [facility], you and your health needs are our priority.  As part of our continuing mission to provide you with exceptional heart care, we have created designated Provider Care Teams.  These Care Teams include your primary Cardiologist (physician) and Advanced Practice Providers (APPs -  Physician Assistants and Nurse Practitioners) who all work together to provide you with the care you need, when you need it.  Your next appointment:   As needed based on results of your heart monitor  Provider:   You may see Nobie Putnam, MD or one of the following Advanced Practice Providers on your designated Care Team:   Francis Dowse, [place] [address] [place], [place] Sherie Don, NP Canary Brim, NP

## 2023-03-28 ENCOUNTER — Ambulatory Visit: Payer: Medicare Other | Admitting: Cardiovascular Disease

## 2023-04-01 ENCOUNTER — Encounter: Payer: Self-pay | Admitting: Internal Medicine

## 2023-04-01 ENCOUNTER — Ambulatory Visit (INDEPENDENT_AMBULATORY_CARE_PROVIDER_SITE_OTHER): Payer: Medicare Other | Admitting: Internal Medicine

## 2023-04-01 VITALS — BP 122/66 | HR 55 | Temp 98.3°F | Ht 67.0 in | Wt 182.0 lb

## 2023-04-01 DIAGNOSIS — G4489 Other headache syndrome: Secondary | ICD-10-CM | POA: Diagnosis not present

## 2023-04-01 DIAGNOSIS — R55 Syncope and collapse: Secondary | ICD-10-CM | POA: Diagnosis not present

## 2023-04-01 DIAGNOSIS — R519 Headache, unspecified: Secondary | ICD-10-CM | POA: Insufficient documentation

## 2023-04-01 DIAGNOSIS — R269 Unspecified abnormalities of gait and mobility: Secondary | ICD-10-CM | POA: Diagnosis not present

## 2023-04-01 LAB — RENAL FUNCTION PANEL
Albumin: 4.5 g/dL (ref 3.5–5.2)
BUN: 23 mg/dL (ref 6–23)
CO2: 26 meq/L (ref 19–32)
Calcium: 9.7 mg/dL (ref 8.4–10.5)
Chloride: 104 meq/L (ref 96–112)
Creatinine, Ser: 1.11 mg/dL (ref 0.40–1.50)
GFR: 61.9 mL/min (ref >60.00)
Glucose, Bld: 111 mg/dL — ABNORMAL HIGH (ref 70–99)
Phosphorus: 3 mg/dL (ref 2.3–4.6)
Potassium: 4.2 meq/L (ref 3.5–5.1)
Sodium: 139 meq/L (ref 135–145)

## 2023-04-01 LAB — CBC
HCT: 42.9 % (ref 39.0–52.0)
Hemoglobin: 14.2 g/dL (ref 13.0–17.0)
MCHC: 33 g/dL (ref 30.0–36.0)
MCV: 94.1 fL (ref 78.0–100.0)
Platelets: 266 10*3/uL (ref 150.0–400.0)
RBC: 4.56 Mil/uL (ref 4.22–5.81)
RDW: 13.6 % (ref 11.5–15.5)
WBC: 5.9 10*3/uL (ref 4.0–10.5)

## 2023-04-01 LAB — HEPATIC FUNCTION PANEL
ALT: 14 U/L (ref 0–53)
AST: 14 U/L (ref 0–37)
Albumin: 4.5 g/dL (ref 3.5–5.2)
Alkaline Phosphatase: 79 U/L (ref 39–117)
Bilirubin, Direct: 0.1 mg/dL (ref 0.0–0.3)
Total Bilirubin: 0.5 mg/dL (ref 0.2–1.2)
Total Protein: 7 g/dL (ref 6.0–8.3)

## 2023-04-01 LAB — VITAMIN B12: Vitamin B-12: 253 pg/mL (ref 211–911)

## 2023-04-01 LAB — TSH: TSH: 1.49 u[IU]/mL (ref 0.35–5.50)

## 2023-04-01 NOTE — Assessment & Plan Note (Signed)
May have had bradycardia then--better off the timolol Cardiac monitor on --per cardiology

## 2023-04-01 NOTE — Assessment & Plan Note (Signed)
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

## 2023-04-01 NOTE — Assessment & Plan Note (Signed)
Seems like muscle tension headache Discussed trying heat wrap for neck If persists, would refer to neurology

## 2023-04-01 NOTE — Progress Notes (Signed)
Subjective:    Patient ID: Sheryn Bison Hanger, male    DOB: 1940/07/21, 82 y.o.   MRN: [id number]  HPI Here with friend to review recent fainting spell  He has also had some headaches since this recent spell Some blurred vision as well Notices it at church--does the collection and he has trouble "walking a straight line" Did have HR 45 at first cardiology office Did stop the timolol---and rate went up after that  Headaches is in the back of his head No particular time--not related to eating Not in bed Achy Does go away on its own--no meds. Goes away after 1-2 hours Stays active--mowing and running pressure washer  Does have "prickly feeling" in his feet  Current Outpatient Medications on File Prior to Visit  Medication Sig Dispense Refill   amLODipine (NORVASC) 5 MG tablet TAKE 1 TABLET BY MOUTH EVERY DAY 90 tablet 3   brinzolamide (AZOPT) 1 % ophthalmic suspension Place 1 drop into both eyes 2 (two) times daily.     cetirizine (ZYRTEC) 10 MG tablet Take 10 mg by mouth daily as needed for allergies.     fluocinonide (LIDEX) 0.05 % external solution Apply 1 Application topically 2 (two) times daily.     ketoconazole (NIZORAL) 2 % shampoo Apply 1 Application topically 2 (two) times a week. 120 mL 5   latanoprost (XALATAN) 0.005 % ophthalmic solution Place 1 drop into both eyes at bedtime.      olmesartan-hydrochlorothiazide (BENICAR HCT) 40-25 MG tablet Take 1 tablet by mouth daily. Pt needs to keep upcoming appt in Oct for further refills 90 tablet 0   rosuvastatin (CRESTOR) 20 MG tablet TAKE 0.5 TABLETS (10 MG TOTAL) BY MOUTH DAILY. PT NEEDS TO KEEP UPCOMING APPT IN OCT FOR FURTHER REFILLS 45 tablet 2   No current facility-administered medications on file prior to visit.    Allergies  Allergen Reactions   Atorvastatin Other (See Comments)    MYALGIAS, LEG CRAMPING   Ezetimibe-Simvastatin Other (See Comments)    MYALGIAS, CRAMPING   Lisinopril Cough   Pravastatin Other (See  Comments)    Myalgias at 40mg  dosage    Past Medical History:  Diagnosis Date   Allergy    Aortic root dilatation (HCC)    Mild by echo 11/2011 (39mm)   BPH (benign prostatic hypertrophy)    Bradycardia    Precluding BB use.   CAD (coronary artery disease)    a. s/p Taxus DES to RCA and PDA in 2006 .  // b. Myoview 8/11: Walked 12:37. EF 57%. no ischemia or scar.  // c. Mod CAD by cath 11/2011 (40-50% ISR of PDA -- for OP myoview to assess).  //  d. Myoview 3/18: EF 58, no ischemia; Low Risk   Colon polyps    Complication of anesthesia    "they said they about lost, me when I had eye surgery"   Diverticulitis    GERD (gastroesophageal reflux disease)    Glaucoma    Hyperlipidemia    Hypertension    Impaired fasting glucose    Sleep apnea    Syncope     Past Surgical History:  Procedure Laterality Date   ANGIOPLASTY     percutaneous transluminal coronary  angioplasty and stenting of the right coronary artery and posterior    CATARACT EXTRACTION W/ INTRAOCULAR LENS  IMPLANT, BILATERAL  9/15   COLONOSCOPY     INTRAOCULAR LENS INSERTION     LEFT HEART CATH AND CORONARY ANGIOGRAPHY N/A  10/14/2018   Procedure: LEFT HEART CATH AND CORONARY ANGIOGRAPHY;  Surgeon: Swaziland, Peter M, MD;  Location: [facility] INVASIVE CV LAB;  Service: Cardiovascular;  Laterality: N/A;   LEFT HEART CATHETERIZATION WITH CORONARY ANGIOGRAM N/A 11/26/2011   Procedure: LEFT HEART CATHETERIZATION WITH CORONARY ANGIOGRAM;  Surgeon: Vesta Mixer, MD;  Location: [facility] CATH LAB;  Service: Cardiovascular;  Laterality: N/A;   LUMBAR LAMINECTOMY/DECOMPRESSION MICRODISCECTOMY Left 05/27/2016   Procedure: Laminectomy for facet/synovial cyst - Lumbar four - Lumbar five - left;  Surgeon: Donalee Citrin, MD;  Location: [facility] OR;  Service: Neurosurgery;  Laterality: Left;  Laminectomy for facet/synovial cyst - Lumbar four - Lumbar five - left   PILONIDAL CYST / SINUS EXCISION      Family History  Problem Relation Age of Onset   Coronary  artery disease Father    Heart disease Father    Heart failure Father    Cancer Mother        ovarian cancer   Stroke Son    Hypertension Neg Hx    Diabetes Neg Hx     Social History   Socioeconomic History   Marital status: Divorced    Spouse name: Not on file   Number of children: 3   Years of education: Not on file   Highest education level: Not on file  Occupational History   Occupation: driver part time    Employer: retired  Tobacco Use   Smoking status: Former    Current packs/day: 0.00    Types: Cigarettes    Start date: 05/13/1948    Quit date: 05/13/1978    Years since quitting: 44.9    Passive exposure: Past   Smokeless tobacco: Never  Vaping Use   Vaping status: Never Used  Substance and Sexual Activity   Alcohol use: Yes    Alcohol/week: 14.0 standard drinks of alcohol    Types: 14 Glasses of wine per week   Drug use: No   Sexual activity: Yes  Other Topics Concern   Not on file  Social History Narrative   Has living will   Daughter [name] should make medical decisions if he can't   Would accept resuscitation   Wouldn't want prolonged tube feeds if cognitively unaware   Social Determinants of Health   Financial Resource Strain: Not on file  Food Insecurity: Not on file  Transportation Needs: Not on file  Physical Activity: Not on file  Stress: Not on file  Social Connections: Not on file  Intimate Partner Violence: Not on file   Review of Systems Keeps up with eye doctor for the glaucoma--now on brinzolamide Eating okay  Sleeps fine Weight is stable     Objective:   Physical Exam Constitutional:      Appearance: Normal appearance.  Cardiovascular:     Rate and Rhythm: Normal rate and regular rhythm.     Heart sounds: No murmur heard.    No gallop.     Comments: HR ~60 Pulmonary:     Effort: Pulmonary effort is normal.     Breath sounds: Normal breath sounds. No wheezing or rales.  Musculoskeletal:     Cervical back: Neck supple.   Lymphadenopathy:     Cervical: No cervical adenopathy.  Neurological:     Mental Status: He is alert and oriented to person, place, and time.     Cranial Nerves: Cranial nerves 2-12 are intact.     Motor: No weakness, tremor or atrophy.     Coordination: Romberg sign negative.  Finger-Nose-Finger Test normal.     Gait: Gait normal.     Comments: Sensation in feet normal            Assessment & Plan:

## 2023-05-22 DIAGNOSIS — H401132 Primary open-angle glaucoma, bilateral, moderate stage: Secondary | ICD-10-CM | POA: Diagnosis not present

## 2023-06-12 ENCOUNTER — Encounter: Payer: Self-pay | Admitting: Cardiovascular Disease

## 2023-06-12 NOTE — Progress Notes (Signed)
 No show  This encounter was created in error - please disregard.

## 2023-06-13 ENCOUNTER — Ambulatory Visit: Payer: Medicare Other | Attending: Cardiovascular Disease | Admitting: Cardiovascular Disease

## 2023-08-15 DIAGNOSIS — H52203 Unspecified astigmatism, bilateral: Secondary | ICD-10-CM | POA: Diagnosis not present

## 2023-08-15 DIAGNOSIS — H401132 Primary open-angle glaucoma, bilateral, moderate stage: Secondary | ICD-10-CM | POA: Diagnosis not present

## 2023-08-15 DIAGNOSIS — H353112 Nonexudative age-related macular degeneration, right eye, intermediate dry stage: Secondary | ICD-10-CM | POA: Diagnosis not present

## 2023-08-15 DIAGNOSIS — H43813 Vitreous degeneration, bilateral: Secondary | ICD-10-CM | POA: Diagnosis not present

## 2023-08-15 DIAGNOSIS — H04123 Dry eye syndrome of bilateral lacrimal glands: Secondary | ICD-10-CM | POA: Diagnosis not present

## 2023-08-15 DIAGNOSIS — H35371 Puckering of macula, right eye: Secondary | ICD-10-CM | POA: Diagnosis not present

## 2023-08-15 DIAGNOSIS — H524 Presbyopia: Secondary | ICD-10-CM | POA: Diagnosis not present

## 2023-08-22 ENCOUNTER — Ambulatory Visit: Attending: Cardiovascular Disease | Admitting: Cardiovascular Disease

## 2023-08-22 ENCOUNTER — Encounter: Payer: Self-pay | Admitting: Cardiovascular Disease

## 2023-08-22 VITALS — BP 150/70 | HR 76 | Resp 16 | Ht 67.0 in | Wt 184.0 lb

## 2023-08-22 DIAGNOSIS — E782 Mixed hyperlipidemia: Secondary | ICD-10-CM

## 2023-08-22 DIAGNOSIS — I2510 Atherosclerotic heart disease of native coronary artery without angina pectoris: Secondary | ICD-10-CM

## 2023-08-22 MED ORDER — OLMESARTAN MEDOXOMIL-HCTZ 40-25 MG PO TABS: 1.0000 | ORAL_TABLET | Freq: Every day | ORAL | 3 refills | Status: DC

## 2023-08-22 NOTE — Patient Instructions (Signed)
 Lab Work: Lipids, ALT, BMET today If you have labs (blood work) drawn today and your tests are completely normal, you will receive your results only by: MyChart Message (if you have MyChart) OR A paper copy in the mail If you have any lab test that is abnormal or we need to change your treatment, we will call you to review the results.  Follow-Up: At [facility], you and your health needs are our priority.  As part of our continuing mission to provide you with exceptional heart care, our providers are all part of one team.  This team includes your primary Cardiologist (physician) and Advanced Practice Providers or APPs (Physician Assistants and Nurse Practitioners) who all work together to provide you with the care you need, when you need it.  Your next appointment:   1 year(s)  Provider:   Kristeen Miss, MD      1st Floor: - Lobby - Registration  - Pharmacy  - Lab - Cafe  2nd Floor: - PV Lab - Diagnostic Testing (echo, CT, nuclear med)  3rd Floor: - Vacant  4th Floor: - TCTS (cardiothoracic surgery) - AFib Clinic - Structural Heart Clinic - Vascular Surgery  - Vascular Ultrasound  5th Floor: - HeartCare Cardiology (general and EP) - Clinical Pharmacy for coumadin, hypertension, lipid, weight-loss medications, and med management appointments    Valet parking services will be available as well.

## 2023-08-22 NOTE — Progress Notes (Signed)
 Patrick [name] Date of Birth  12-Jan-1941       [facility] Office     [address]   [place], [place]  [address]    [phone]  Problem List: 1. Coronary artery disease-moderate.  He's had a previous stent placed. 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 A subsequent Myoview study was normal. 2. Hypertension 3. Hyperlipidemia 4. Sleep apnea  Mr. Patrick [name] is doing very will.   He has been on a mission trip and to [place].  No further episodes of chest pain.   He hauled 5 loads of hogs last week - his brother raises the hogs.  He is planning on going to [place] over Christmas.  He still does his lawn care business.  He walks at the [employer] [place] - 3 miles a day.  Without symptoms.  November 09, 2012:  Patrick [name] is doing well.  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   He is a retired [employer] architect.   Still walking 3 days a week.    Oct. 9, 2015:  Still very active - mows 8-9 yards a week.  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  No angina. BP is high. Has had a cold - is taking chlorpheneramine  Has had normal Bp at home   Sep 19, 2014:   Still very active.  Drives a truck.  Mows and trims 10 yards a week without any problems.  Has atypical  chest pain with mental stress but never with physical exertion.  Does not restrict his diet.   Nov. 18, 2016  Still working hard ( driving a truck and mowing yards)  Gets more short of breath compared to several years ago  Still eats some salty foods.   Has a lady who is cooking for him  - knows that she puts lots of salt in the food  October 16, 2015:  Patrick [name] is seen back for his CAD, HTN, HLD . OSA  Just got back from a cruise in [place] .  Still very active.   Mows yards, still drives trucks.     Dec. 4, 2017:  Patrick [name] is seen today with [facility]. Still drives his truck and mows regularly .   No real CP or dyspnea.   Very rare episodes of chest tightness.  Keeps his BP at  home.  Seems to go up when he comes to the doctor .   October 31, 2016:  Patrick [name] is seen today for follow up  Had back surgery in January .   Back pain has returned . Still very busy,   Mows frequently .   Has a [employer] that he loves but seems to contribute to his back pain.   No angnia  BP at home has been mildly elevated.   Admits that he eats more salt than he should .   Jan. 15, 2019:   BP is a little elevate.  No dyspnea. , no CP  Eating a bit of extra salt.  Has not been exercising .   January 26, 2018: Doing well BP at home has been good . Keeps a BP log. BP is a bit high today  - ate some hotdogs this weekend .  No CP . Exercising some .   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  Did  lots of walking in [place]   May 17, 2019:  What is seen today for follow-up of his mild coronary artery disease, hypertension, hyperlipidemia. 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 Always a bit high when he comes in to the office    Jan. 3, 2022 Patrick [name] is seen today for follow up of his CAD , HTN, HLD No cp  Still working very hard.  Cleaned out 8 hen houses over the past week or so .  Walks 3 miles a day several times a week .  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 No CP  No dyspnea   Had palpitations  - would wake him up Last 15 minutes.  Was occurring frequenlty,  now has not had an episode for 10-12 days  We discussed a monitor but now he is not having any palpitation .  Will try propranolol  Toprol xl was called in but he is not taking it   Nov. 2, 2022 Woke up last week , room was spinning  Had a spontaneous nose bleed  Not constant  He denies any chest pain or shortness of breath.   He has been able to walk for many miles.  He rides his bike on a regular basis.  He is not had any cardiac issues.  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   BP and HR are stable    Oct. 28, 2024 Patrick [name] is seen for follow up visit for his HTN, CAD Has sinus bradycardia at baseline  Has had an episode of feeling faint , has happened twice  Has some unsteadiness   Baseline HR 45  1 minutes step test  --> HR of 80 No symptoms   Jan. 31, 2025 No show    August 22, 2023  Patrick [name] is seen for follow up for hsi HTN, CAD, bradycardia  No CP , no dyspnea  Still working every day .   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     dorzolamide (TRUSOPT) 2 % ophthalmic solution Place 1 drop into both eyes 2 (two) times daily.     fluocinonide (LIDEX) 0.05 % external solution Apply 1 Application topically 2 (two) times daily.     ketoconazole (NIZORAL) 2 % shampoo Apply 1 Application topically 2 (two) times a week. 120 mL 5   latanoprost (XALATAN) 0.005 % ophthalmic solution Place 1 drop into both eyes  at bedtime.      olmesartan-hydrochlorothiazide (BENICAR HCT) 40-25 MG tablet Take 1 tablet by mouth daily. Pt needs to keep upcoming appt in Oct for further refills 90 tablet 0   rosuvastatin (CRESTOR) 20 MG tablet TAKE 0.5 TABLETS (10 MG TOTAL) BY  MOUTH DAILY. PT NEEDS TO KEEP UPCOMING APPT IN OCT FOR FURTHER REFILLS 45 tablet 2   No current facility-administered medications on file prior to visit.    Allergies  Allergen Reactions   Atorvastatin Other (See Comments)    MYALGIAS, LEG CRAMPING   Ezetimibe-Simvastatin Other (See Comments)    MYALGIAS, CRAMPING   Lisinopril Cough   Pravastatin Other (See Comments)    Myalgias at 40mg  dosage    Past Medical History:  Diagnosis Date   Allergy    Aortic root dilatation (HCC)    Mild by echo 11/2011 (39mm)   BPH (benign prostatic hypertrophy)    Bradycardia    Precluding BB use.   CAD (coronary artery disease)    a. s/p Taxus DES to RCA and PDA in 2006 .  // b. Myoview 8/11: Walked 12:37. EF 57%. no ischemia or scar.  // c. Mod CAD by cath 11/2011 (40-50% ISR of PDA -- for OP myoview to assess).  //  d. Myoview 3/18: EF 58, no ischemia; Low Risk   Colon polyps    Complication of anesthesia    "they said they about lost, me when I had eye surgery"   Diverticulitis    GERD (gastroesophageal reflux disease)    Glaucoma    Hyperlipidemia    Hypertension    Impaired fasting glucose    Sleep apnea    Syncope     Past Surgical History:  Procedure Laterality Date   ANGIOPLASTY     percutaneous transluminal coronary  angioplasty and stenting of the right coronary artery and posterior    CATARACT EXTRACTION W/ INTRAOCULAR LENS  IMPLANT, BILATERAL  9/15   COLONOSCOPY     INTRAOCULAR LENS INSERTION     LEFT HEART CATH AND CORONARY ANGIOGRAPHY N/A 10/14/2018   Procedure: LEFT HEART CATH AND CORONARY ANGIOGRAPHY;  Surgeon: Swaziland, Peter M, MD;  Location: [facility] INVASIVE CV LAB;  Service: Cardiovascular;  Laterality: N/A;   LEFT HEART CATHETERIZATION WITH CORONARY ANGIOGRAM N/A 11/26/2011   Procedure: LEFT HEART CATHETERIZATION WITH CORONARY ANGIOGRAM;  Surgeon: Vesta Mixer, MD;  Location: [facility] CATH LAB;  Service: Cardiovascular;  Laterality: N/A;   LUMBAR LAMINECTOMY/DECOMPRESSION  MICRODISCECTOMY Left 05/27/2016   Procedure: Laminectomy for facet/synovial cyst - Lumbar four - Lumbar five - left;  Surgeon: Donalee Citrin, MD;  Location: [facility] OR;  Service: Neurosurgery;  Laterality: Left;  Laminectomy for facet/synovial cyst - Lumbar four - Lumbar five - left   PILONIDAL CYST / SINUS EXCISION      Social History   Tobacco Use  Smoking Status Former   Current packs/day: 0.00   Types: Cigarettes   Start date: 05/13/1948   Quit date: 05/13/1978   Years since quitting: 45.3   Passive exposure: Past  Smokeless Tobacco Never    Social History   Substance and Sexual Activity  Alcohol Use Yes   Alcohol/week: 14.0 standard drinks of alcohol   Types: 14 Glasses of wine per week    Family History  Problem Relation Age of Onset   Coronary artery disease Father    Heart disease Father    Heart failure Father    Cancer Mother        ovarian  cancer   Stroke Son    Hypertension Neg Hx    Diabetes Neg Hx     Reviw of Systems:  Noted in current history, otherwise systems are negative.   Physical Exam: Blood pressure (!) 150/70, pulse 76, resp. rate 16, height 5\' 7"  (1.702 m), weight 184 lb (83.5 kg), SpO2 98%.  HYPERTENSION CONTROL Vitals:   08/22/23 1339 08/22/23 1431  BP: (!) 159/66 (!) 150/70    The patient's blood pressure is elevated above target today.  In order to address the patient's elevated BP: Blood pressure will be monitored at home to determine if medication changes need to be made.       GEN:  Well nourished, well developed in no acute distress HEENT: Normal NECK: No JVD; No carotid bruits LYMPHATICS: No lymphadenopathy CARDIAC: RRR , no murmurs, rubs, gallops RESPIRATORY:  Clear to auscultation without rales, wheezing or rhonchi  ABDOMEN: Soft, non-tender, non-distended MUSCULOSKELETAL:  No edema; No deformity  SKIN: Warm and dry NEUROLOGIC:  Alert and oriented x 3    ECG:          Assessment / Plan:   1. Coronary artery  disease-moderate.    No angina    2.  Sinus bradycardia: Patrick [name] presents today with baseline sinus bradycardia.  When we performed a 1 minute step test. Baseline HR 45  1 minutes step test  --> HR of 80 No symptoms    .  2. Hypertension-         blood pressure is a little elevated today.  He still eats some salty foods.  He insist that his blood pressure is in the normal range when he checks it at home.  He will continue to follow his blood pressure.  3.  Hyperlipidemia: Stable.  Check lipids today.         Kristeen Miss, MD  08/22/2023 2:42 PM    [facility] [address] [place],  [address] [place], [place]  [address] Pager [phone] Phone: [phone]; Fax: [phone]

## 2023-08-23 LAB — BASIC METABOLIC PANEL WITH GFR
BUN/Creatinine Ratio: 16 (ref 10–24)
BUN: 19 mg/dL (ref 8–27)
CO2: 24 mmol/L (ref 20–29)
Calcium: 9.7 mg/dL (ref 8.6–10.2)
Chloride: 103 mmol/L (ref 96–106)
Creatinine, Ser: 1.2 mg/dL (ref 0.76–1.27)
Glucose: 91 mg/dL (ref 70–99)
Potassium: 4.5 mmol/L (ref 3.5–5.2)
Sodium: 140 mmol/L (ref 134–144)
eGFR: 60 mL/min/{1.73_m2} (ref >59)

## 2023-08-23 LAB — LIPID PANEL
Chol/HDL Ratio: 3.5 ratio (ref 0.0–5.0)
Cholesterol, Total: 165 mg/dL (ref 100–199)
HDL: 47 mg/dL (ref >39)
LDL Chol Calc (NIH): 94 mg/dL (ref 0–99)
Triglycerides: 134 mg/dL (ref 0–149)
VLDL Cholesterol Cal: 24 mg/dL (ref 5–40)

## 2023-08-23 LAB — ALT: ALT: 14 IU/L (ref 0–44)

## 2023-08-25 ENCOUNTER — Encounter: Payer: Self-pay | Admitting: Cardiovascular Disease

## 2023-08-26 ENCOUNTER — Other Ambulatory Visit: Payer: Self-pay | Admitting: *Deleted

## 2023-08-26 DIAGNOSIS — E782 Mixed hyperlipidemia: Secondary | ICD-10-CM

## 2023-08-26 DIAGNOSIS — I2510 Atherosclerotic heart disease of native coronary artery without angina pectoris: Secondary | ICD-10-CM

## 2023-08-26 MED ORDER — EZETIMIBE 10 MG PO TABS: 10.0000 mg | ORAL_TABLET | Freq: Every day | ORAL | 3 refills | Status: DC

## 2023-08-26 MED ORDER — OLMESARTAN MEDOXOMIL-HCTZ 40-25 MG PO TABS: 1.0000 | ORAL_TABLET | Freq: Every day | ORAL | 3 refills | Status: AC

## 2023-08-26 NOTE — Progress Notes (Signed)
 Per lab results - order placed for zetia and repeat labs in July.  Pt notified and in agreement.  He would like to use his PCP for blood work.  Orders still placed for [facility] and released.

## 2023-09-30 ENCOUNTER — Ambulatory Visit (INDEPENDENT_AMBULATORY_CARE_PROVIDER_SITE_OTHER)

## 2023-09-30 VITALS — BP 125/62 | Ht 67.0 in | Wt 176.0 lb

## 2023-09-30 DIAGNOSIS — Z2821 Immunization not carried out because of patient refusal: Secondary | ICD-10-CM

## 2023-09-30 DIAGNOSIS — Z0000 Encounter for general adult medical examination without abnormal findings: Secondary | ICD-10-CM | POA: Diagnosis not present

## 2023-09-30 NOTE — Patient Instructions (Signed)
 Patrick [name] , Thank you for taking time out of your busy schedule to complete your Annual Wellness Visit with me. I enjoyed our conversation and look forward to speaking with you again next year. I, as well as your care team,  appreciate your ongoing commitment to your health goals. Please review the following plan we discussed and let me know if I can assist you in the future. Your Game plan/ To Do List    Referrals: If you haven't heard from the office you've been referred to, please reach out to them at the phone provided.   Follow up Visits: Next Medicare AWV with our clinical staff: 09/30/2024   Have you seen your provider in the last 6 months (3 months if uncontrolled diabetes)? Yes Next Office Visit with your provider: 10/12/2024  Clinician Recommendations:  Aim for 30 minutes of exercise or brisk walking, 6-8 glasses of water, and 5 servings of fruits and vegetables each day.       This is a list of the screening recommended for you and due dates:  Health Maintenance  Topic Date Due   COVID-19 Vaccine (4 - 2024-25 season) 01/12/2023   DTaP/Tdap/Td vaccine (3 - Tdap) 08/03/2023   Flu Shot  12/12/2023   Medicare Annual Wellness Visit  09/29/2024   Pneumonia Vaccine  Completed   Zoster (Shingles) Vaccine  Completed   HPV Vaccine  Aged Out   Meningitis B Vaccine  Aged Out    Advanced directives: (Copy Requested) Please bring a copy of your health care power of attorney and living will to the office to be added to your chart at your convenience. You can mail to [facility] [address] [place], [place] [address] or email to [email or web address] Advance Care Planning is important because it:  [x]  Makes sure you receive the medical care that is consistent with your values, goals, and preferences  [x]  It provides guidance to your family and loved ones and reduces their decisional burden about whether or not they are making the right decisions based on your  wishes.  Follow the link provided in your after visit summary or read over the paperwork we have mailed to you to help you started getting your Advance Directives in place. If you need assistance in completing these, please reach out to us  so that we can help you!  See attachments for Preventive Care and Fall Prevention Tips.

## 2023-09-30 NOTE — Progress Notes (Signed)
 Because this visit was a virtual/telehealth visit,  certain criteria was not obtained, such a blood pressure, CBG if applicable, and timed get up and go. Any medications not marked as "taking" were not mentioned during the medication reconciliation part of the visit. Any vitals not documented were not able to be obtained due to this being a telehealth visit or patient was unable to self-report a recent blood pressure reading due to a lack of equipment at home via telehealth. Vitals that have been documented are verbally provided by the patient.   This visit was performed by a medical professional under my direct supervision. I was immediately available for consultation/collaboration. I have reviewed and agree with the Annual Wellness Visit documentation.   This visit was performed by a medical professional under my direct supervision. I was immediately available for consultation/collaboration. I have reviewed and agree with the Annual Wellness Visit documentation.  Subjective:   Patrick [name] is a 83 y.o. who presents for a Medicare Wellness preventive visit.  As a reminder, Annual Wellness Visits don't include a physical exam, and some assessments may be limited, especially if this visit is performed virtually. We may recommend an in-person follow-up visit with your provider if needed.  Visit Complete: Virtual I connected with  Patrick [name] on 09/30/23 by a audio enabled telemedicine application and verified that I am speaking with the correct person using two identifiers.  Patient Location: Home  Provider Location: Home Office  I discussed the limitations of evaluation and management by telemedicine. The patient expressed understanding and agreed to proceed.  Vital Signs: Because this visit was a virtual/telehealth visit, some criteria may be missing or patient reported. Any vitals not documented were not able to be obtained and vitals that have been documented are patient  reported.  VideoDeclined- This patient declined Librarian, academic. Therefore the visit was completed with audio only.  Persons Participating in Visit: Patient.  AWV Questionnaire: No: Patient Medicare AWV questionnaire was not completed prior to this visit.  Cardiac Risk Factors include: advanced age (>25men, >42 women);male gender;dyslipidemia;hypertension     Objective:     Today's Vitals   09/30/23 0814  BP: 125/62  Weight: 176 lb (79.8 kg)  Height: 5\' 7"  (1.702 m)   Body mass index is 27.57 kg/m.     09/30/2023    8:21 AM 02/03/2022   12:40 PM 11/21/2020    8:08 PM 10/14/2018    9:00 PM 10/14/2018   10:20 AM 03/18/2017    8:32 AM 05/20/2016    9:16 AM  Advanced Directives  Does Patient Have a Medical Advance Directive? Yes No;Yes No No No Yes Yes  Type of Estate agent of [place];Living will Healthcare Power of [place];Living will    Healthcare Power of [place];Living will Healthcare Power of [place];Living will  Does patient want to make changes to medical advance directive? No - Patient declined        Copy of Healthcare Power of Attorney in Chart? No - copy requested     No - copy requested No - copy requested  Would patient like information on creating a medical advance directive?   No - Patient declined No - Patient declined       Current Medications (verified) Outpatient Encounter Medications as of 09/30/2023  Medication Sig   amLODipine  (NORVASC ) 5 MG tablet TAKE 1 TABLET BY MOUTH EVERY DAY   cetirizine (ZYRTEC) 10 MG tablet Take 10 mg by mouth daily as needed  for allergies.   dorzolamide (TRUSOPT) 2 % ophthalmic solution Place 1 drop into both eyes 2 (two) times daily.   ezetimibe  (ZETIA ) 10 MG tablet Take 1 tablet (10 mg total) by mouth daily.   fluocinonide (LIDEX) 0.05 % external solution Apply 1 Application topically 2 (two) times daily.   ketoconazole  (NIZORAL ) 2 % shampoo Apply 1 Application topically 2 (two)  times a week.   latanoprost  (XALATAN ) 0.005 % ophthalmic solution Place 1 drop into both eyes at bedtime.    olmesartan -hydrochlorothiazide  (BENICAR  HCT) 40-25 MG tablet Take 1 tablet by mouth daily.   rosuvastatin  (CRESTOR ) 20 MG tablet TAKE 0.5 TABLETS (10 MG TOTAL) BY MOUTH DAILY. PT NEEDS TO KEEP UPCOMING APPT IN OCT FOR FURTHER REFILLS   No facility-administered encounter medications on file as of 09/30/2023.    Allergies (verified) Atorvastatin, Ezetimibe -simvastatin, Lisinopril , and Pravastatin    History: Past Medical History:  Diagnosis Date   Allergy    Aortic root dilatation (HCC)    Mild by echo 11/2011 (39mm)   BPH (benign prostatic hypertrophy)    Bradycardia    Precluding BB use.   CAD (coronary artery disease)    a. s/p Taxus DES to RCA and PDA in 2006 .  // b. Myoview  8/11: Walked 12:37. EF 57%. no ischemia or scar.  // c. Mod CAD by cath 11/2011 (40-50% ISR of PDA -- for OP myoview  to assess).  //  d. Myoview  3/18: EF 58, no ischemia; Low Risk   Colon polyps    Complication of anesthesia    "they said they about lost, me when I had eye surgery"   Diverticulitis    GERD (gastroesophageal reflux disease)    Glaucoma    Hyperlipidemia    Hypertension    Impaired fasting glucose    Sleep apnea    Syncope    Past Surgical History:  Procedure Laterality Date   ANGIOPLASTY     percutaneous transluminal coronary  angioplasty and stenting of the right coronary artery and posterior    CATARACT EXTRACTION W/ INTRAOCULAR LENS  IMPLANT, BILATERAL  9/15   COLONOSCOPY     INTRAOCULAR LENS INSERTION     LEFT HEART CATH AND CORONARY ANGIOGRAPHY N/A 10/14/2018   Procedure: LEFT HEART CATH AND CORONARY ANGIOGRAPHY;  Surgeon: Swaziland, Peter M, MD;  Location: [facility] INVASIVE CV LAB;  Service: Cardiovascular;  Laterality: N/A;   LEFT HEART CATHETERIZATION WITH CORONARY ANGIOGRAM N/A 11/26/2011   Procedure: LEFT HEART CATHETERIZATION WITH CORONARY ANGIOGRAM;  Surgeon: Lake Pilgrim, MD;   Location: [facility] CATH LAB;  Service: Cardiovascular;  Laterality: N/A;   LUMBAR LAMINECTOMY/DECOMPRESSION MICRODISCECTOMY Left 05/27/2016   Procedure: Laminectomy for facet/synovial cyst - Lumbar four - Lumbar five - left;  Surgeon: Gearl Keens, MD;  Location: [facility] OR;  Service: Neurosurgery;  Laterality: Left;  Laminectomy for facet/synovial cyst - Lumbar four - Lumbar five - left   PILONIDAL CYST / SINUS EXCISION     Family History  Problem Relation Age of Onset   Coronary artery disease Father    Heart disease Father    Heart failure Father    Cancer Mother        ovarian cancer   Stroke Son    Hypertension Neg Hx    Diabetes Neg Hx    Social History   Socioeconomic History   Marital status: Divorced    Spouse name: Not on file   Number of children: 3   Years of education: Not on file  Highest education level: Not on file  Occupational History   Occupation: driver part time    Employer: retired  Tobacco Use   Smoking status: Former    Current packs/day: 0.00    Types: Cigarettes    Start date: 05/13/1948    Quit date: 05/13/1978    Years since quitting: 45.4    Passive exposure: Past   Smokeless tobacco: Never  Vaping Use   Vaping status: Never Used  Substance and Sexual Activity   Alcohol use: Yes    Alcohol/week: 14.0 standard drinks of alcohol    Types: 14 Glasses of wine per week   Drug use: No   Sexual activity: Yes  Other Topics Concern   Not on file  Social History Narrative   Has living will   Daughter Nellie Banas should make medical decisions if he can't   Would accept resuscitation   Wouldn't want prolonged tube feeds if cognitively unaware   Social Drivers of Health   Financial Resource Strain: Low Risk  (09/30/2023)   Overall Financial Resource Strain (CARDIA)    Difficulty of Paying Living Expenses: Not hard at all  Food Insecurity: No Food Insecurity (09/30/2023)   Hunger Vital Sign    Worried About Running Out of Food in the Last Year: Never true    Ran  Out of Food in the Last Year: Never true  Transportation Needs: No Transportation Needs (09/30/2023)   PRAPARE - Administrator, Civil Service (Medical): No    Lack of Transportation (Non-Medical): No  Physical Activity: Sufficiently Active (09/30/2023)   Exercise Vital Sign    Days of Exercise per Week: 3 days    Minutes of Exercise per Session: 60 min  Stress: No Stress Concern Present (09/30/2023)   [employer] of Occupational Health - Occupational Stress Questionnaire    Feeling of Stress : Not at all  Social Connections: Socially Isolated (09/30/2023)   Social Connection and Isolation Panel [NHANES]    Frequency of Communication with Friends and Family: Three times a week    Frequency of Social Gatherings with Friends and Family: Three times a week    Attends Religious Services: Never    Active Member of Clubs or Organizations: No    Attends Banker Meetings: Never    Marital Status: Divorced    Tobacco Counseling Counseling given: Not Answered    Clinical Intake:  Pre-visit preparation completed: Yes  Pain : No/denies pain     BMI - recorded: 27.57 Nutritional Status: BMI 25 -29 Overweight Nutritional Risks: None Diabetes: No  Lab Results  Component Value Date   HGBA1C 6.1 09/01/2020   HGBA1C 5.9 12/08/2009   HGBA1C 5.8 10/19/2007     How often do you need to have someone help you when you read instructions, pamphlets, or other written materials from your doctor or pharmacy?: 1 - Never What is the last grade level you completed in school?: 12th grade  Interpreter Needed?: No  Information entered by :: Havard Radigan,cma   Activities of Daily Living     09/30/2023    8:20 AM  In your present state of health, do you have any difficulty performing the following activities:  Hearing? 0  Vision? 0  Difficulty concentrating or making decisions? 0  Walking or climbing stairs? 0  Dressing or bathing? 0  Doing errands, shopping?  0  Preparing Food and eating ? N  Using the Toilet? N  In the past six months, have you accidently  leaked urine? Y  Do you have problems with loss of bowel control? Y  Managing your Medications? N  Managing your Finances? N  Housekeeping or managing your Housekeeping? N    Patient Care Team: Helaine Llanos, MD as PCP - General Nahser, Lela Purple, MD as PCP - Cardiology (Cardiology) Ardeen Kohler, MD as PCP - Electrophysiology (Cardiology) Gearl Keens, MD as Consulting Physician (Neurosurgery) Ozell Blunt, MD as Consulting Physician (Gastroenterology) Ranny Bye, DDS as Referring Physician (Dentistry) Charlie Constant, OD as Referring Physician (Optometry)  Indicate any recent Medical Services you may have received from other than [facility] providers in the past year (date may be approximate).     Assessment:    This is a routine wellness examination for Dannis.  Hearing/Vision screen Hearing Screening - Comments:: No hearing difficulties Vision Screening - Comments:: Patient wears glasses    Goals Addressed             This Visit's Progress    Patient Stated       To continue working        Depression Screen     09/30/2023    8:22 AM 09/25/2022    9:51 AM 09/25/2022    9:26 AM 09/11/2021    9:41 AM 09/01/2020   10:17 AM 07/01/2018   12:25 PM 03/18/2017    8:29 AM  PHQ 2/9 Scores  PHQ - 2 Score 0 0 0 0 0 0 0  PHQ- 9 Score 0  0    0    Fall Risk     09/30/2023    8:21 AM 09/25/2022    9:51 AM 09/25/2022    9:26 AM 09/11/2021    9:41 AM 09/01/2020   10:17 AM  Fall Risk   Falls in the past year? 0 0 0 0 0  Number falls in past yr: 0  0    Injury with Fall? 0  0    Risk for fall due to : No Fall Risks      Follow up Falls prevention discussed;Falls evaluation completed        MEDICARE RISK AT HOME:  Medicare Risk at Home Any stairs in or around the home?: Yes If so, are there any without handrails?: No Home free of loose throw rugs in walkways, pet beds,  electrical cords, etc?: Yes Adequate lighting in your home to reduce risk of falls?: Yes Life alert?: No Use of a cane, walker or w/c?: No Grab bars in the bathroom?: No Shower chair or bench in shower?: No Elevated toilet seat or a handicapped toilet?: No  TIMED UP AND GO:  Was the test performed?  No  Cognitive Function: 6CIT completed    03/18/2017    8:32 AM 08/28/2015    9:40 AM  MMSE - Mini Mental State Exam  Orientation to time 5 5  Orientation to Place 5 5  Registration 3 3  Attention/ Calculation 0 0  Recall 3 3  Language- name 2 objects 0 0  Language- repeat 1 1  Language- follow 3 step command 3 3  Language- read & follow direction 0 0  Write a sentence 0 0  Copy design 0 0  Total score 20 20        09/30/2023    8:17 AM  6CIT Screen  What Year? 0 points  What month? 0 points  What time? 0 points  Count back from 20 0 points  Months in reverse 2 points  Repeat phrase  6 points  Total Score 8 points    Immunizations Immunization History  Administered Date(s) Administered   Fluad Quad(high Dose 65+) 01/28/2019, 04/05/2020, 03/15/2021   Influenza Split 01/20/2011   Influenza,inj,Quad PF,6+ Mos 03/01/2014, 02/27/2016   Influenza-Unspecified 02/07/2013, 02/08/2016, 02/10/2017, 01/28/2019   PFIZER(Purple Top)SARS-COV-2 Vaccination 08/19/2019, 09/15/2019, 06/07/2020   Pneumococcal Conjugate-13 08/02/2013   Pneumococcal Polysaccharide-23 10/28/2008   Td 06/05/2002, 08/02/2013   Zoster Recombinant(Shingrix) 01/28/2019, 04/29/2019   Zoster, Live 12/28/2010    Screening Tests Health Maintenance  Topic Date Due   COVID-19 Vaccine (4 - 2024-25 season) 01/12/2023   DTaP/Tdap/Td (3 - Tdap) 08/03/2023   INFLUENZA VACCINE  12/12/2023   Medicare Annual Wellness (AWV)  09/29/2024   Pneumonia Vaccine 70+ Years old  Completed   Zoster Vaccines- Shingrix  Completed   HPV VACCINES  Aged Out   Meningococcal B Vaccine  Aged Out    Health Maintenance  Health  Maintenance Due  Topic Date Due   COVID-19 Vaccine (4 - 2024-25 season) 01/12/2023   DTaP/Tdap/Td (3 - Tdap) 08/03/2023   Health Maintenance Items Addressed:   Additional Screening:  Vision Screening: Recommended annual ophthalmology exams for early detection of glaucoma and other disorders of the eye.  Dental Screening: Recommended annual dental exams for proper oral hygiene  Community Resource Referral / Chronic Care Management: CRR required this visit?  No   CCM required this visit?  No   Plan:    I have personally reviewed and noted the following in the patient's chart:   Medical and social history Use of alcohol, tobacco or illicit drugs  Current medications and supplements including opioid prescriptions. Patient is not currently taking opioid prescriptions. Functional ability and status Nutritional status Physical activity Advanced directives List of other physicians Hospitalizations, surgeries, and ER visits in previous 12 months Vitals Screenings to include cognitive, depression, and falls Referrals and appointments  In addition, I have reviewed and discussed with patient certain preventive protocols, quality metrics, and best practice recommendations. A written personalized care plan for preventive services as well as general preventive health recommendations were provided to patient.   Freeda Jerry, [place]   09/30/2023   After Visit Summary: (MyChart) Due to this being a telephonic visit, the after visit summary with patients personalized plan was offered to patient via MyChart   Notes: Nothing significant to report at this time.

## 2023-10-13 ENCOUNTER — Ambulatory Visit: Payer: Self-pay | Admitting: Internal Medicine

## 2023-10-13 ENCOUNTER — Encounter: Payer: Self-pay | Admitting: Internal Medicine

## 2023-10-13 ENCOUNTER — Ambulatory Visit (INDEPENDENT_AMBULATORY_CARE_PROVIDER_SITE_OTHER): Payer: Medicare Other | Admitting: Internal Medicine

## 2023-10-13 VITALS — BP 132/66 | HR 55 | Temp 98.0°F | Ht 67.25 in | Wt 174.0 lb

## 2023-10-13 DIAGNOSIS — E785 Hyperlipidemia, unspecified: Secondary | ICD-10-CM | POA: Diagnosis not present

## 2023-10-13 DIAGNOSIS — I2510 Atherosclerotic heart disease of native coronary artery without angina pectoris: Secondary | ICD-10-CM

## 2023-10-13 DIAGNOSIS — Z0000 Encounter for general adult medical examination without abnormal findings: Secondary | ICD-10-CM

## 2023-10-13 DIAGNOSIS — I10 Essential (primary) hypertension: Secondary | ICD-10-CM

## 2023-10-13 DIAGNOSIS — I77810 Thoracic aortic ectasia: Secondary | ICD-10-CM | POA: Diagnosis not present

## 2023-10-13 DIAGNOSIS — N401 Enlarged prostate with lower urinary tract symptoms: Secondary | ICD-10-CM

## 2023-10-13 LAB — LIPID PANEL
Cholesterol: 152 mg/dL (ref 0–200)
HDL: 47.8 mg/dL (ref >39.00)
LDL Cholesterol: 78 mg/dL (ref 0–99)
NonHDL: 103.86
Total CHOL/HDL Ratio: 3
Triglycerides: 130 mg/dL (ref 0.0–149.0)
VLDL: 26 mg/dL (ref 0.0–40.0)

## 2023-10-13 MED ORDER — TAMSULOSIN HCL 0.4 MG PO CAPS: 0.4000 mg | ORAL_CAPSULE | Freq: Every day | ORAL | 3 refills | Status: DC

## 2023-10-13 NOTE — Assessment & Plan Note (Signed)
 Just on rosuvastatin  Didn't start the zetia 

## 2023-10-13 NOTE — Patient Instructions (Signed)
 Please get the RSV and tetanus vaccines soon. Then get flu and COVID boosters in the fall

## 2023-10-13 NOTE — Assessment & Plan Note (Signed)
 Healthy Discussed exercise No cancer screening due to age Td and RSV soon Flu/COVID boosters in the fall

## 2023-10-13 NOTE — Progress Notes (Signed)
 Subjective:    Patient ID: Patrick [name], male    DOB: [birth year], 83 y.o.   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 Flow is okay--empties okay in day, but not so well at night  Cholesterol and blood pressure up with cardiologist Has improved his eating--so decided not to start the zetia  Weight down a few pounds recently Exercise is not as consistent due to working more--still walks and does push mowing  No chest pain or SOB Known mild aortic root dilatation  Current Outpatient Medications on File Prior to Visit  Medication Sig Dispense Refill   amLODipine  (NORVASC ) 5 MG tablet TAKE 1 TABLET BY MOUTH EVERY DAY 90 tablet 3   cetirizine (ZYRTEC) 10 MG tablet Take 10 mg by mouth daily as needed for allergies.     dorzolamide (TRUSOPT) 2 % ophthalmic solution Place 1 drop into both eyes 2 (two) times daily.     fluocinonide (LIDEX) 0.05 % external solution Apply 1 Application topically 2 (two) times daily.     ketoconazole  (NIZORAL ) 2 % shampoo Apply 1 Application topically 2 (two) times a week. 120 mL 5   latanoprost  (XALATAN ) 0.005 % ophthalmic solution Place 1 drop into both eyes at bedtime.      olmesartan -hydrochlorothiazide  (BENICAR  HCT) 40-25 MG tablet Take 1 tablet by mouth daily. 90 tablet 3   rosuvastatin  (CRESTOR ) 20 MG tablet TAKE 0.5 TABLETS (10 MG TOTAL) BY MOUTH DAILY. PT NEEDS TO KEEP UPCOMING APPT IN OCT FOR FURTHER REFILLS 45 tablet 2   ezetimibe  (ZETIA ) 10 MG tablet Take 1 tablet (10 mg total) by mouth daily. (Patient not taking: Reported on 10/13/2023) 90 tablet 3   No current facility-administered medications on file prior to visit.    Allergies  Allergen Reactions   Atorvastatin Other (See Comments)    MYALGIAS, LEG CRAMPING   Ezetimibe -Simvastatin Other (See Comments)    MYALGIAS, CRAMPING   Lisinopril  Cough    Pravastatin  Other (See Comments)    Myalgias at 40mg  dosage    Past Medical History:  Diagnosis Date   Allergy    Aortic root dilatation (HCC)    Mild by echo 11/2011 (39mm)   BPH (benign prostatic hypertrophy)    Bradycardia    Precluding BB use.   CAD (coronary artery disease)    a. s/p Taxus DES to RCA and PDA in 2006 .  // b. Myoview  8/11: Walked 12:37. EF 57%. no ischemia or scar.  // c. Mod CAD by cath 11/2011 (40-50% ISR of PDA -- for OP myoview  to assess).  //  d. Myoview  3/18: EF 58, no ischemia; Low Risk   Colon polyps    Complication of anesthesia    "they said they about lost, me when I had eye surgery"   Diverticulitis    GERD (gastroesophageal reflux disease)    Glaucoma    Hyperlipidemia    Hypertension    Impaired fasting glucose    Sleep apnea    Syncope     Past Surgical History:  Procedure Laterality Date   ANGIOPLASTY     percutaneous transluminal coronary  angioplasty and stenting of the right coronary artery and posterior    CATARACT EXTRACTION W/ INTRAOCULAR LENS  IMPLANT, BILATERAL  9/15   COLONOSCOPY     INTRAOCULAR LENS INSERTION     LEFT HEART CATH AND CORONARY ANGIOGRAPHY  N/A 10/14/2018   Procedure: LEFT HEART CATH AND CORONARY ANGIOGRAPHY;  Surgeon: Swaziland, Peter M, MD;  Location: [facility] INVASIVE CV LAB;  Service: Cardiovascular;  Laterality: N/A;   LEFT HEART CATHETERIZATION WITH CORONARY ANGIOGRAM N/A 11/26/2011   Procedure: LEFT HEART CATHETERIZATION WITH CORONARY ANGIOGRAM;  Surgeon: Lake Pilgrim, MD;  Location: [facility] CATH LAB;  Service: Cardiovascular;  Laterality: N/A;   LUMBAR LAMINECTOMY/DECOMPRESSION MICRODISCECTOMY Left 05/27/2016   Procedure: Laminectomy for facet/synovial cyst - Lumbar four - Lumbar five - left;  Surgeon: Gearl Keens, MD;  Location: [facility] OR;  Service: Neurosurgery;  Laterality: Left;  Laminectomy for facet/synovial cyst - Lumbar four - Lumbar five - left   PILONIDAL CYST / SINUS EXCISION      Family History  Problem Relation Age  of Onset   Coronary artery disease Father    Heart disease Father    Heart failure Father    Cancer Mother        ovarian cancer   Stroke Son    Hypertension Neg Hx    Diabetes Neg Hx     Social History   Socioeconomic History   Marital status: Divorced    Spouse name: Not on file   Number of children: 3   Years of education: Not on file   Highest education level: Not on file  Occupational History   Occupation: driver part time    Employer: retired  Tobacco Use   Smoking status: Former    Current packs/day: 0.00    Types: Cigarettes    Start date: 05/13/1948    Quit date: 05/13/1978    Years since quitting: 45.4    Passive exposure: Past   Smokeless tobacco: Never  Vaping Use   Vaping status: Never Used  Substance and Sexual Activity   Alcohol use: Yes    Alcohol/week: 14.0 standard drinks of alcohol    Types: 14 Glasses of wine per week   Drug use: No   Sexual activity: Yes  Other Topics Concern   Not on file  Social History Narrative   Has living will   Daughter Nellie Banas should make medical decisions if he can't   Would accept resuscitation   Wouldn't want prolonged tube feeds if cognitively unaware   Social Drivers of Health   Financial Resource Strain: Low Risk  (09/30/2023)   Overall Financial Resource Strain (CARDIA)    Difficulty of Paying Living Expenses: Not hard at all  Food Insecurity: No Food Insecurity (09/30/2023)   Hunger Vital Sign    Worried About Running Out of Food in the Last Year: Never true    Ran Out of Food in the Last Year: Never true  Transportation Needs: No Transportation Needs (09/30/2023)   PRAPARE - Administrator, Civil Service (Medical): No    Lack of Transportation (Non-Medical): No  Physical Activity: Sufficiently Active (09/30/2023)   Exercise Vital Sign    Days of Exercise per Week: 3 days    Minutes of Exercise per Session: 60 min  Stress: No Stress Concern Present (09/30/2023)   [employer] of  Occupational Health - Occupational Stress Questionnaire    Feeling of Stress : Not at all  Social Connections: Socially Isolated (09/30/2023)   Social Connection and Isolation Panel [NHANES]    Frequency of Communication with Friends and Family: Three times a week    Frequency of Social Gatherings with Friends and Family: Three times a week    Attends Religious Services: Never    Active  Member of Clubs or Organizations: No    Attends Banker Meetings: Never    Marital Status: Divorced  Catering manager Violence: Not At Risk (09/30/2023)   Humiliation, Afraid, Rape, and Kick questionnaire    Fear of Current or Ex-Partner: No    Emotionally Abused: No    Physically Abused: No    Sexually Abused: No   Review of Systems  Constitutional:  Negative for fatigue.       Wears seat belt  HENT:  Positive for hearing loss and tinnitus. Negative for dental problem and trouble swallowing.        Keeps up with dentist  Eyes:  Negative for visual disturbance.       No diplopia or unilateral vision loss  Respiratory:  Negative for cough, chest tightness and shortness of breath.   Cardiovascular:  Negative for chest pain, palpitations and leg swelling.  Gastrointestinal:  Negative for blood in stool and constipation.       Rare heartburn--tums will help  Endocrine: Negative for polydipsia and polyuria.  Genitourinary:  Positive for difficulty urinating and frequency.       No sexual concerns---meds didn't help  Musculoskeletal:  Negative for arthralgias, back pain and joint swelling.  Skin:  Negative for rash.       No suspicious lesions  Allergic/Immunologic: Positive for environmental allergies. Negative for immunocompromised state.  Neurological:  Negative for dizziness, syncope, light-headedness and headaches.  Hematological:  Negative for adenopathy. Bruises/bleeds easily.  Psychiatric/Behavioral:  Negative for dysphoric mood and sleep disturbance. The patient is not  nervous/anxious.        Objective:   Physical Exam Constitutional:      Appearance: Normal appearance.  HENT:     Mouth/Throat:     Pharynx: No oropharyngeal exudate or posterior oropharyngeal erythema.  Eyes:     Conjunctiva/sclera: Conjunctivae normal.     Pupils: Pupils are equal, round, and reactive to light.  Cardiovascular:     Rate and Rhythm: Normal rate and regular rhythm.     Pulses: Normal pulses.     Heart sounds: No murmur heard.    No gallop.  Pulmonary:     Effort: Pulmonary effort is normal.     Breath sounds: Normal breath sounds. No wheezing or rales.  Abdominal:     Palpations: Abdomen is soft.     Tenderness: There is no abdominal tenderness.  Musculoskeletal:     Cervical back: Neck supple.     Right lower leg: No edema.     Left lower leg: No edema.  Lymphadenopathy:     Cervical: No cervical adenopathy.  Skin:    Findings: No lesion or rash.  Neurological:     General: No focal deficit present.     Mental Status: He is alert and oriented to person, place, and time.  Psychiatric:        Mood and Affect: Mood normal.        Behavior: Behavior normal.            Assessment & Plan:

## 2023-10-13 NOTE — Assessment & Plan Note (Signed)
 More symptomatic now Will start tamsulosin 0.4mg  daily

## 2023-10-13 NOTE — Assessment & Plan Note (Signed)
 BP Readings from Last 3 Encounters:  10/13/23 132/66  09/30/23 125/62  08/22/23 (!) 150/70   Better with lifestyle changes On heart meds plus amlodipine  5mg  daily

## 2023-10-13 NOTE — Assessment & Plan Note (Signed)
 Borderline on imaging Will leave follow up to cardiology

## 2023-10-13 NOTE — Assessment & Plan Note (Signed)
 Quiet On olmesartan /hydrochlorothiazide  40/25, rosuvastatin  20  Not taking aspirin 

## 2023-12-03 ENCOUNTER — Other Ambulatory Visit: Payer: Self-pay | Admitting: Internal Medicine

## 2023-12-18 DIAGNOSIS — L570 Actinic keratosis: Secondary | ICD-10-CM | POA: Diagnosis not present

## 2023-12-18 DIAGNOSIS — L814 Other melanin hyperpigmentation: Secondary | ICD-10-CM | POA: Diagnosis not present

## 2023-12-18 DIAGNOSIS — L821 Other seborrheic keratosis: Secondary | ICD-10-CM | POA: Diagnosis not present

## 2023-12-18 DIAGNOSIS — D1801 Hemangioma of skin and subcutaneous tissue: Secondary | ICD-10-CM | POA: Diagnosis not present

## 2023-12-18 DIAGNOSIS — D225 Melanocytic nevi of trunk: Secondary | ICD-10-CM | POA: Diagnosis not present

## 2023-12-18 DIAGNOSIS — D692 Other nonthrombocytopenic purpura: Secondary | ICD-10-CM | POA: Diagnosis not present

## 2024-02-25 ENCOUNTER — Encounter: Payer: Self-pay | Admitting: Family Medicine

## 2024-02-25 ENCOUNTER — Ambulatory Visit (INDEPENDENT_AMBULATORY_CARE_PROVIDER_SITE_OTHER): Admitting: Family Medicine

## 2024-02-25 VITALS — BP 126/72 | HR 58 | Temp 97.6°F | Ht 67.25 in | Wt 178.0 lb

## 2024-02-25 DIAGNOSIS — Z23 Encounter for immunization: Secondary | ICD-10-CM

## 2024-02-25 DIAGNOSIS — M722 Plantar fascial fibromatosis: Secondary | ICD-10-CM | POA: Diagnosis not present

## 2024-02-25 NOTE — Assessment & Plan Note (Signed)
 Bilateral but much worse on the left  Pain upon first getting up after sleep or inactivity  Also after too much standing  Tender in heel area/ no swelling or redness  Discussed treatment strategies Hard soled shoe  No barefoot/put shoes by bed  Cold/ice- roll foot over frozen bottle  Voltaren gel over the counter   Update if not starting to improve in next 2 weeks or if worsening  Consider sport med visit if needed  Given handout on plantar fasciitis and also stretches to try   Call back and Er precautions noted in detail today

## 2024-02-25 NOTE — Progress Notes (Signed)
 Subjective:    Patient ID: Patrick [name], male    DOB: Sep 25, 1940, 83 y.o.   MRN: [id number]  HPI  Wt Readings from Last 3 Encounters:  02/25/24 178 lb (80.7 kg)  10/13/23 174 lb (78.9 kg)  09/30/23 176 lb (79.8 kg)   27.67 kg/m  Vitals:   02/25/24 1448  BP: 126/72  Pulse: (!) 58  Temp: 97.6 F (36.4 C)  SpO2: 97%    83 yo pt of Dr Jimmy presents with Left foot and heel pain (right better than left) Flu shot   Pmx noted for HTN, CAD  Takes amlodipine  and olmesartan  hct   For last month or so noted soreness when he gets up in am (bottom of both feet)    Went on mission trip recently  Heavy work / building decks  His left heel started to hurt more   Someone told him to drink black cherry juice Has been off of it this week and improving   No swelling No redness   Not using ice or heat  Took some ibuprofen  Topical product 2% -from someone he knows       Has history of bunion deformity     Patient Active Problem List   Diagnosis Date Noted   Plantar fasciitis 02/25/2024   Bilateral bunions 09/11/2021   CAD (coronary artery disease) 10/15/2018   Coronary artery disease involving native coronary artery of native heart without angina pectoris 04/15/2016   Advance directive discussed with patient 08/08/2014   Dilated aortic root 12/11/2011   Routine general medical examination at a health care facility 12/28/2010   Hyperlipidemia 01/09/2007   Essential hypertension 01/09/2007   Allergic rhinitis 01/09/2007   GERD 01/09/2007   Diverticulosis of colon 01/09/2007   Benign enlargement of prostate 01/09/2007   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   CAD (coronary artery disease)    a. s/p Taxus DES to RCA and PDA in 2006 .  // b. Myoview   8/11: Walked 12:37. EF 57%. no ischemia or scar.  // c. Mod CAD by cath 11/2011 (40-50% ISR of PDA -- for OP myoview  to assess).  //  d. Myoview  3/18: EF 58, no ischemia; Low Risk   Colon polyps    Complication of anesthesia    they said they about lost, me when I had eye surgery   Diverticulitis    GERD (gastroesophageal reflux disease)    Glaucoma    Hyperlipidemia    Hypertension    Impaired fasting glucose    Sleep apnea    Syncope    Past Surgical History:  Procedure Laterality Date   ANGIOPLASTY     percutaneous transluminal coronary  angioplasty and stenting of the right coronary artery and posterior    CATARACT EXTRACTION W/ INTRAOCULAR LENS  IMPLANT, BILATERAL  9/15   COLONOSCOPY     INTRAOCULAR LENS INSERTION     LEFT HEART CATH AND CORONARY ANGIOGRAPHY N/A 10/14/2018   Procedure: LEFT HEART CATH AND CORONARY ANGIOGRAPHY;  Surgeon: Swaziland, Peter M, MD;  Location: [facility] INVASIVE CV LAB;  Service: Cardiovascular;  Laterality: N/A;   LEFT HEART CATHETERIZATION WITH CORONARY ANGIOGRAM N/A 11/26/2011   Procedure: LEFT HEART CATHETERIZATION WITH CORONARY ANGIOGRAM;  Surgeon: Aleene JINNY Passe, MD;  Location: [facility] CATH LAB;  Service: Cardiovascular;  Laterality: N/A;   LUMBAR LAMINECTOMY/DECOMPRESSION MICRODISCECTOMY Left 05/27/2016   Procedure: Laminectomy for facet/synovial cyst - Lumbar four - Lumbar five - left;  Surgeon: Arley Helling, MD;  Location: [facility] OR;  Service: Neurosurgery;  Laterality: Left;  Laminectomy for facet/synovial cyst - Lumbar four - Lumbar five - left   PILONIDAL CYST / SINUS EXCISION     Social History   Tobacco Use   Smoking status: Former    Current packs/day: 0.00    Types: Cigarettes    Start date: 05/13/1948    Quit date: 05/13/1978    Years since quitting: 45.8    Passive exposure: Past   Smokeless tobacco: Never  Vaping Use   Vaping status: Never Used  Substance Use Topics   Alcohol use: Yes    Alcohol/week: 14.0 standard drinks of alcohol    Types: 14  Glasses of wine per week   Drug use: No   Family History  Problem Relation Age of Onset   Coronary artery disease Father    Heart disease Father    Heart failure Father    Cancer Mother        ovarian cancer   Stroke Son    Hypertension Neg Hx    Diabetes Neg Hx    Allergies  Allergen Reactions   Atorvastatin Other (See Comments)    MYALGIAS, LEG CRAMPING   Ezetimibe -Simvastatin Other (See Comments)    MYALGIAS, CRAMPING   Lisinopril  Cough   Pravastatin  Other (See Comments)    Myalgias at 40mg  dosage   Current Outpatient Medications on File Prior to Visit  Medication Sig Dispense Refill   amLODipine  (NORVASC ) 5 MG tablet TAKE 1 TABLET BY MOUTH EVERY DAY 90 tablet 3   cetirizine (ZYRTEC) 10 MG tablet Take 10 mg by mouth daily as needed for allergies.     dorzolamide (TRUSOPT) 2 % ophthalmic solution Place 1 drop into both eyes 2 (two) times daily.     fluocinonide (LIDEX) 0.05 % external solution Apply 1 Application topically 2 (two) times daily.     ketoconazole  (NIZORAL ) 2 % shampoo Apply 1 Application topically 2 (two) times a week. 120 mL 5   latanoprost  (XALATAN ) 0.005 % ophthalmic solution Place 1 drop into both eyes at bedtime.      olmesartan -hydrochlorothiazide  (BENICAR  HCT) 40-25 MG tablet Take 1 tablet by mouth daily. 90 tablet 3   rosuvastatin  (CRESTOR ) 20 MG tablet Take 0.5 tablets (10 mg total) by mouth daily. 45 tablet 3   No current facility-administered medications on file prior to visit.    Review of Systems  Cardiovascular:  Negative for leg swelling.  Musculoskeletal:  Positive for arthralgias.       Bilateral foot pain   Skin:  Negative for color change, pallor, rash and wound.       Objective:   Physical Exam Constitutional:      Appearance: Normal appearance. He is normal weight. He is not ill-appearing or diaphoretic.  Cardiovascular:     Rate and Rhythm: Regular rhythm. Bradycardia present.  Pulmonary:     Effort: Pulmonary effort is  normal. No respiratory distress.  Musculoskeletal:     Right lower leg: No edema.     Left lower leg: No edema.     Right foot: Normal range of motion and normal capillary refill. Bunion present. No swelling, deformity or tenderness. Normal pulse.     Left foot: Normal range  of motion and normal capillary refill. Bunion and tenderness present. No swelling or deformity. Normal pulse.     Comments: Tender over left heel (calcaneus and soft tissue)  No erythema or warmth or swelling   Arches are relatively high   Bunions bilateral /medial     Skin:    General: Skin is warm and dry.     Coloration: Skin is not pale.     Findings: No erythema or rash.  Neurological:     Mental Status: He is alert.     Sensory: No sensory deficit.     Motor: No weakness.           Assessment & Plan:   Problem List Items Addressed This Visit       Musculoskeletal and Integument   Plantar fasciitis - Primary   Bilateral but much worse on the left  Pain upon first getting up after sleep or inactivity  Also after too much standing  Tender in heel area/ no swelling or redness  Discussed treatment strategies Hard soled shoe  No barefoot/put shoes by bed  Cold/ice- roll foot over frozen bottle  Voltaren gel over the counter   Update if not starting to improve in next 2 weeks or if worsening  Consider sport med visit if needed  Given handout on plantar fasciitis and also stretches to try   Call back and Er precautions noted in detail today

## 2024-02-25 NOTE — Patient Instructions (Signed)
 Wear a supportive harder sole shoe  Don't go barefoot if you can help it   Even put shoes by your bed   Freeze a partially full water bottle and roll your foot over it for 10 minutes at a time Do this any time you get a chance  Voltaren gel (diclofenac) -up to four times daily is helpful  Over the counter    Give this a few weeks  Update us  if not improved or worse

## 2024-02-27 DIAGNOSIS — H401132 Primary open-angle glaucoma, bilateral, moderate stage: Secondary | ICD-10-CM | POA: Diagnosis not present

## 2024-02-27 DIAGNOSIS — H04123 Dry eye syndrome of bilateral lacrimal glands: Secondary | ICD-10-CM | POA: Diagnosis not present

## 2024-05-03 ENCOUNTER — Ambulatory Visit: Payer: Self-pay

## 2024-05-03 NOTE — Telephone Encounter (Signed)
 FYI Only or Action Required?: FYI only for provider: appointment scheduled on 12.24.25 scheduled next available at [facility].  Patient was last seen in primary care on 02/25/2024 by Randeen Laine LABOR, MD.  Called Nurse Triage reporting Dizziness and Pain.  Symptoms began several weeks ago.  Interventions attempted: Nothing.  Symptoms are: unchanged.  Triage Disposition: See Physician Within 24 Hours  Patient/caregiver understands and will follow disposition?: Yes  RN Gave dtr strict instructions for call back or ER. She states understanding and states that he is stubborn and doesn't know how to slow down and probably works more than 2 people combined.    Copied from CRM #[id number]. Topic: Clinical - Red Word Triage >> May 03, 2024  1:48 PM Shereese L wrote: [facility] that prompted transfer to Nurse Triage: Patient has plantar fasciitis with severe pain in feet Reason for Disposition  Taking a medicine that could cause dizziness (e.g., blood pressure medications, diuretics)  Answer Assessment - Initial Assessment Questions Pt does have transfer of care appt but dtr states there has been some things she'd like to have addressed before then. Pt is having increased foot pain (bilaterally) from his plantar fascitits. She states also he cardiologist retired and he needs a follow up. She states he is having increased dizziness. He last passed out a year ago but fell last week and felt like he was going to fall but sat down. She states he can't walk in a straight line and leans to the right when walking. She denies him hitting his head. She states he denies any chest pain but she thinks he is having some uncomfortableness. They recently got him  a life alert as he was getting nervous about the potential falls and lives alone. She states he is also having some cognitive changes which are new for him, confusion and forgetfulness. She denies any higher acuity symptoms. Pt scheduled for appt.  Dtr states  he works more than 2 people combined, doesn't know how to slow down. She states he is drinking plenty of fluids    1. DESCRIPTION: Describe your dizziness.     Feels like going to pass out, unsteady on feet.  2. LIGHTHEADED: Do you feel lightheaded? (e.g., somewhat faint, woozy, weak upon standing)     yes 3. VERTIGO: Do you feel like either you or the room is spinning or tilting? (i.e., vertigo)     Dtr denies 4. SEVERITY: How bad is it?  Do you feel like you are going to faint? Can you stand and walk?     Can walk, unsteady, has fallen  5. ONSET:  When did the dizziness begin?     ongoing 6. AGGRAVATING FACTORS: Does anything make it worse? (e.g., standing, change in head position)     Standing up 7. HEART RATE: Can you tell me your heart rate? How many beats in 15 seconds?  (Note: Not all patients can do this.)       When the last episode happened hr was 70, he is typically in the 50's per dtr.  8. CAUSE: What do you think is causing the dizziness? (e.g., decreased fluids or food, diarrhea, emotional distress, heat exposure, new medicine, sudden standing, vomiting; unknown)     unknown 9. RECURRENT SYMPTOM: Have you had dizziness before? If Yes, ask: When was the last time? What happened that time?     yes 10. OTHER SYMPTOMS: Do you have any other symptoms? (e.g., fever, chest pain, vomiting, diarrhea, bleeding)  Dtr denies  Protocols used: Dizziness - Lightheadedness-A-AH

## 2024-05-04 NOTE — Progress Notes (Addendum)
 "    Patrick Wellborn T. Jaasia Viglione, MD, CAQ Sports Medicine [facility] at [facility] [address] [place], [address]  Phone: [phone]  FAX: [phone]  Patrick [name] - 83 y.o. male  MRN [id number]  Date of Birth: 11-22-1940  Date: 05/05/2024  PCP: Jimmy Charlie FERNS, MD  Referral: Jimmy Charlie FERNS, MD  Chief Complaint  Patient presents with   Foot Pain    Bilateral but is Left is worse   Near Syncope   Subjective:   Patrick [name] is a 83 y.o. very pleasant male patient with Body mass index is 28.37 kg/m. who presents with the following:  Discussed the use of AI scribe software for clinical note transcription with the patient, who gave verbal consent to proceed.  This is a patient of Dr. Marval that I have never met before.  She last saw Dr. Randeen on February 25, 2024 for plantar fasciitis.  Nursing notes were reviewed.  Ongoing bilateral plantar fasciitis.  Increased dizziness.  Syncope 1 year ago.  Fell last week.  Cardiologist is Dr. Alveta.  This has been evaluated by cardiology previously.  Cardiac monitor 1 year ago. No arrhythmia, rare PVC, rare PAC.  Family with concern for potential cognitive changes.  Increased confusion and forgetfulness.  CAD, s/p PCI x 2 several years ago  Family has concerns about walking.  This was previously discussed with his primary care doctor more than a year ago.  Walking not straight Ataxia  H/o syncope with near syncope Heel to toe walking History of Present Illness Patrick [name] is an 83 year old male with coronary artery disease who presents with worsening balance issues and near syncope.  He has experienced worsening balance issues for more than a year that has been progressively getting worse.  Describing himself as 'wobbly' and unable to walk straight. He often reaches out to touch objects for stability but has not fallen. These issues are particularly noticeable when walking in public places, such as  taking collections at church or walking in the park.  His family has also noticed that this is worsened.  A year ago, he experienced a syncopal episode while in [place], where he suddenly collapsed but quickly regained consciousness. Recently, he has had near-syncopal episodes, feeling as though he might pass out while visiting a friend and while standing in front of a fireplace. During these episodes, he noted his heart rate was 73, which he states is higher than his usual rate in the 50s. No complete loss of consciousness has occurred recently. - He has already seen cardiology about this, and they did do a Holter monitor about 1 year ago.  He has a history of coronary artery disease with two stents placed in 2008 and underwent a heart catheterization in 2020, which showed diffuse heart disease with blockages ranging from 10% to 50% in all major arteries. He is currently on blood pressure medication and has been consistent with his medication regimen. No history of strokes or heart attacks.  He also reports plantar fasciitis, which flares up with prolonged standing or walking, particularly after being on his feet all day during a mission trip. He uses various aids such as insoles, a massage ball, and a foot massager to manage the pain.  He is active in his church, taking collections and participating in mission trips. His daughter has had two brain tumors, one of which was very rare.  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   CAD (coronary artery disease)    a. s/p Taxus DES to RCA and PDA in 2006 .  // b. Myoview  8/11: Walked 12:37. EF 57%. no ischemia or scar.  // c. Mod CAD by cath 11/2011 (40-50% ISR of PDA -- for OP myoview  to assess).  //  d. Myoview  3/18: EF 58, no ischemia; Low Risk   Colon polyps    Diverticulitis    GERD (gastroesophageal reflux disease)    Glaucoma    Hyperlipidemia    Hypertension    Sleep apnea     Past Surgical History:  Procedure Laterality Date   ANGIOPLASTY     percutaneous transluminal coronary  angioplasty and stenting of the right coronary artery and posterior    CATARACT EXTRACTION W/ INTRAOCULAR LENS  IMPLANT, BILATERAL  01/2014   INTRAOCULAR LENS INSERTION     LEFT HEART CATH AND CORONARY ANGIOGRAPHY N/A 10/14/2018   Procedure: LEFT HEART CATH AND CORONARY ANGIOGRAPHY;  Surgeon: Jordan, Peter M, MD;  Location: [facility] INVASIVE CV LAB;  Service: Cardiovascular;  Laterality: N/A;   LEFT HEART CATHETERIZATION WITH CORONARY ANGIOGRAM N/A 11/26/2011   Procedure: LEFT HEART CATHETERIZATION WITH CORONARY ANGIOGRAM;  Surgeon: Aleene JINNY Passe, MD;  Location[facility] LAB;  Service: Cardiovascular;  Laterality: N/A;   LUMBAR LAMINECTOMY/DECOMPRESSION MICRODISCECTOMY Left 05/27/2016   Procedure: Laminectomy for facet/synovial cyst - Lumbar four - Lumbar five - left;  Surgeon: Arley Helling, MD;  Location: [facility] OR;  Service: Neurosurgery;  Laterality: Left;  Laminectomy for facet/synovial cyst - Lumbar four - Lumbar five - left   PILONIDAL CYST / SINUS EXCISION      Family History  Problem Relation Age of Onset   Coronary artery disease Father    Heart  disease Father    Heart failure Father    Cancer Mother        ovarian cancer   Stroke Son    Hypertension Neg Hx    Diabetes Neg Hx     Social History   Social History Narrative   Has living will   Daughter Almarie should make medical decisions if he can't   Would accept resuscitation   Wouldn't want prolonged tube feeds if cognitively unaware     Objective:   BP (!) 140/68   Pulse (!) 57   Temp (!) 97.5 F (36.4 C) (Temporal)   Ht 5' 7.25 (1.708 m)   Wt 182 lb 8 oz (82.8 kg)   SpO2 97%   BMI 28.37 kg/m   GEN: No acute distress; alert,appropriate. CV: RRR, no m/g/r  PULM: Normal respiratory  rate, no accessory muscle use. No wheezes, crackles or rhonchi    Neuro: CN 2-12 grossly intact. PERRLA. EOMI. Sensation intact throughout. Str 5/5 all extremities. DTR 2+. No clonus. A and o x 4.   Romberg is positive  Unable to walk heel-to-toe, notably imbalanced   Finger nose neg.   PSYCH: Normally interactive. Conversant. Not depressed or anxious appearing.  Calm demeanor.    Bilateral tenderness at the plantar fascia insertion at the calcaneus Diffuse arthritic changes in the bilateral feet  Laboratory and Imaging Data:  Assessment and Plan:     ICD-10-CM   1. Postural dizziness with presyncope  R42 Ambulatory referral to Cardiology   R55 Basic metabolic panel with GFR    CBC with Differential/Platelet    Hepatic function panel    TSH    MR Brain W Wo Contrast    Ambulatory referral to Physical Therapy    CANCELED: MR Brain Wo Contrast    2. Dizziness  R42 Ambulatory referral to Cardiology    Basic metabolic panel with GFR    CBC with Differential/Platelet    Hepatic function panel    TSH    MR Brain W Wo Contrast    Ambulatory referral to Physical Therapy    CANCELED: MR Brain Wo Contrast    3. Ataxia  R27.0 Basic metabolic panel with GFR    CBC with Differential/Platelet    Hepatic function panel    TSH    MR Brain W Wo Contrast    Ambulatory  referral to Physical Therapy    CANCELED: MR Brain Wo Contrast    4. Coronary artery disease involving native coronary artery of native heart without angina pectoris  I25.10 Ambulatory referral to Cardiology    Basic metabolic panel with GFR    CBC with Differential/Platelet    Hepatic function panel    TSH    5. Essential hypertension  I10     6. Abnormal neurological exam  R29.90 Basic metabolic panel with GFR    CBC with Differential/Platelet    Hepatic function panel    TSH    MR Brain W Wo Contrast    CANCELED: MR Brain Wo Contrast    7. Weakness  R53.1 Ambulatory referral to Physical Therapy    8. 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 This includes total time spent on the day of encounter.  Long discussion with the patient, his daughter, exam and chart review. Assessment & Plan Gait instability with ataxia and presyncope Chronic gait instability with worsening ataxia and presyncope. Differential includes subacute stroke, brain tumor, or other neurological causes. Concerns about potential stroke due to heart disease and age. - Ordered MRI of the brain with and without contrast.  Rule out acute versus subacute stroke.  We will obtain the MRI with contrast to evaluate for potential neoplasm or demyelinating disease, such as multiple sclerosis. - Referred to cardiology for evaluation of presyncope and potential cardiac causes. - Advised to start 81 mg aspirin  daily.  Coronary artery disease Diffuse heart disease with 10% to 50% stenosis in major arteries. Blood pressure management is crucial to prevent further cardiac events. - Advised to start 81 mg aspirin  daily. - Referred to cardiology for ongoing management.  Dr. Alveta has retired, and he needs to get set up with a primary cardiologist.  Essential hypertension Blood pressure slightly elevated. Current management  includes antihypertensive  medications. - Continue current antihypertensive regimen.  Plantar fasciitis Chronic plantar fasciitis with increased pain during prolonged standing or walking. Symptoms exacerbated by recent physical activity. - Continue using foot massagers and insoles. - Perform daily foot stretches and exercises. - Consider purchasing heel cups for additional support.  Medication Management during today's office visit: Meds ordered this encounter  Medications   ketoconazole  (NIZORAL ) 2 % shampoo    Sig: Apply 1 Application topically 2 (two) times a week.    Dispense:  120 mL    Refill:  5   Medications Discontinued During This Encounter  Medication Reason   ketoconazole  (NIZORAL ) 2 % shampoo Reorder    Orders placed today for conditions managed today: Orders Placed This Encounter  Procedures   MR Brain W Wo Contrast   Basic metabolic panel with GFR   CBC with Differential/Platelet   Hepatic function panel   TSH   Ambulatory referral to Cardiology   Ambulatory referral to Physical Therapy    Disposition: No follow-ups on file.  [employer] One speech-to-text software was used for transcription in this dictation.  Possible transcriptional errors can occur using Animal nutritionist.   Signed,  Jacques DASEN. Gaylin Osoria, MD   Outpatient Encounter Medications as of 05/05/2024  Medication Sig   amLODipine  (NORVASC ) 5 MG tablet TAKE 1 TABLET BY MOUTH EVERY DAY   cetirizine (ZYRTEC) 10 MG tablet Take 10 mg by mouth daily as needed for allergies.   dorzolamide (TRUSOPT) 2 % ophthalmic solution Place 1 drop into both eyes 2 (two) times daily.   fluocinonide (LIDEX) 0.05 % external solution Apply 1 Application topically 2 (two) times daily.   latanoprost  (XALATAN ) 0.005 % ophthalmic solution Place 1 drop into both eyes at bedtime.    olmesartan -hydrochlorothiazide  (BENICAR  HCT) 40-25 MG tablet Take 1 tablet by mouth daily.   rosuvastatin  (CRESTOR ) 20 MG tablet Take 0.5  tablets (10 mg total) by mouth daily.   [DISCONTINUED] ketoconazole  (NIZORAL ) 2 % shampoo Apply 1 Application topically 2 (two) times a week.   ketoconazole  (NIZORAL ) 2 % shampoo Apply 1 Application topically 2 (two) times a week.   No facility-administered encounter medications on file as of 05/05/2024.   "

## 2024-05-05 ENCOUNTER — Encounter: Payer: Self-pay | Admitting: Family Medicine

## 2024-05-05 ENCOUNTER — Ambulatory Visit: Admitting: Family Medicine

## 2024-05-05 VITALS — BP 140/68 | HR 57 | Temp 97.5°F | Ht 67.25 in | Wt 182.5 lb

## 2024-05-05 DIAGNOSIS — R2990 Unspecified symptoms and signs involving the nervous system: Secondary | ICD-10-CM | POA: Diagnosis not present

## 2024-05-05 DIAGNOSIS — R270 Ataxia, unspecified: Secondary | ICD-10-CM

## 2024-05-05 DIAGNOSIS — Z8673 Personal history of transient ischemic attack (TIA), and cerebral infarction without residual deficits: Secondary | ICD-10-CM | POA: Diagnosis not present

## 2024-05-05 DIAGNOSIS — I2510 Atherosclerotic heart disease of native coronary artery without angina pectoris: Secondary | ICD-10-CM | POA: Diagnosis not present

## 2024-05-05 DIAGNOSIS — R55 Syncope and collapse: Secondary | ICD-10-CM

## 2024-05-05 DIAGNOSIS — R42 Dizziness and giddiness: Secondary | ICD-10-CM

## 2024-05-05 DIAGNOSIS — I10 Essential (primary) hypertension: Secondary | ICD-10-CM | POA: Diagnosis not present

## 2024-05-05 DIAGNOSIS — R531 Weakness: Secondary | ICD-10-CM

## 2024-05-05 LAB — CBC WITH DIFFERENTIAL/PLATELET
Basophils Absolute: 0.1 K/uL (ref 0.0–0.1)
Basophils Relative: 0.7 % (ref 0.0–3.0)
Eosinophils Absolute: 0.3 K/uL (ref 0.0–0.7)
Eosinophils Relative: 3.3 % (ref 0.0–5.0)
HCT: 42.6 % (ref 39.0–52.0)
Hemoglobin: 14.4 g/dL (ref 13.0–17.0)
Lymphocytes Relative: 12.7 % (ref 12.0–46.0)
Lymphs Abs: 1.1 K/uL (ref 0.7–4.0)
MCHC: 33.7 g/dL (ref 30.0–36.0)
MCV: 92 fl (ref 78.0–100.0)
Monocytes Absolute: 0.7 K/uL (ref 0.1–1.0)
Monocytes Relative: 8.6 % (ref 3.0–12.0)
Neutro Abs: 6.5 K/uL (ref 1.4–7.7)
Neutrophils Relative %: 74.7 % (ref 43.0–77.0)
Platelets: 287 K/uL (ref 150.0–400.0)
RBC: 4.63 Mil/uL (ref 4.22–5.81)
RDW: 13.6 % (ref 11.5–15.5)
WBC: 8.7 K/uL (ref 4.0–10.5)

## 2024-05-05 LAB — BASIC METABOLIC PANEL WITH GFR
BUN: 31 mg/dL — ABNORMAL HIGH (ref 6–23)
CO2: 27 meq/L (ref 19–32)
Calcium: 9.6 mg/dL (ref 8.4–10.5)
Chloride: 102 meq/L (ref 96–112)
Creatinine, Ser: 1.34 mg/dL (ref 0.40–1.50)
GFR: 49 mL/min — ABNORMAL LOW
Glucose, Bld: 89 mg/dL (ref 70–99)
Potassium: 4.4 meq/L (ref 3.5–5.1)
Sodium: 138 meq/L (ref 135–145)

## 2024-05-05 LAB — HEPATIC FUNCTION PANEL
ALT: 14 U/L (ref 3–53)
AST: 14 U/L (ref 5–37)
Albumin: 4.7 g/dL (ref 3.5–5.2)
Alkaline Phosphatase: 80 U/L (ref 39–117)
Bilirubin, Direct: 0.1 mg/dL (ref 0.1–0.3)
Total Bilirubin: 0.7 mg/dL (ref 0.2–1.2)
Total Protein: 7.2 g/dL (ref 6.0–8.3)

## 2024-05-05 LAB — TSH: TSH: 1.46 u[IU]/mL (ref 0.35–5.50)

## 2024-05-05 MED ORDER — KETOCONAZOLE 2 % EX SHAM: 1.0000 | MEDICATED_SHAMPOO | CUTANEOUS | 5 refills | Status: AC

## 2024-05-05 NOTE — Patient Instructions (Signed)
Please read handouts on Plantar Fascitis.  STRETCHING and Strengthening program critically important.  Strengthening on foot and calf muscles as seen in handout. Calf raises, 2 legged, then 1 legged. Foot massage with tennis ball. Ice massage.  Towel Scrunches: get a towel or hand towel, use toes to pick up and scrunch up the towel.  Marble pick-ups, practice picking up marbles with toes and placing into a cup  NEEDS TO BE DONE EVERY DAY  Recommended over the counter insoles. (Spenco or Hapad)  A rigid shoe with good arch support helps: Dansko (great), Keen, Merrell No easily bendable shoes.   Tuli's heel cups  

## 2024-05-07 ENCOUNTER — Encounter: Payer: Self-pay | Admitting: Family Medicine

## 2024-05-07 ENCOUNTER — Ambulatory Visit: Payer: Self-pay | Admitting: Family Medicine

## 2024-05-18 ENCOUNTER — Ambulatory Visit
Admission: RE | Admit: 2024-05-18 | Discharge: 2024-05-18 | Disposition: A | Discharge status: Home / Self Care | Source: Ambulatory Visit | Attending: Family Medicine | Admitting: Family Medicine

## 2024-05-18 DIAGNOSIS — R270 Ataxia, unspecified: Secondary | ICD-10-CM

## 2024-05-18 DIAGNOSIS — R42 Dizziness and giddiness: Secondary | ICD-10-CM

## 2024-05-18 DIAGNOSIS — R2990 Unspecified symptoms and signs involving the nervous system: Secondary | ICD-10-CM

## 2024-05-18 MED ORDER — GADOPICLENOL 0.5 MMOL/ML IV SOLN
10.0000 mL | Freq: Once | INTRAVENOUS | Status: AC | PRN
  Administered 2024-05-18: 8 mL via INTRAVENOUS

## 2024-05-25 ENCOUNTER — Telehealth: Payer: Self-pay

## 2024-05-25 NOTE — Telephone Encounter (Signed)
 Copied from CRM [id number]. Topic: Clinical - Lab/Test Results >> May 25, 2024  2:26 PM Deaijah H wrote: Reason for CRM: Patient called in requesting to speak with nurse regarding imaging results

## 2024-05-25 NOTE — Telephone Encounter (Signed)
 Spoke to pt. Advised him that the MRI has not been read yet. Explained that there is a radiologist shortage and it can take 2-3 weeks sometimes for imaging to be read.

## 2024-05-27 NOTE — Telephone Encounter (Unsigned)
 Copied from CRM [id number]. Topic: Clinical - Lab/Test Results >> May 25, 2024  2:26 PM Patrick [name] wrote: Reason for CRM: Patient called in requesting to speak with nurse regarding imaging results >> May 27, 2024  3:11 PM Patrick [name] wrote: Pt called to request stat review on his brain mri as it has been causing him anxiety. Please call to update.

## 2024-05-28 MED ORDER — APIXABAN 5 MG PO TABS: 5.0000 mg | ORAL_TABLET | Freq: Two times a day (BID) | ORAL | 3 refills | Status: AC

## 2024-05-28 NOTE — Telephone Encounter (Signed)
 Copied from CRM [id number]. Topic: Clinical - Lab/Test Results >> May 28, 2024  1:01 PM Sophia H wrote: Reason for CRM: Patients daughter was calling in on behalf of the patient, states received a message via my chart that imaging results are available and would like to speak with provider.   Please reach out to either [place] or [place].

## 2024-05-28 NOTE — Telephone Encounter (Signed)
 Can you ask Radiology to read his MRI today?  Thanks.

## 2024-05-28 NOTE — Telephone Encounter (Signed)
 I discussed with the patient on the phone.  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  I have going to have him start taking Eliquis  5 mg p.o. twice daily

## 2024-05-31 NOTE — Telephone Encounter (Signed)
 Multiple messages.  I already responded to the patient's daughter.

## 2024-05-31 NOTE — Addendum Note (Signed)
 Addended by: WATT MIRZA on: 05/31/2024 08:15 AM   Modules accepted: Orders

## 2024-05-31 NOTE — Telephone Encounter (Signed)
 Please see recent MyChart message that Daughter sent about PT

## 2024-05-31 NOTE — Telephone Encounter (Signed)
 There are multiple open messages about this patient and mychart messages - somewhat confusing.  His daughter asked me to refer him to PT, which I have done.  Can you let the patient know.

## 2024-05-31 NOTE — Telephone Encounter (Signed)
 I think the daughter is wanting his MRI results.  They saw they were available on his MyChart.

## 2024-05-31 NOTE — Telephone Encounter (Signed)
*  Thalamic infarct

## 2024-05-31 NOTE — Telephone Encounter (Signed)
 Yes, this is confusing.  At another [facility], she indicated that she had seen them.  The question was about PT - please let them know that he has been referred.

## 2024-06-09 ENCOUNTER — Ambulatory Visit: Attending: Family Medicine

## 2024-06-09 DIAGNOSIS — R42 Dizziness and giddiness: Secondary | ICD-10-CM | POA: Insufficient documentation

## 2024-06-09 DIAGNOSIS — R270 Ataxia, unspecified: Secondary | ICD-10-CM | POA: Insufficient documentation

## 2024-06-09 DIAGNOSIS — Z8673 Personal history of transient ischemic attack (TIA), and cerebral infarction without residual deficits: Secondary | ICD-10-CM | POA: Insufficient documentation

## 2024-06-09 DIAGNOSIS — R55 Syncope and collapse: Secondary | ICD-10-CM | POA: Insufficient documentation

## 2024-06-09 DIAGNOSIS — R531 Weakness: Secondary | ICD-10-CM | POA: Insufficient documentation

## 2024-06-09 DIAGNOSIS — R2681 Unsteadiness on feet: Secondary | ICD-10-CM | POA: Diagnosis present

## 2024-06-09 NOTE — Therapy (Signed)
 " OUTPATIENT PHYSICAL THERAPY VESTIBULAR/NEURO EVALUATION     Patient Name: Patrick [name] MRN: [id number] DOB:1940-09-17, 84 y.o., male Today's Date: 06/09/2024  END OF SESSION:  PT End of Session - 06/09/24 1157     Visit Number 1    Number of Visits 25    Date for Recertification  09/01/24    PT Start Time 0936    PT Stop Time 1015    PT Time Calculation (min) 39 min    Equipment Utilized During Treatment Gait belt    Activity Tolerance Patient tolerated treatment well    Behavior During Therapy [facility] for tasks assessed/performed          Past Medical History:  Diagnosis Date   Aortic root dilatation    Mild by echo 11/2011 (39mm)   BPH (benign prostatic hypertrophy)    Bradycardia    Precluding BB use.   CAD (coronary artery disease)    a. s/p Taxus DES to RCA and PDA in 2006 .  // b. Myoview  8/11: Walked 12:37. EF 57%. no ischemia or scar.  // c. Mod CAD by cath 11/2011 (40-50% ISR of PDA -- for OP myoview  to assess).  //  d. Myoview  3/18: EF 58, no ischemia; Low Risk   Colon polyps    Diverticulitis    GERD (gastroesophageal reflux disease)    Glaucoma    Hyperlipidemia    Hypertension    Sleep apnea    Past Surgical History:  Procedure Laterality Date   ANGIOPLASTY     percutaneous transluminal coronary  angioplasty and stenting of the right coronary artery and posterior    CATARACT EXTRACTION W/ INTRAOCULAR LENS  IMPLANT, BILATERAL  01/2014   INTRAOCULAR LENS INSERTION     LEFT HEART CATH AND CORONARY ANGIOGRAPHY N/A 10/14/2018   Procedure: LEFT HEART CATH AND CORONARY ANGIOGRAPHY;  Surgeon: Jordan, Peter M, MD;  Location: [facility] INVASIVE CV LAB;  Service: Cardiovascular;  Laterality: N/A;   LEFT HEART CATHETERIZATION WITH CORONARY ANGIOGRAM N/A 11/26/2011   Procedure: LEFT HEART CATHETERIZATION WITH CORONARY ANGIOGRAM;  Surgeon: Aleene JINNY Passe, MD;  Location: [facility] CATH LAB;  Service: Cardiovascular;  Laterality: N/A;   LUMBAR LAMINECTOMY/DECOMPRESSION MICRODISCECTOMY  Left 05/27/2016   Procedure: Laminectomy for facet/synovial cyst - Lumbar four - Lumbar five - left;  Surgeon: Arley Helling, MD;  Location: [facility] OR;  Service: Neurosurgery;  Laterality: Left;  Laminectomy for facet/synovial cyst - Lumbar four - Lumbar five - left   PILONIDAL CYST / SINUS EXCISION     Patient Active Problem List   Diagnosis Date Noted   Plantar fasciitis 02/25/2024   CAD involving native coronary artery without angina pectoris 10/15/2018   Advance directive discussed with patient 08/08/2014   Dilated aortic root 12/11/2011   Hyperlipidemia 01/09/2007   Essential hypertension 01/09/2007   Allergic rhinitis 01/09/2007   GERD 01/09/2007   Diverticulosis of colon 01/09/2007   Benign enlargement of prostate 01/09/2007   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   History of colonic polyps 01/09/2007    PCP: Jimmy Charlie FERNS, MD REFERRING PROVIDER: Watt Mirza, MD  REFERRING DIAG:  Diagnosis  [phone] (ICD-10-CM) - Postural dizziness with presyncope  R42 (ICD-10-CM) - Dizziness  R27.0 (ICD-10-CM) - Ataxia  R53.1 (ICD-10-CM) - Weakness  Z86.73 (ICD-10-CM) - History of cerebrovascular accident (CVA) due to ischemia    THERAPY DIAG:  Unsteadiness on feet  ONSET DATE: 2024  Rationale for Evaluation and Treatment: Rehabilitation  SUBJECTIVE:   SUBJECTIVE STATEMENT: The patient is a pleasant  84 y/o male here for imbalance. He reports that his balance began changing approximately two years ago and describes the onset as gradual. He explains that although he does not feel dizzy, he does not walk in a straight line, and his daughter has noticed that he wobbles when walking. He states that within the past year, he traveled to [place] and experienced an episode where he blacked out. Prior to this trip, he wore a heart monitor for one month due to concern for a low heart rate, and then wore an additional monitor, though it didn't show any significant findings. He reports having had an MRI  that indicated an old stroke, though he is unsure of when it occurred. 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 He denies current lightheadedness but states that when walking he often has to furniture surf for stability.  Pt accompanied by:  self  PERTINENT HISTORY:   Per MD note: ... Syncope 1 year ago.  Fell last week.  Cardiologist is Dr. Alveta.  This has been evaluated by cardiology previously.  Cardiac monitor 1 year ago. No arrhythmia, rare PVC, rare PAC.   Family with concern for potential cognitive changes.  Increased confusion and forgetfulness.   CAD, s/p PCI x 2 several years ago   Family has concerns about walking.  This was previously discussed with his primary care doctor more than a year ago.   Walking not straight Ataxia   H/o syncope with near syncope Heel to toe walking  PMH includes plantar faciitis, CAD, essential HTN, sleep apnea please refer to complete list above  PAIN:  Are you having pain? No  PRECAUTIONS: Fall  RED FLAGS: None   WEIGHT BEARING RESTRICTIONS: No  FALLS: Has patient fallen in last 6 months? None currently, one almost-fall a year ago  LIVING ENVIRONMENT: Lives with: lives alone Lives in: House/apartment Stairs: 2 steps no handrails (says short steps) Has following equipment at home: no equipment  PLOF: Independent  PATIENT GOALS: Improving walking balance  OBJECTIVE:  Note: Objective measures were completed at Evaluation unless otherwise noted.  DIAGNOSTIC FINDINGS:   Via chart MR Brain 05/28/24  IMPRESSION: 1. No acute intracranial abnormality or mass. 2. Cerebral volume is within the expected range for the patient's age. 3. 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   Electronically signed by: Ryan Chess MD 05/28/2024 09:52 AM EST RP Workstation: [id number]    COGNITION: Overall cognitive status: Within functional limits for  tasks assessed   SENSATION: Occ numbness in feet says since he has had plantar fasciitis   EDEMA:  None per report   POSTURE:  weight shift right   STRENGTH:   LOWER EXTREMITY MMT:   MMT Right eval Left eval  Hip flexion 5 5  Hip abduction 5 5  Hip adduction 5 5  Hip internal rotation    Hip external rotation    Knee flexion 5 5  Knee extension 5 5  Ankle dorsiflexion 5 5  Ankle plantarflexion    Ankle inversion    Ankle eversion    (Blank rows = not tested)  BED MOBILITY:  Not tested  TRANSFERS: Assistive device utilized: None  Sit to stand: Complete Independence Stand to sit: Complete Independence Chair to chair: Complete Independence  GAIT: Gait pattern: decreased trunk rotation, increased sway (particularly with head turns and to R side), R weight-shift throughout  Distance walked: clinic distances Assistive device utilized:  None Level of assistance: occ touching surfaces to steady self   FUNCTIONAL TESTS:   [facility] PT Assessment - 06/09/24 0001       Standardized Balance Assessment   Standardized Balance Assessment Dynamic Gait Index;Berg Balance Test      Berg Balance Test   Sit to Stand Able to stand without using hands and stabilize independently    Standing Unsupported Able to stand safely 2 minutes    Sitting with Back Unsupported but Feet Supported on Floor or Stool Able to sit safely and securely 2 minutes    Stand to Sit Sits safely with minimal use of hands    Transfers Able to transfer safely, minor use of hands    Standing Unsupported with Eyes Closed Able to stand 10 seconds safely    Standing Unsupported with Feet Together Able to place feet together independently and stand 1 minute safely    From Standing, Reach Forward with Outstretched Arm Can reach confidently >25 cm (10)    From Standing Position, Pick up Object from Floor Able to pick up shoe safely and easily    From Standing Position, Turn to Look Behind Over each Shoulder  Looks behind from both sides and weight shifts well    Turn 360 Degrees Able to turn 360 degrees safely in 4 seconds or less    Standing Unsupported, Alternately Place Feet on Step/Stool Able to stand independently and safely and complete 8 steps in 20 seconds    Standing Unsupported, One Foot in Front Able to plae foot ahead of the other independently and hold 30 seconds    Standing on One Leg Able to lift leg independently and hold equal to or more than 3 seconds   difficulty on LLE   Total Score 53      Dynamic Gait Index   Level Surface Mild Impairment    Change in Gait Speed Normal    Gait with Horizontal Head Turns Mild Impairment    Gait with Vertical Head Turns Mild Impairment    Gait and Pivot Turn Normal    Step Over Obstacle Mild Impairment    Step Around Obstacles Normal    Steps Mild Impairment    Total Score 19          : 1.57 m/s    PATIENT SURVEYS:  ABC scale: deferred to next 1-2 visits due to time    VESTIBULAR ASSESSMENT: deferred  ORTHOSTATICS: to be assessed next 1-2 visits                                                                                                                             TREATMENT DATE: 06/09/24  SELF CARE: Reviewed findings of evaluation, including prognosis, purpose of PT. Provided education on HEP and in-session instruction to confirm understanding.   PATIENT EDUCATION: Education details: see above Person educated: Patient Education method: Explanation, Demonstration, Verbal cues, and Handouts Education comprehension: verbalized understanding and returned demonstration  HOME EXERCISE PROGRAM: Head nod and side-to-side Access Code: [email or web address] URL: [email or web address] Date: 06/09/2024 Prepared by: Darryle Patten  Exercises - Standing Corner Balance With [employer] and Chair in [place]  - 1 x daily - 7 x weekly - 2 sets - 1 reps - 30 seconds hold - Standing Single Leg Stance with Counter Support  - 1 x  daily - 7 x weekly - 2 sets - 1 reps - 30 seconds hold   SHORT TERM GOALS: Target date: 07/21/2024   Patient will be independent in home exercise program to improve balance and mobility for better functional independence with ADLs. Baseline: initiated  Goal status: INITIAL   LONG TERM GOALS: Target date: 09/01/2024   Patient will increase ABC scale score >80% to demonstrate better functional mobility and better confidence with ADLs.  Baseline: to be assessed next 1-2 visits Goal status: INITIAL   2.  Patient will increase Berg Balance score by at least 2 points to demonstrate improved quiet balance during functional activities Baseline: 53/56 Goal status: INITIAL  3.  Patient will increase dynamic gait index score to >21/24 as to demonstrate improved dynamic gait balance for better safety with community/home ambulation.   Baseline: 19/24 Goal status: INITIAL  4. The patient will demonstrate ability to ambulate with horizontal head turns and vertical head turns each over 10 meter track with no greater than 3 deviations from ambulating straight path to indicate increased safety with gait activities.  Baseline: impaired, multiple errors  Goal status: INITIAL     ASSESSMENT:  CLINICAL IMPRESSION: Patient is a pleasant 84 y.o. male who was seen today for physical therapy evaluation and treatment for imbalance. Pt's BERG score of 53/56 indicates low fall risk, however, DGI score of 19/24 reflects moderate gait instability, especially observed with head-turns and gait. Pt's difficulty maintaining a straight path and touching onto furniture to steady self during ambulation further supports impairments in dynamic balance and gait adaptability, contributing to functional limitations in daily and community mobility and likely increasing fall risk. The pt will benefit from further skilled PT to address these impairments in order to improve balance, gait and mobility and safety with activities  and ADLs.  OBJECTIVE IMPAIRMENTS: Abnormal gait, decreased balance, decreased coordination, decreased mobility, difficulty walking, impaired sensation, improper body mechanics, and postural dysfunction.   ACTIVITY LIMITATIONS: stairs and locomotion level  PARTICIPATION LIMITATIONS: cleaning, shopping, community activity, and church  PERSONAL FACTORS: Age, Time since onset of injury/illness/exacerbation, and 3+ comorbidities: PMH includes plantar faciitis, CAD, essential HTN, sleep apnea please refer to complete list above are also affecting patient's functional outcome.   REHAB POTENTIAL: Good  CLINICAL DECISION MAKING: Evolving/moderate complexity  EVALUATION COMPLEXITY: Moderate   PLAN:  PT FREQUENCY: 1-2x/week  PT DURATION: 12 weeks  PLANNED INTERVENTIONS: 97164- PT Re-evaluation, 97750- Physical Performance Testing, 97110-Therapeutic exercises, 97530- Therapeutic activity, 97112- Neuromuscular re-education, 97535- Self Care, [id number]- Manual therapy, [phone]- Gait training, [phone]- Orthotic Initial, [phone]- Canalith repositioning, Patient/Family education, Balance training, Stair training, Joint mobilization, Spinal mobilization, Vestibular training, DME instructions, Cryotherapy, and Moist heat  PLAN FOR NEXT SESSION: orthostatics, oculomotor screen, possible positional screen if time, otherwise balance training Darryle Patten PT, DPT   Darryle JONELLE Patten, PT 06/09/2024, 12:24 PM  "

## 2024-06-15 ENCOUNTER — Ambulatory Visit

## 2024-06-15 VITALS — BP 130/40 | HR 64 | Ht 67.0 in | Wt 183.4 lb

## 2024-06-15 DIAGNOSIS — R55 Syncope and collapse: Secondary | ICD-10-CM

## 2024-06-15 DIAGNOSIS — I10 Essential (primary) hypertension: Secondary | ICD-10-CM | POA: Diagnosis not present

## 2024-06-15 DIAGNOSIS — E782 Mixed hyperlipidemia: Secondary | ICD-10-CM

## 2024-06-15 DIAGNOSIS — R001 Bradycardia, unspecified: Secondary | ICD-10-CM | POA: Diagnosis not present

## 2024-06-15 DIAGNOSIS — I2510 Atherosclerotic heart disease of native coronary artery without angina pectoris: Secondary | ICD-10-CM

## 2024-06-15 NOTE — Progress Notes (Unsigned)
 Enrolled patient for a 14 day Zio XT  monitor to be mailed to patients home

## 2024-06-16 ENCOUNTER — Ambulatory Visit

## 2024-06-16 DIAGNOSIS — R2681 Unsteadiness on feet: Secondary | ICD-10-CM

## 2024-06-16 DIAGNOSIS — R262 Difficulty in walking, not elsewhere classified: Secondary | ICD-10-CM

## 2024-06-16 NOTE — Therapy (Signed)
 " OUTPATIENT PHYSICAL THERAPY NEURO TREATMENT NOTE     Patient Name: Patrick [name] MRN: [id number] DOB:1940/07/31, 84 y.o., male Today's Date: 06/16/2024  END OF SESSION:  PT End of Session - 06/16/24 1017     Visit Number 2    Number of Visits 25    Date for Recertification  09/01/24    PT Start Time 1016    PT Stop Time 1058    PT Time Calculation (min) 42 min    Equipment Utilized During Treatment Gait belt    Activity Tolerance Patient tolerated treatment well    Behavior During Therapy WFL for tasks assessed/performed          Past Medical History:  Diagnosis Date   Aortic root dilatation    Mild by echo 11/2011 (39mm)   BPH (benign prostatic hypertrophy)    Bradycardia    Precluding BB use.   CAD (coronary artery disease)    a. s/p Taxus DES to RCA and PDA in 2006 .  // b. Myoview  8/11: Walked 12:37. EF 57%. no ischemia or scar.  // c. Mod CAD by cath 11/2011 (40-50% ISR of PDA -- for OP myoview  to assess).  //  d. Myoview  3/18: EF 58, no ischemia; Low Risk   Colon polyps    Diverticulitis    GERD (gastroesophageal reflux disease)    Glaucoma    Hyperlipidemia    Hypertension    Sleep apnea    Past Surgical History:  Procedure Laterality Date   ANGIOPLASTY     percutaneous transluminal coronary  angioplasty and stenting of the right coronary artery and posterior    CATARACT EXTRACTION W/ INTRAOCULAR LENS  IMPLANT, BILATERAL  01/2014   INTRAOCULAR LENS INSERTION     LEFT HEART CATH AND CORONARY ANGIOGRAPHY N/A 10/14/2018   Procedure: LEFT HEART CATH AND CORONARY ANGIOGRAPHY;  Surgeon: Jordan, Peter M, MD;  Location: [facility]AS[facility];  Service: Cardiovascular;  Laterality: N/A;   LEFT HEART CATHETERIZATION WITH CORONARY ANGIOGRAM N/A 11/26/2011   Procedure: LEFT HEART CATHETERIZATION WITH CORONARY ANGIOGRAM;  Surgeon: Aleene JINNY Passe, MD;  Location: [facility] CATH LAB;  Service: Cardiovascular;  Laterality: N/A;   LUMBAR LAMINECTOMY/DECOMPRESSION MICRODISCECTOMY Left  05/27/2016   Procedure: Laminectomy for facet/synovial cyst - Lumbar four - Lumbar five - left;  Surgeon: Arley Helling, MD;  Location: [facility] OR;  Service: Neurosurgery;  Laterality: Left;  Laminectomy for facet/synovial cyst - Lumbar four - Lumbar five - left   PILONIDAL CYST / SINUS EXCISION     Patient Active Problem List   Diagnosis Date Noted   Plantar fasciitis 02/25/2024   CAD involving native coronary artery without angina pectoris 10/15/2018   Advance directive discussed with patient 08/08/2014   Dilated aortic root 12/11/2011   Hyperlipidemia 01/09/2007   Essential hypertension 01/09/2007   Allergic rhinitis 01/09/2007   GERD 01/09/2007   Diverticulosis of colon 01/09/2007   Benign enlargement of prostate 01/09/2007   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   History of colonic polyps 01/09/2007    PCP: Jimmy Charlie FERNS, MD REFERRING PROVIDER: Watt Mirza, MD  REFERRING DIAG:  Diagnosis  [phone] (ICD-10-CM) - Postural dizziness with presyncope  R42 (ICD-10-CM) - Dizziness  R27.0 (ICD-10-CM) - Ataxia  R53.1 (ICD-10-CM) - Weakness  Z86.73 (ICD-10-CM) - History of cerebrovascular accident (CVA) due to ischemia    THERAPY DIAG:  Unsteadiness on feet  Difficulty in walking, not elsewhere classified  ONSET DATE: 2024  Rationale for Evaluation and Treatment: Rehabilitation  SUBJECTIVE:  SUBJECTIVE STATEMENT: Pt reports he heard back from cardiology and he will be placed on another monitor, although pt says they don't think there's anything wrong with him in this respect. Pt reports he used to see chiropractor about two years ago to work on neck pain and says it helped. He notes he still does have soreness R shoulder/neck region.   Pt accompanied by:  self  PERTINENT HISTORY:  From evaluation: The patient is a pleasant 84 y/o male here for imbalance. He reports that his balance began changing approximately two years ago and describes the onset as gradual. He explains that  although he does not feel dizzy, he does not walk in a straight line, and his daughter has noticed that he wobbles when walking. He states that within the past year, he traveled to [place] and experienced an episode where he blacked out. Prior to this trip, he wore a heart monitor for one month due to concern for a low heart rate, and then wore an additional monitor, though it didn't show any significant findings. He reports having had an MRI that indicated an old stroke, though he is unsure of when it occurred. 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 He denies current lightheadedness but states that when walking he often has to furniture surf for stability.  Per MD note: ... Syncope 1 year ago.  Fell last week.  Cardiologist is Dr. Alveta.  This has been evaluated by cardiology previously.  Cardiac monitor 1 year ago. No arrhythmia, rare PVC, rare PAC.   Family with concern for potential cognitive changes.  Increased confusion and forgetfulness.   CAD, s/p PCI x 2 several years ago   Family has concerns about walking.  This was previously discussed with his primary care doctor more than a year ago.   Walking not straight Ataxia   H/o syncope with near syncope Heel to toe walking  PMH includes plantar faciitis, CAD, essential HTN, sleep apnea please refer to complete list above  PAIN:  Are you having pain? No  PRECAUTIONS: Fall  RED FLAGS: None   WEIGHT BEARING RESTRICTIONS: No  FALLS: Has patient fallen in last 6 months? None currently, one almost-fall a year ago  LIVING ENVIRONMENT: Lives with: lives alone Lives in: House/apartment Stairs: 2 steps no handrails (says short steps) Has following equipment at home: no equipment  PLOF: Independent  PATIENT GOALS: Improving walking balance  OBJECTIVE:  Note: Objective measures were completed at Evaluation unless otherwise noted.  DIAGNOSTIC FINDINGS:    Via chart MR Brain 05/28/24  IMPRESSION: 1. No acute intracranial abnormality or mass. 2. Cerebral volume is within the expected range for the patient's age. 3. 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   Electronically signed by: Ryan Chess MD 05/28/2024 09:52 AM EST RP Workstation: [id number]    COGNITION: Overall cognitive status: Within functional limits for tasks assessed   SENSATION: Occ numbness in feet says since he has had plantar fasciitis   EDEMA:  None per report   POSTURE:  weight shift right   STRENGTH:   LOWER EXTREMITY MMT:   MMT Right eval Left eval  Hip flexion 5 5  Hip abduction 5 5  Hip adduction 5 5  Hip internal rotation    Hip external rotation    Knee flexion 5 5  Knee extension 5 5  Ankle dorsiflexion 5 5  Ankle plantarflexion    Ankle inversion  Ankle eversion    (Blank rows = not tested)  BED MOBILITY:  Not tested  TRANSFERS: Assistive device utilized: None  Sit to stand: Complete Independence Stand to sit: Complete Independence Chair to chair: Complete Independence  GAIT: Gait pattern: decreased trunk rotation, increased sway (particularly with head turns and to R side), R weight-shift throughout  Distance walked: clinic distances Assistive device utilized: None Level of assistance: occ touching surfaces to steady self   FUNCTIONAL TESTS:     : 1.57 m/s    PATIENT SURVEYS:  06/17/24 ABC scale: 100%    VESTIBULAR ASSESSMENT: deferred  ORTHOSTATICS: to be assessed next 1-2 visits                                                                                                                             TREATMENT DATE: 06/16/24  NMR: gait belt donned throughout  ABC questionnaire - 100% - PT reviewed purpose of questionnaire and results with pt  Slow march ambulation with and without dual cog task over 10 m x multiple rounds - harder with dual cog task, increased step-strategy  Tandem gait  over 10 meters x multiple reps - challenging Comments: pt reports greatest difficulty with walking and talking  Obstacle course with tandem gait, stepping over 2 boxes and two cone taps 2x through. Mild instability, intermittent difficulty with obstacle clearance, but does improve some with reps Ambulation with head turns (mix of horizontal and vertical) reading sticky notes placed on walls - 2x80 ft Ambulation with vertical and horizontal head turns over 10 meters x 4 rounds each SLB with and without UE support 2x30 sec for each condition, practiced on each LE Standing on airex semi-tandem x 30 sec Standing on airex NBOS vertical and horizontal head turns x multiple reps of each - challenging    PATIENT EDUCATION: Education details: exercise technique Person educated: Patient Education method: verbal cues, demonstration Education comprehension: verbalized understanding and returned demonstration  HOME EXERCISE PROGRAM: Head nod and side-to-side Access Code: [email or web address] URL: [email or web address] Date: 06/09/2024 Prepared by: Darryle Patten  Exercises - Standing Corner Balance With [employer] and Chair in [place]  - 1 x daily - 7 x weekly - 2 sets - 1 reps - 30 seconds hold - Standing Single Leg Stance with Counter Support  - 1 x daily - 7 x weekly - 2 sets - 1 reps - 30 seconds hold   SHORT TERM GOALS: Target date: 07/21/2024   Patient will be independent in home exercise program to improve balance and mobility for better functional independence with ADLs. Baseline: initiated  Goal status: INITIAL   LONG TERM GOALS: Target date: 09/01/2024   Patient will increase ABC scale score >80% to demonstrate better functional mobility and better confidence with ADLs.  Baseline: assessed visit 2, pt score 100% (discussed results to confirm) Goal status: INITIAL/DISCONTINUED   2.  Patient will increase Berg Balance score by at least 2 points to demonstrate improved  quiet  balance during functional activities Baseline: 53/56 Goal status: INITIAL  3.  Patient will increase dynamic gait index score to >21/24 as to demonstrate improved dynamic gait balance for better safety with community/home ambulation.   Baseline: 19/24 Goal status: INITIAL  4. The patient will demonstrate ability to ambulate with horizontal head turns and vertical head turns each over 10 meter track with no greater than 3 deviations from ambulating straight path to indicate increased safety with gait activities.  Baseline: impaired, multiple errors  Goal status: INITIAL     ASSESSMENT:  CLINICAL IMPRESSION: Patient is a pleasant 84 y.o. male who was seen today for physical therapy treatment for imbalance. Balance confidence demonstrated not to be a concern as pt scored 100% on ABC questionnaire, confirmed with discussion. However, pt still exhibits both static and dynamic balance deficits: difficulty with head terms, dynamic balance involving NBOS or compliant surfaces. The pt will benefit from further skilled PT to address these impairments in order to improve balance, gait and mobility and safety with activities and ADLs.  OBJECTIVE IMPAIRMENTS: Abnormal gait, decreased balance, decreased coordination, decreased mobility, difficulty walking, impaired sensation, improper body mechanics, and postural dysfunction.   ACTIVITY LIMITATIONS: stairs and locomotion level  PARTICIPATION LIMITATIONS: cleaning, shopping, community activity, and church  PERSONAL FACTORS: Age, Time since onset of injury/illness/exacerbation, and 3+ comorbidities: PMH includes plantar faciitis, CAD, essential HTN, sleep apnea please refer to complete list above are also affecting patient's functional outcome.   REHAB POTENTIAL: Good  CLINICAL DECISION MAKING: Evolving/moderate complexity  EVALUATION COMPLEXITY: Moderate   PLAN:  PT FREQUENCY: 1-2x/week  PT DURATION: 12 weeks  PLANNED INTERVENTIONS: [id number]-  PT Re-evaluation, 97750- Physical Performance Testing, 97110-Therapeutic exercises, 97530- Therapeutic activity, 97112- Neuromuscular re-education, 97535- Self Care, [id number]- Manual therapy, [phone]- Gait training, [phone]- Orthotic Initial, [phone]- Canalith repositioning, Patient/Family education, Balance training, Stair training, Joint mobilization, Spinal mobilization, Vestibular training, DME instructions, Cryotherapy, and Moist heat  PLAN FOR NEXT SESSION: orthostatics, oculomotor screen, possible positional screen if time, otherwise balance training, [facility] to [facility] to [facility], add to HEP as indicated  Darryle Patten PT, DPT   Darryle JONELLE Patten, PT 06/16/2024, 7:17 PM  "

## 2024-06-23 ENCOUNTER — Ambulatory Visit

## 2024-07-07 ENCOUNTER — Ambulatory Visit

## 2024-07-14 ENCOUNTER — Ambulatory Visit

## 2024-07-15 ENCOUNTER — Ambulatory Visit

## 2024-07-21 ENCOUNTER — Ambulatory Visit

## 2024-07-28 ENCOUNTER — Ambulatory Visit

## 2024-08-11 ENCOUNTER — Ambulatory Visit

## 2024-08-18 ENCOUNTER — Ambulatory Visit

## 2024-08-25 ENCOUNTER — Ambulatory Visit

## 2024-09-01 ENCOUNTER — Ambulatory Visit

## 2024-09-08 ENCOUNTER — Ambulatory Visit

## 2024-09-15 ENCOUNTER — Ambulatory Visit

## 2024-09-21 ENCOUNTER — Ambulatory Visit

## 2024-09-22 ENCOUNTER — Ambulatory Visit

## 2024-09-24 ENCOUNTER — Ambulatory Visit

## 2024-09-29 ENCOUNTER — Ambulatory Visit

## 2024-09-30 ENCOUNTER — Ambulatory Visit

## 2024-11-01 ENCOUNTER — Encounter: Admitting: Family Medicine
